# Patient Record
Sex: Male | Born: 1940 | Race: White | Hispanic: No | Marital: Married | Smoking: Never smoker
Health system: Southern US, Community
[De-identification: ages and names within clinical notes are randomized; demographics above are authoritative.]

## PROBLEM LIST (undated history)

## (undated) DIAGNOSIS — E119 Type 2 diabetes mellitus without complications: Secondary | ICD-10-CM

## (undated) DIAGNOSIS — G629 Polyneuropathy, unspecified: Secondary | ICD-10-CM

## (undated) DIAGNOSIS — M5412 Radiculopathy, cervical region: Secondary | ICD-10-CM

## (undated) DIAGNOSIS — I10 Essential (primary) hypertension: Secondary | ICD-10-CM

## (undated) DIAGNOSIS — E039 Hypothyroidism, unspecified: Secondary | ICD-10-CM

## (undated) DIAGNOSIS — M1990 Unspecified osteoarthritis, unspecified site: Secondary | ICD-10-CM

## (undated) DIAGNOSIS — Z9109 Other allergy status, other than to drugs and biological substances: Secondary | ICD-10-CM

## (undated) DIAGNOSIS — N189 Chronic kidney disease, unspecified: Secondary | ICD-10-CM

## (undated) HISTORY — PX: KIDNEY STONE SURGERY: SHX686

## (undated) HISTORY — PX: TONSILLECTOMY: SUR1361

## (undated) HISTORY — PX: COLONOSCOPY W/ POLYPECTOMY: SHX1380

---

## 2000-06-19 ENCOUNTER — Ambulatory Visit (HOSPITAL_COMMUNITY): Admission: RE | Admit: 2000-06-19 | Discharge: 2000-06-19 | Payer: Self-pay | Admitting: *Deleted

## 2004-11-28 ENCOUNTER — Ambulatory Visit: Payer: Self-pay | Admitting: Cardiovascular Disease

## 2004-12-02 ENCOUNTER — Ambulatory Visit: Payer: Self-pay | Admitting: Internal Medicine

## 2004-12-06 ENCOUNTER — Ambulatory Visit: Payer: Self-pay | Admitting: Internal Medicine

## 2010-12-21 ENCOUNTER — Encounter
Admission: RE | Admit: 2010-12-21 | Discharge: 2010-12-21 | Payer: Self-pay | Source: Home / Self Care | Attending: Internal Medicine | Admitting: Internal Medicine

## 2012-02-02 ENCOUNTER — Inpatient Hospital Stay (HOSPITAL_COMMUNITY)
Admission: EM | Admit: 2012-02-02 | Discharge: 2012-02-04 | DRG: 419 | Disposition: A | Discharge status: Home / Self Care | Payer: Medicare Other | Attending: Surgery | Admitting: Surgery

## 2012-02-02 ENCOUNTER — Other Ambulatory Visit: Payer: Self-pay

## 2012-02-02 ENCOUNTER — Encounter (HOSPITAL_COMMUNITY): Payer: Self-pay | Admitting: *Deleted

## 2012-02-02 DIAGNOSIS — Z8719 Personal history of other diseases of the digestive system: Secondary | ICD-10-CM

## 2012-02-02 DIAGNOSIS — I10 Essential (primary) hypertension: Secondary | ICD-10-CM | POA: Diagnosis present

## 2012-02-02 DIAGNOSIS — Z79899 Other long term (current) drug therapy: Secondary | ICD-10-CM

## 2012-02-02 DIAGNOSIS — K8020 Calculus of gallbladder without cholecystitis without obstruction: Secondary | ICD-10-CM

## 2012-02-02 DIAGNOSIS — E876 Hypokalemia: Secondary | ICD-10-CM | POA: Diagnosis present

## 2012-02-02 DIAGNOSIS — Z7982 Long term (current) use of aspirin: Secondary | ICD-10-CM

## 2012-02-02 DIAGNOSIS — K8000 Calculus of gallbladder with acute cholecystitis without obstruction: Principal | ICD-10-CM | POA: Diagnosis present

## 2012-02-02 DIAGNOSIS — Z87442 Personal history of urinary calculi: Secondary | ICD-10-CM

## 2012-02-02 DIAGNOSIS — K859 Acute pancreatitis without necrosis or infection, unspecified: Secondary | ICD-10-CM

## 2012-02-02 HISTORY — DX: Essential (primary) hypertension: I10

## 2012-02-02 LAB — DIFFERENTIAL
Eosinophils Absolute: 0.1 10*3/uL (ref 0.0–0.7)
Eosinophils Relative: 1 % (ref 0–5)
Lymphs Abs: 1.5 10*3/uL (ref 0.7–4.0)
Monocytes Relative: 7 % (ref 3–12)
Neutrophils Relative %: 77 % (ref 43–77)

## 2012-02-02 LAB — CBC
Hemoglobin: 12.9 g/dL — ABNORMAL LOW (ref 13.0–17.0)
MCH: 31.5 pg (ref 26.0–34.0)
MCV: 86.6 fL (ref 78.0–100.0)
RBC: 4.09 MIL/uL — ABNORMAL LOW (ref 4.22–5.81)

## 2012-02-02 MED ORDER — GI COCKTAIL ~~LOC~~
30.0000 mL | Freq: Once | ORAL | Status: AC
  Administered 2012-02-02: 30 mL via ORAL
  Filled 2012-02-02: qty 30

## 2012-02-02 NOTE — ED Notes (Signed)
Pt st's he started having epigastric pain after he ate dinner, around 1800.  Pt st's pain is currently 4/10, no SOB, no nausea, no emesis.  Pt has had diarrhea since Sunday.  Pt in NAD.

## 2012-02-02 NOTE — ED Notes (Signed)
YNW:[id number]<OZ> Expected date:02/02/12<BR> Expected time:10:30 PM<BR> Means of arrival:Ambulance<BR> Comments:<BR> EMS [address] - epigastric pain

## 2012-02-02 NOTE — ED Notes (Signed)
Per EMS:  Pt complaining of epigastric pain, pt has hx of pancreatitis.  Pt started having this pain after dinner, pain radiated to back, no dyspnea, some nausea, no emesis.   Pt describes pain as burning.

## 2012-02-02 NOTE — ED Provider Notes (Signed)
History     CSN: [id number]  Arrival date & time 02/02/12  2249   First MD Initiated Contact with Patient 02/02/12 2324      Chief Complaint  Patient presents with  . Abdominal Pain    (Consider location/radiation/quality/duration/timing/severity/associated sxs/prior treatment) HPI Comments: Pt presents with epigastric pain, starting at 6:30pm tonight, came on suddenly.  Describes as sharp pain.  Was radiating to back.  Was more severe, now mild.  Waxes and wanes. No radiation into chest.  No SOB.  Began after eating a doughnut.  Had hx of gastroenteritis last several days, was better last night and today.  Had "gurgling" feeling to upper abd then, but got better yesterday.  States this feels different than past pancreatitis pain  Patient is a 71 y.o. male presenting with abdominal pain. The history is provided by the patient.  Abdominal Pain The primary symptoms of the illness include abdominal pain and nausea. The primary symptoms of the illness do not include fever, fatigue, shortness of breath, vomiting or diarrhea.  Symptoms associated with the illness do not include chills, diaphoresis, hematuria, frequency or back pain.    Past Medical History  Diagnosis Date  . Hypertension     History reviewed. No pertinent past surgical history.  History reviewed. No pertinent family history.  History  Substance Use Topics  . Smoking status: Not on file  . Smokeless tobacco: Not on file  . Alcohol Use:       Review of Systems  Constitutional: Negative for fever, chills, diaphoresis and fatigue.  HENT: Negative for congestion, rhinorrhea and sneezing.   Eyes: Negative.   Respiratory: Negative for cough, chest tightness and shortness of breath.   Cardiovascular: Negative for chest pain and leg swelling.  Gastrointestinal: Positive for nausea and abdominal pain. Negative for vomiting, diarrhea and blood in stool.  Genitourinary: Negative for frequency, hematuria, flank pain and  difficulty urinating.  Musculoskeletal: Negative for back pain and arthralgias.  Skin: Negative for rash.  Neurological: Negative for dizziness, speech difficulty, weakness, numbness and headaches.    Allergies  Review of patient's allergies indicates no known allergies.  Home Medications   Current Outpatient Rx  Name Route Sig Dispense Refill  . ASPIRIN EC 81 MG PO TBEC Oral Take 81 mg by mouth daily.    Marland Kitchen VALSARTAN-HYDROCHLOROTHIAZIDE 160-12.5 MG PO TABS Oral Take 1 tablet by mouth daily.      BP 149/77  Pulse 100  Temp(Src) 97.6 F (36.4 C) (Oral)  Resp 16  SpO2 98%  Physical Exam  Constitutional: He is oriented to person, place, and time. He appears well-developed and well-nourished.  HENT:  Head: Normocephalic and atraumatic.  Eyes: Pupils are equal, round, and reactive to light.  Neck: Normal range of motion. Neck supple.  Cardiovascular: Normal rate, regular rhythm and normal heart sounds.   Pulmonary/Chest: Effort normal and breath sounds normal. No respiratory distress. He has no wheezes. He has no rales. He exhibits no tenderness.  Abdominal: Soft. Bowel sounds are normal. There is tenderness. There is no rebound and no guarding.       Mild tenderness to epigastric area.  No pain to RUQ  Musculoskeletal: Normal range of motion. He exhibits no edema.  Lymphadenopathy:    He has no cervical adenopathy.  Neurological: He is alert and oriented to person, place, and time.  Skin: Skin is warm and dry. No rash noted.  Psychiatric: He has a normal mood and affect.    ED Course  Procedures (including critical care time)  Results for orders placed during the hospital encounter of 02/02/12  CBC      Component Value Range   WBC 10.0  4.0 - 10.5 (K/uL)   RBC 4.09 (*) 4.22 - 5.81 (MIL/uL)   Hemoglobin 12.9 (*) 13.0 - 17.0 (g/dL)   HCT 40.9 (*) 81.1 - 52.0 (%)   MCV 86.6  78.0 - 100.0 (fL)   MCH 31.5  26.0 - 34.0 (pg)   MCHC 36.4 (*) 30.0 - 36.0 (g/dL)   RDW 91.4   78.2 - 95.6 (%)   Platelets 266  150 - 400 (K/uL)  DIFFERENTIAL      Component Value Range   Neutrophils Relative 77  43 - 77 (%)   Neutro Abs 7.7  1.7 - 7.7 (K/uL)   Lymphocytes Relative 15  12 - 46 (%)   Lymphs Abs 1.5  0.7 - 4.0 (K/uL)   Monocytes Relative 7  3 - 12 (%)   Monocytes Absolute 0.7  0.1 - 1.0 (K/uL)   Eosinophils Relative 1  0 - 5 (%)   Eosinophils Absolute 0.1  0.0 - 0.7 (K/uL)   Basophils Relative 0  0 - 1 (%)   Basophils Absolute 0.0  0.0 - 0.1 (K/uL)  COMPREHENSIVE METABOLIC PANEL      Component Value Range   Sodium 132 (*) 135 - 145 (mEq/L)   Potassium 3.0 (*) 3.5 - 5.1 (mEq/L)   Chloride 96  96 - 112 (mEq/L)   CO2 25  19 - 32 (mEq/L)   Glucose, Bld 158 (*) 70 - 99 (mg/dL)   BUN 25 (*) 6 - 23 (mg/dL)   Creatinine, Ser 2.13 (*) 0.50 - 1.35 (mg/dL)   Calcium 8.9  8.4 - 08.6 (mg/dL)   Total Protein 6.9  6.0 - 8.3 (g/dL)   Albumin 3.3 (*) 3.5 - 5.2 (g/dL)   AST 26  0 - 37 (U/L)   ALT 30  0 - 53 (U/L)   Alkaline Phosphatase 102  39 - 117 (U/L)   Total Bilirubin 0.2 (*) 0.3 - 1.2 (mg/dL)   GFR calc non Af Amer 48 (*) >90 (mL/min)   GFR calc Af Amer 56 (*) >90 (mL/min)  LIPASE, BLOOD      Component Value Range   Lipase 108 (*) 11 - 59 (U/L)  URINALYSIS, ROUTINE W REFLEX MICROSCOPIC      Component Value Range   Color, Urine YELLOW  YELLOW    APPearance CLEAR  CLEAR    Specific Gravity, Urine 1.030  1.005 - 1.030    pH 6.0  5.0 - 8.0    Glucose, UA NEGATIVE  NEGATIVE (mg/dL)   Hgb urine dipstick TRACE (*) NEGATIVE    Bilirubin Urine NEGATIVE  NEGATIVE    Ketones, ur NEGATIVE  NEGATIVE (mg/dL)   Protein, ur 30 (*) NEGATIVE (mg/dL)   Urobilinogen, UA 0.2  0.0 - 1.0 (mg/dL)   Nitrite NEGATIVE  NEGATIVE    Leukocytes, UA NEGATIVE  NEGATIVE   URINE MICROSCOPIC-ADD ON      Component Value Range   WBC, UA 0-2  <3 (WBC/hpf)   RBC / HPF 0-2  <3 (RBC/hpf)    Date: 02/03/2012  Rate: 98  Rhythm: normal sinus rhythm  QRS Axis: normal  Intervals: PR prolonged   ST/T Wave abnormalities: nonspecific ST/T changes  Conduction Disutrbances:first-degree A-V block   Narrative Interpretation:   Old EKG Reviewed: none available   Ct Abdomen Pelvis W Contrast  02/03/2012  *  RADIOLOGY REPORT*  Clinical Data: Upper abdominal pain.  CT ABDOMEN AND PELVIS WITH CONTRAST  Technique:  Multidetector CT imaging of the abdomen and pelvis was performed following the standard protocol during bolus administration of intravenous contrast.  Contrast: OMNIPAQUE IOHEXOL 300 MG/ML IV SOLN  Comparison: None.  Findings: Lung bases are clear.  No effusions.  Heart is normal size.  Small gallstones are seen within the gallbladder.  One of the gallstones appears to be in the gallbladder neck.  Liver, spleen, adrenals are unremarkable.  Small nonobstructing stones in the kidneys bilaterally.  No hydronephrosis or ureteral stones. Urinary bladder is unremarkable.  Pancreatic duct is diffusely dilated, measuring maximally 16 mm in the pancreatic body.  Overlying pancreatic atrophy.  Findings most likely related to prior bouts of pancreatitis.  No changes to suggest acute pancreatitis.  Appendix is visualized and is normal. Bowel grossly unremarkable. No free fluid, free air, or adenopathy.  Bilateral inguinal hernias containing fat.  Degenerative changes in the lower lumbar spine.  No acute bony abnormality.  IMPRESSION: Cholelithiasis.  A small stone is in the region of the gallbladder neck.  Nephrolithiasis.  Changes within the pancreas suggesting prior bouts of pancreatitis. No acute pancreatitis changes.  Original Report Authenticated By: Cyndie Chime, M.D.     1. Gallstone   2. Pancreatitis       MDM  Discussed finding with surgeon, Dr Ezzard Standing, who will admit pt and remove gallbladder        Rolan Bucco, MD 02/03/12 [phone]

## 2012-02-03 ENCOUNTER — Emergency Department (HOSPITAL_COMMUNITY): Payer: Medicare Other | Admitting: Anesthesiology

## 2012-02-03 ENCOUNTER — Encounter (HOSPITAL_COMMUNITY)
Admission: EM | Disposition: A | Discharge status: Home / Self Care | Payer: Self-pay | Source: Home / Self Care | Attending: Surgery

## 2012-02-03 ENCOUNTER — Emergency Department (HOSPITAL_COMMUNITY): Payer: Medicare Other

## 2012-02-03 ENCOUNTER — Encounter (HOSPITAL_COMMUNITY): Payer: Self-pay | Admitting: Anesthesiology

## 2012-02-03 ENCOUNTER — Encounter (HOSPITAL_COMMUNITY): Payer: Self-pay

## 2012-02-03 ENCOUNTER — Other Ambulatory Visit (INDEPENDENT_AMBULATORY_CARE_PROVIDER_SITE_OTHER): Payer: Self-pay | Admitting: Surgery

## 2012-02-03 DIAGNOSIS — K8010 Calculus of gallbladder with chronic cholecystitis without obstruction: Secondary | ICD-10-CM

## 2012-02-03 HISTORY — PX: CHOLECYSTECTOMY: SHX55

## 2012-02-03 LAB — URINALYSIS, ROUTINE W REFLEX MICROSCOPIC
Bilirubin Urine: NEGATIVE
Ketones, ur: NEGATIVE mg/dL
Nitrite: NEGATIVE
Protein, ur: 30 mg/dL — AB
Specific Gravity, Urine: 1.03 (ref 1.005–1.030)
Urobilinogen, UA: 0.2 mg/dL (ref 0.0–1.0)

## 2012-02-03 LAB — COMPREHENSIVE METABOLIC PANEL
Alkaline Phosphatase: 102 U/L (ref 39–117)
BUN: 25 mg/dL — ABNORMAL HIGH (ref 6–23)
GFR calc Af Amer: 56 mL/min — ABNORMAL LOW (ref >90)
GFR calc non Af Amer: 48 mL/min — ABNORMAL LOW (ref >90)
Glucose, Bld: 158 mg/dL — ABNORMAL HIGH (ref 70–99)
Potassium: 3 mEq/L — ABNORMAL LOW (ref 3.5–5.1)
Total Protein: 6.9 g/dL (ref 6.0–8.3)

## 2012-02-03 LAB — URINE MICROSCOPIC-ADD ON

## 2012-02-03 LAB — LIPASE, BLOOD: Lipase: 108 U/L — ABNORMAL HIGH (ref 11–59)

## 2012-02-03 SURGERY — LAPAROSCOPIC CHOLECYSTECTOMY WITH INTRAOPERATIVE CHOLANGIOGRAM
Anesthesia: General | Site: Abdomen | Wound class: Clean Contaminated

## 2012-02-03 MED ORDER — LACTATED RINGERS IV SOLN
INTRAVENOUS | Status: DC | PRN
  Administered 2012-02-03 (×2): via INTRAVENOUS

## 2012-02-03 MED ORDER — IOHEXOL 300 MG/ML  SOLN
100.0000 mL | Freq: Once | INTRAMUSCULAR | Status: AC | PRN
  Administered 2012-02-03: 100 mL via INTRAVENOUS

## 2012-02-03 MED ORDER — ACETAMINOPHEN 10 MG/ML IV SOLN
INTRAVENOUS | Status: AC
  Filled 2012-02-03: qty 100

## 2012-02-03 MED ORDER — ONDANSETRON HCL 4 MG/2ML IJ SOLN
INTRAMUSCULAR | Status: DC | PRN
  Administered 2012-02-03: 4 mg via INTRAVENOUS

## 2012-02-03 MED ORDER — MORPHINE SULFATE 4 MG/ML IJ SOLN
4.0000 mg | Freq: Once | INTRAMUSCULAR | Status: AC
  Administered 2012-02-03: 4 mg via INTRAVENOUS
  Filled 2012-02-03: qty 1

## 2012-02-03 MED ORDER — ROCURONIUM BROMIDE 100 MG/10ML IV SOLN
INTRAVENOUS | Status: DC | PRN
  Administered 2012-02-03: 25 mg via INTRAVENOUS

## 2012-02-03 MED ORDER — ONDANSETRON HCL 4 MG/2ML IJ SOLN
4.0000 mg | Freq: Once | INTRAMUSCULAR | Status: AC
  Administered 2012-02-03: 4 mg via INTRAVENOUS
  Filled 2012-02-03: qty 2

## 2012-02-03 MED ORDER — HYDROMORPHONE HCL PF 1 MG/ML IJ SOLN: 0.2500 mg | INTRAMUSCULAR | Status: DC | PRN

## 2012-02-03 MED ORDER — ONDANSETRON HCL 4 MG/2ML IJ SOLN
4.0000 mg | Freq: Four times a day (QID) | INTRAMUSCULAR | Status: DC | PRN
  Administered 2012-02-03 (×2): 4 mg via INTRAVENOUS
  Filled 2012-02-03: qty 2

## 2012-02-03 MED ORDER — ACETAMINOPHEN 10 MG/ML IV SOLN
INTRAVENOUS | Status: DC | PRN
  Administered 2012-02-03: 1000 mg via INTRAVENOUS

## 2012-02-03 MED ORDER — PROMETHAZINE HCL 25 MG/ML IJ SOLN: 6.2500 mg | INTRAMUSCULAR | Status: DC | PRN

## 2012-02-03 MED ORDER — ACETAMINOPHEN 325 MG PO TABS: 650.0000 mg | ORAL_TABLET | ORAL | Status: DC | PRN

## 2012-02-03 MED ORDER — CEFAZOLIN SODIUM 1-5 GM-% IV SOLN
1.0000 g | Freq: Four times a day (QID) | INTRAVENOUS | Status: AC
  Administered 2012-02-03 – 2012-02-04 (×3): 1 g via INTRAVENOUS
  Filled 2012-02-03 (×3): qty 50

## 2012-02-03 MED ORDER — EPHEDRINE SULFATE 50 MG/ML IJ SOLN
INTRAMUSCULAR | Status: DC | PRN
  Administered 2012-02-03: 5 mg via INTRAVENOUS

## 2012-02-03 MED ORDER — POTASSIUM CHLORIDE CRYS ER 20 MEQ PO TBCR
40.0000 meq | EXTENDED_RELEASE_TABLET | Freq: Once | ORAL | Status: AC
  Administered 2012-02-03: 40 meq via ORAL
  Filled 2012-02-03: qty 2

## 2012-02-03 MED ORDER — PROPOFOL 10 MG/ML IV EMUL
INTRAVENOUS | Status: DC | PRN
  Administered 2012-02-03: 120 mg via INTRAVENOUS

## 2012-02-03 MED ORDER — IOHEXOL 300 MG/ML  SOLN
INTRAMUSCULAR | Status: DC | PRN
  Administered 2012-02-03: 50 mL

## 2012-02-03 MED ORDER — SODIUM CHLORIDE 0.9 % IR SOLN
Status: DC | PRN
  Administered 2012-02-03: 1000 mL

## 2012-02-03 MED ORDER — MORPHINE SULFATE 2 MG/ML IJ SOLN: 1.0000 mg | INTRAMUSCULAR | Status: DC | PRN

## 2012-02-03 MED ORDER — SODIUM CHLORIDE 0.9 % IV BOLUS (SEPSIS)
500.0000 mL | Freq: Once | INTRAVENOUS | Status: AC
  Administered 2012-02-03: 500 mL via INTRAVENOUS

## 2012-02-03 MED ORDER — CEFAZOLIN SODIUM 1-5 GM-% IV SOLN
INTRAVENOUS | Status: DC | PRN
  Administered 2012-02-03: 1 g via INTRAVENOUS

## 2012-02-03 MED ORDER — BUPIVACAINE HCL (PF) 0.25 % IJ SOLN
INTRAMUSCULAR | Status: AC
  Filled 2012-02-03: qty 30

## 2012-02-03 MED ORDER — BUPIVACAINE HCL (PF) 0.25 % IJ SOLN
INTRAMUSCULAR | Status: DC | PRN
  Administered 2012-02-03: 22 mL

## 2012-02-03 MED ORDER — NEOSTIGMINE METHYLSULFATE 1 MG/ML IJ SOLN
INTRAMUSCULAR | Status: DC | PRN
  Administered 2012-02-03: 3.5 mg via INTRAVENOUS

## 2012-02-03 MED ORDER — IOHEXOL 300 MG/ML  SOLN
INTRAMUSCULAR | Status: AC
  Filled 2012-02-03: qty 1

## 2012-02-03 MED ORDER — FENTANYL CITRATE 0.05 MG/ML IJ SOLN
INTRAMUSCULAR | Status: DC | PRN
  Administered 2012-02-03 (×2): 50 ug via INTRAVENOUS
  Administered 2012-02-03: 100 ug via INTRAVENOUS

## 2012-02-03 MED ORDER — POTASSIUM CHLORIDE IN NACL 20-0.45 MEQ/L-% IV SOLN
INTRAVENOUS | Status: DC
  Administered 2012-02-03 – 2012-02-04 (×2): via INTRAVENOUS
  Filled 2012-02-03 (×7): qty 1000

## 2012-02-03 MED ORDER — ONDANSETRON HCL 4 MG PO TABS: 4.0000 mg | ORAL_TABLET | Freq: Four times a day (QID) | ORAL | Status: DC | PRN

## 2012-02-03 MED ORDER — LIDOCAINE HCL (CARDIAC) 20 MG/ML IV SOLN
INTRAVENOUS | Status: DC | PRN
  Administered 2012-02-03: 60 mg via INTRAVENOUS

## 2012-02-03 MED ORDER — VALSARTAN-HYDROCHLOROTHIAZIDE 160-12.5 MG PO TABS: 1.0000 | ORAL_TABLET | Freq: Every day | ORAL | Status: DC

## 2012-02-03 MED ORDER — HEPARIN SODIUM (PORCINE) 5000 UNIT/ML IJ SOLN
5000.0000 [IU] | Freq: Three times a day (TID) | INTRAMUSCULAR | Status: DC
  Administered 2012-02-03 – 2012-02-04 (×2): 5000 [IU] via SUBCUTANEOUS
  Filled 2012-02-03 (×5): qty 1

## 2012-02-03 MED ORDER — GLYCOPYRROLATE 0.2 MG/ML IJ SOLN
INTRAMUSCULAR | Status: DC | PRN
  Administered 2012-02-03: .5 mg via INTRAVENOUS

## 2012-02-03 MED ORDER — SUCCINYLCHOLINE CHLORIDE 20 MG/ML IJ SOLN
INTRAMUSCULAR | Status: DC | PRN
  Administered 2012-02-03: 100 mg via INTRAVENOUS

## 2012-02-03 MED ORDER — HYDROCODONE-ACETAMINOPHEN 5-325 MG PO TABS: 1.0000 | ORAL_TABLET | ORAL | Status: DC | PRN

## 2012-02-03 MED ORDER — LACTATED RINGERS IR SOLN
Status: DC | PRN
  Administered 2012-02-03: 1000 mL

## 2012-02-03 MED ORDER — ONDANSETRON HCL 4 MG/2ML IJ SOLN
INTRAMUSCULAR | Status: AC
  Filled 2012-02-03: qty 2

## 2012-02-03 MED ORDER — HYDROCHLOROTHIAZIDE 12.5 MG PO CAPS
12.5000 mg | ORAL_CAPSULE | Freq: Every day | ORAL | Status: DC
  Administered 2012-02-03: 12.5 mg via ORAL
  Filled 2012-02-03 (×2): qty 1

## 2012-02-03 MED ORDER — CEFAZOLIN SODIUM 1-5 GM-% IV SOLN
INTRAVENOUS | Status: AC
  Filled 2012-02-03: qty 50

## 2012-02-03 MED ORDER — KETOROLAC TROMETHAMINE 30 MG/ML IJ SOLN: 15.0000 mg | Freq: Once | INTRAMUSCULAR | Status: DC | PRN

## 2012-02-03 MED ORDER — IRBESARTAN 150 MG PO TABS
150.0000 mg | ORAL_TABLET | Freq: Every day | ORAL | Status: DC
  Administered 2012-02-03: 150 mg via ORAL
  Filled 2012-02-03 (×2): qty 1

## 2012-02-03 SURGICAL SUPPLY — 48 items
ADH SKN CLS APL DERMABOND .7 (GAUZE/BANDAGES/DRESSINGS)
APL SKNCLS STERI-STRIP NONHPOA (GAUZE/BANDAGES/DRESSINGS)
APPLIER CLIP ROT 10 11.4 M/L (STAPLE) ×2
APR CLP MED LRG 11.4X10 (STAPLE) ×1
BAG SPEC RTRVL LRG 6X4 10 (ENDOMECHANICALS) ×1
BENZOIN TINCTURE PRP APPL 2/3 (GAUZE/BANDAGES/DRESSINGS) ×1 IMPLANT
CABLE HI FREQUENCY MONOPOLAR (ELECTROSURGICAL) ×1 IMPLANT
CANISTER SUCTION 2500CC (MISCELLANEOUS) ×2 IMPLANT
CHLORAPREP W/TINT 26ML (MISCELLANEOUS) ×2 IMPLANT
CHOLANGIOGRAM CATH TAUT (CATHETERS) ×2 IMPLANT
CLIP APPLIE ROT 10 11.4 M/L (STAPLE) ×1 IMPLANT
CLOTH BEACON ORANGE TIMEOUT ST (SAFETY) ×2 IMPLANT
COVER MAYO STAND STRL (DRAPES) ×1 IMPLANT
DECANTER SPIKE VIAL GLASS SM (MISCELLANEOUS) ×2 IMPLANT
DERMABOND ADVANCED (GAUZE/BANDAGES/DRESSINGS)
DERMABOND ADVANCED .7 DNX12 (GAUZE/BANDAGES/DRESSINGS) IMPLANT
DRAPE C-ARM 42X72 X-RAY (DRAPES) ×1 IMPLANT
DRAPE LAPAROSCOPIC ABDOMINAL (DRAPES) ×2 IMPLANT
ELECT REM PT RETURN 9FT ADLT (ELECTROSURGICAL) ×2
ELECTRODE REM PT RTRN 9FT ADLT (ELECTROSURGICAL) ×1 IMPLANT
GLOVE BIOGEL PI IND STRL 7.0 (GLOVE) ×1 IMPLANT
GLOVE BIOGEL PI INDICATOR 7.0 (GLOVE) ×1
GLOVE SURG SIGNA 7.5 PF LTX (GLOVE) ×2 IMPLANT
GOWN STRL NON-REIN LRG LVL3 (GOWN DISPOSABLE) ×2 IMPLANT
GOWN STRL REIN XL XLG (GOWN DISPOSABLE) ×4 IMPLANT
HEMOSTAT SURGICEL 4X8 (HEMOSTASIS) ×1 IMPLANT
IV CATH 14GX2 1/4 (CATHETERS) ×2 IMPLANT
IV SET EXT 30 76VOL 4 MALE LL (IV SETS) ×2 IMPLANT
KIT BASIN OR (CUSTOM PROCEDURE TRAY) ×2 IMPLANT
NS IRRIG 1000ML POUR BTL (IV SOLUTION) IMPLANT
POUCH SPECIMEN RETRIEVAL 10MM (ENDOMECHANICALS) ×1 IMPLANT
SCISSORS MNPLR CVD DVNC (INSTRUMENTS) IMPLANT
SCISSORS MONOPOLAR CVD (INSTRUMENTS) ×1
SET IRRIG TUBING LAPAROSCOPIC (IRRIGATION / IRRIGATOR) ×2 IMPLANT
SLEEVE Z-THREAD 5X100MM (TROCAR) IMPLANT
SOLUTION ANTI FOG 6CC (MISCELLANEOUS) ×2 IMPLANT
SOLUTION ELECTROLUBE (MISCELLANEOUS) ×1 IMPLANT
STOPCOCK K 69 2C6206 (IV SETS) ×2 IMPLANT
STRIP CLOSURE SKIN 1/4X4 (GAUZE/BANDAGES/DRESSINGS) ×1 IMPLANT
SUT VIC AB 5-0 PS2 18 (SUTURE) ×2 IMPLANT
TOWEL OR 17X26 10 PK STRL BLUE (TOWEL DISPOSABLE) ×6 IMPLANT
TRAY LAP CHOLE (CUSTOM PROCEDURE TRAY) ×2 IMPLANT
TROCAR XCEL BLUNT TIP 100MML (ENDOMECHANICALS) ×2 IMPLANT
TROCAR Z-THREAD FIOS 11X100 BL (TROCAR) ×2 IMPLANT
TROCAR Z-THREAD FIOS 5X100MM (TROCAR) ×3 IMPLANT
TROCAR Z-THREAD SLEEVE 11X100 (TROCAR) ×1 IMPLANT
TUBING INSUFFLATION 10FT LAP (TUBING) ×2 IMPLANT
WATER STERILE IRR 1500ML POUR (IV SOLUTION) ×2 IMPLANT

## 2012-02-03 NOTE — ED Notes (Signed)
Introduced self to patient  Alert oriented x3.  Getting ready to transfer to OR, chg bath done. No complaints at this time.

## 2012-02-03 NOTE — ED Notes (Signed)
Pt brought over from ED and placed in room 31 and made comfortable with wife at bedside awaiting surgery to consult.

## 2012-02-03 NOTE — Preoperative (Signed)
Beta Blockers   Reason not to administer Beta Blockers:Not Applicable 

## 2012-02-03 NOTE — Transfer of Care (Signed)
Immediate Anesthesia Transfer of Care Note  Patient: Terrence Cisneros  Procedure(s) Performed: Procedure(s) (LRB): LAPAROSCOPIC CHOLECYSTECTOMY WITH INTRAOPERATIVE CHOLANGIOGRAM (N/A)  Patient Location: PACU  Anesthesia Type: General  Level of Consciousness: oriented, sedated, patient cooperative and responds to stimulation  Airway & Oxygen Therapy: Patient Spontanous Breathing and Patient connected to face mask oxygen  Post-op Assessment: Report given to PACU RN, Post -op Vital signs reviewed and stable and Patient moving all extremities  Post vital signs: Reviewed and stable  Complications: No apparent anesthesia complications

## 2012-02-03 NOTE — Anesthesia Postprocedure Evaluation (Signed)
  Anesthesia Post-op Note  Patient: Terrence Cisneros  Procedure(s) Performed: Procedure(s) (LRB): LAPAROSCOPIC CHOLECYSTECTOMY WITH INTRAOPERATIVE CHOLANGIOGRAM (N/A)  Patient Location: PACU  Anesthesia Type: General  Level of Consciousness: awake, alert , oriented, patient cooperative and responds to stimulation  Airway and Oxygen Therapy: Patient Spontanous Breathing and Patient connected to nasal cannula oxygen  Post-op Pain: mild  Post-op Assessment: Post-op Vital signs reviewed, Patient's Cardiovascular Status Stable, Respiratory Function Stable, Patent Airway, No signs of Nausea or vomiting, Adequate PO intake and Pain level controlled  Post-op Vital Signs: Reviewed and stable  Complications: No apparent anesthesia complications

## 2012-02-03 NOTE — Brief Op Note (Signed)
02/02/2012 - 02/03/2012  9:32 AM  PATIENT:  Alwyn Pea, 71 y.o., male, MRN: [id number]  PREOP DIAGNOSIS:  gallstones  POSTOP DIAGNOSIS:   Acute cholecystitis, cholelithiasis.  PROCEDURE:   Procedure(s): LAPAROSCOPIC CHOLECYSTECTOMY WITH INTRAOPERATIVE CHOLANGIOGRAM  SURGEON:   Ovidio Kin, M.D.  ASSISTANTJohna Sheriff, M.D>  ANESTHESIA:   general  Riesa Pope, MD - Anesthesiologist Zebedee Iba, CRNA - CRNA  General  EBL:  75  ml  BLOOD ADMINISTERED: none  DRAINS: none   LOCAL MEDICATIONS USED:   25 cc 1/4% marcaine  SPECIMEN:   Gall bladder  COUNTS CORRECT:  YES  INDICATIONS FOR PROCEDURE:  KIEL COCKERELL is a 71 y.o. (DOB: 1941-03-12) white male whose primary care physician is No primary provider on file. and comes for lap chole with IOC.  He came through the [facility].   The indications and risks of the surgery were explained to the patient.  The risks include, but are not limited to, infection, bleeding, and nerve injury.  Note dictated to:   [phone]

## 2012-02-03 NOTE — Anesthesia Preprocedure Evaluation (Addendum)
Anesthesia Evaluation  Patient identified by MRN, date of birth, ID band Patient awake    Reviewed: Allergy & Precautions, H&P , NPO status , Patient's Chart, lab work & pertinent test results  Airway Mallampati: III TM Distance: <3 FB Neck ROM: Full    Dental No notable dental hx. (+) Caps,    Pulmonary neg pulmonary ROS,  clear to auscultation  Pulmonary exam normal       Cardiovascular hypertension, Pt. on medications Regular Normal    Neuro/Psych Negative Neurological ROS  Negative Psych ROS   GI/Hepatic negative GI ROS, Neg liver ROS,   Endo/Other  Negative Endocrine ROS  Renal/GU negative Renal ROS  Genitourinary negative   Musculoskeletal negative musculoskeletal ROS (+)   Abdominal   Peds negative pediatric ROS (+)  Hematology negative hematology ROS (+)   Anesthesia Other Findings   Reproductive/Obstetrics negative OB ROS                          Anesthesia Physical Anesthesia Plan  ASA: II  Anesthesia Plan: General   Post-op Pain Management:    Induction: Intravenous  Airway Management Planned: Oral ETT  Additional Equipment:   Intra-op Plan:   Post-operative Plan: Extubation in OR  Informed Consent: I have reviewed the patients History and Physical, chart, labs and discussed the procedure including the risks, benefits and alternatives for the proposed anesthesia with the patient or authorized representative who has indicated his/her understanding and acceptance.   Dental advisory given  Plan Discussed with: CRNA  Anesthesia Plan Comments:         Anesthesia Quick Evaluation

## 2012-02-03 NOTE — H&P (Signed)
Re:   KENT RIENDEAU DOB:   Jan 12, 1941 MRN:   [id number]  ASSESSMENT AND PLAN: 1.  Symptomatic gallstones.  I discussed with the patient the indications and risks of gall bladder surgery.  The primary risks of gall bladder surgery include, but are not limited to, bleeding, infection, common bile duct injury, and open surgery.  There is also the risk that the patient may have continued symptoms after surgery.  However, the likelihood of improvement in symptoms and return to the patient's normal status is good. We discussed the typical post-operative recovery course. I tried to answer the patient's questions.  I drew a picture of the surgery for the patient.  His wife was at the bed side with him.  Will proceed with surgery this AM.  2.  History of pancreatitis.  Hospitalized in [place].  Etiology never found 3.  Hypertension x 2 years. 4.  History of kidney stones.  Creatinine - 1.43. 5.  Hypokalemia.  To replace.  Chief Complaint  Patient presents with  . Abdominal Pain   REFERRING PHYSICIAN:  Dr. Fredderick Phenix, Janyce Llanos  HISTORY OF PRESENT ILLNESS: Terrence Cisneros is a 71 y.o. (DOB: 1941-03-01)  white male whose primary care physician is Dr. Burney Gauze and comes to [facility] with abdominal pain.  The patient was hospitalized in [place] for pancreatitis in 2011.  The cause of the pancreatitis was not discovered .  He does not drink.  He has had the intestinal flu the past week, but was getting better.  He then developed acute epigastric pain at 6:30 PM yesterday and was taken by ambulance from [facility] to [facility].  Dr. Fredderick Phenix called me in consultation.  Past Medical History  Diagnosis Date  . Hypertension      History reviewed. No pertinent past surgical history.    Current Facility-Administered Medications  Medication Dose Route Frequency Provider Last Rate Last Dose  . gi cocktail  30 mL Oral Once Rolan Bucco, MD   30 mL at 02/02/12 2354  . iohexol (OMNIPAQUE) 300 MG/ML solution 100 mL   100 mL Intravenous Once PRN Medication Radiologist, MD   100 mL at 02/03/12 0324  . morphine 4 MG/ML injection 4 mg  4 mg Intravenous Once Rolan Bucco, MD   4 mg at 02/03/12 0058  . ondansetron (ZOFRAN) injection 4 mg  4 mg Intravenous Once Rolan Bucco, MD   4 mg at 02/03/12 0058  . potassium chloride SA (K-DUR,KLOR-CON) CR tablet 40 mEq  40 mEq Oral Once Rolan Bucco, MD   40 mEq at 02/03/12 0158  . sodium chloride 0.9 % bolus 500 mL  500 mL Intravenous Once Rolan Bucco, MD   500 mL at 02/03/12 7829   Current Outpatient Prescriptions  Medication Sig Dispense Refill  . aspirin EC 81 MG tablet Take 81 mg by mouth daily.      . valsartan-hydrochlorothiazide (DIOVAN-HCT) 160-12.5 MG per tablet Take 1 tablet by mouth daily.         No Known Allergies  REVIEW OF SYSTEMS: Skin:  No history of rash.  No history of abnormal moles. Infection:  No history of hepatitis or HIV.  No history of MRSA. Neurologic:  No history of stroke.  No history of seizure.  No history of headaches. Cardiac:  Hypertension x 2 years. No history of heart disease.  No history of prior cardiac catheterization.  No history of seeing a cardiologist. Pulmonary:  Does not smoke cigarettes.  No asthma or bronchitis.  No OSA/CPAP.  Endocrine:  No diabetes. No thyroid disease. Gastrointestinal: See HPI. Urologic: History of kidney stones. Musculoskeletal:  No history of joint or back disease. Hematologic:  No bleeding disorder.  No history of anemia.  Not anticoagulated. Psycho-social:  The patient is oriented.   The patient has no obvious psychologic or social impairment to understanding our conversation and plan.  SOCIAL and FAMILY HISTORY: Married.  Wife at bedside. Works in a garage. Has 2 children.  PHYSICAL EXAM: BP 124/77  Pulse 77  Temp(Src) 98.7 F (37.1 C) (Oral)  Resp 18  SpO2 100%  General: WN older WM who is alert and generally healthy appearing.  HEENT: Normal. Pupils equal. Good  dentition. Neck: Supple. No mass.  No thyroid mass.  Carotid pulse okay with no bruit. Lymph Nodes:  No supraclavicular or cervical nodes. Lungs: Clear to auscultation and symmetric breath sounds. Heart:  RRR. No murmur or rub.  Abdomen: Soft. No mass. No tenderness at this time. No hernia. Normal bowel sounds.  No abdominal scars. Rectal: Not done. Extremities:  Good strength and ROM  in upper and lower extremities. Neurologic:  Grossly intact to motor and sensory function. Psychiatric: Has normal mood and affect. Behavior is normal.   DATA REVIEWED: CT scan shows small stones, ? Impacted in neck of GB. WBC - 10,000 Lipase - 108 K+ - 3.0  Ovidio Kin, MD,  [facility], [place] [address] [place]  [address]   [place], [place]    [address] Phone:  [phone] FAX:  [phone]

## 2012-02-04 MED ORDER — HYDROCODONE-ACETAMINOPHEN 5-325 MG PO TABS: 1.0000 | ORAL_TABLET | Freq: Four times a day (QID) | ORAL | Status: AC | PRN

## 2012-02-04 NOTE — Progress Notes (Signed)
Assessment unchanged. Pt and wife verbalized understanding of dc instructions. Script x1 with pt as provided by MD. Discharged via wheelchair to front entrance to meet awaiting vehicle to carry home. Accompanied by wife and nurse tech.

## 2012-02-04 NOTE — Discharge Instructions (Signed)
DISCHARGE INSTRUCTIONS TO PATIENT  Return to work on:  1 - 2 weeks  Activity:  Driving - may drive in 3 to 4 days if doing well.   Lifting - no lifting >15 pounds for about 1 week  Wound Care:   May shower starting tomorrow  Diet:  Low fat  Follow up appointment:  Call Dr. Allene Pyo office [facility]) at [phone] for an appointment in 2 - 3 weeks.  Medications and dosages:  Resume your home medications.  You have a prescription for:  Vicodin  Call Dr. Ezzard Standing or his office  [phone]) if you have:  Temperature greater than 100.4,  Persistent nausea and vomiting,  Severe uncontrolled pain,  Redness, tenderness, or signs of infection (pain, swelling, redness, odor or green/yellow discharge around the site),  Difficulty breathing, headache or visual disturbances,  Any other questions or concerns you may have after discharge.  In an emergency, call 911 or go to an Emergency Department at a nearby hospital.

## 2012-02-04 NOTE — Op Note (Signed)
NAMEMarland Kitchen  ALAN, DRUMMER NO.:  [id number]  MEDICAL RECORD NO.:  [id number]  LOCATION:  1523                         FACILITY:  [facility]  PHYSICIAN:  Sandria Bales. Ezzard Standing, M.D.  DATE OF BIRTH:  1941-08-13  DATE OF PROCEDURE:  02/03/2012                              OPERATIVE REPORT  PREOPERATIVE DIAGNOSES: 1. Cholecystitis. 2. Cholelithiasis.  POSTOPERATIVE DIAGNOSES: 1. Acute cholecystitis. 2. Cholelithiasis.  PROCEDURES:  Laparoscopic cholecystectomy with intraoperative cholangiogram.  SURGEONS:  Sandria Bales. Ezzard Standing, M.D.  FIRST ASSISTANT:  Sharlet Salina T. Hoxworth, M.D.  ANESTHESIA:  General endotracheal.  ESTIMATED BLOOD LOSS:  75 mL.  DRAINS:  Left in were none.  INDICATION OF PROCEDURE:  Mr. Delange is a 71 year old white male, who sees Dr. Georgann Housekeeper, his primary medical doctor.  He had a pancreatitis, hospitalized in [place] about 2 years ago, with not clear diagnosis.  He presented acutely last evening to the [facility] Emergency Room via EMS with epigastric abdominal pain.  Evaluation by Dr. Fredderick Phenix revealed he had cholelithiasis with apparent stones on the neck of his gallbladder.  Discussed with the patient and his wife about proceeding with laparoscopic cholecystectomy.  I discussed the indications, potential risks of surgery.  Potential risks of surgery include but are not limited to bleeding, infection, bowel injury, and open surgery.  OPERATIVE NOTE:  The patient placed in room #1 under general anesthesia supervised by Dr. Eilene Ghazi.  He was given 1 g of Ancef at the initiation of procedure.  He had his abdomen prepped with ChloraPrep and sterilely draped.  Time-out was held, the surgical checklist run.  I accessed the abdominal cavity through an infraumbilical incision with sharp dissection carried down to the abdominal cavity.  A 0 degree 10 mm laparoscope was inserted through a 12 mm Hasson trocar and the Hasson trocar secured with a 0  Vicryl suture.  I then switched to an angled scope to better go look at the gallbladder.  His liver edge rather angling down towards right shoulder and the gallbladder was high.  I placed 3 additional trocars, a 11 mm subxiphoid trocar, 5 mm right mid subcostal and 5 mm lateral subcostal trocar.  The gallbladder was somewhat tensed and distended.  I decompressed the gall bladder.  I grabbed the gallbladder and rotated it cephalad.  I did see he had a lot of fat around the lower pole of the gallbladder with evidence of edema, all consistent with acute cholecystitis.  I dissected down and identified the cystic duct, gallbladder junction and the cholecystic artery.  I placed a clip on the cystic duct, 3 clips on the cystic artery and shot an intraoperative cholangiogram.  Intraoperative cholangiogram was shot using a cutoff taut catheter, inserted through a 14-gauge Jelco into the side of the cut cystic duct and secured with an Endoclip.  I used about 8 mL of half-strength Hypaque solution, which showed free flow down the cystic duct, into the common bile duct, which was small.  I went up to hepatic radicals and easily flowed into the duodenum, which looked like the duct into the third or fourth portion of the duodenum.  I then removed the taut catheter, Endoclipped the  cystic duct 3 times, and divided the cystic artery, then sharply and bluntly dissected the gallbladder from the gallbladder bed.  He had a venous lake, which bled a fair amount about two-thirds of the way in the gallbladder bed.  I controlled this with the cautery.  After I controlled this, I irrigated the bed again multiple times, to make sure there is no more bleeding.  I did place about a two-thirds of a piece of Surgicel in the gallbladder bed and again watched this for some 10 to 15 minutes, which there was no further bleeding.  I irrigated with about 1500 mL of saline. The gallbladder is placed in EndoCatch bag,  delivered through the umbilicus.  I again rechecked for bleeding of which there was none.  The umbilical port was closed with 0 Vicryl suture.  The skin at each port was closed with 5-0 Vicryl suture, painted with Dermabond.    The patient was transported to the recovery room in good condition.  Sponge and needle counts were correct.   Sandria Bales. Ezzard Standing, M.D., FACS   DHN/MEDQ  D:  02/03/2012  T:  02/04/2012  Job:  [id number]  cc:   Georgann Housekeeper, MD Fax: [phone]

## 2012-02-04 NOTE — Progress Notes (Signed)
POD #1  Re: [name]  DOB: 1941-09-11  MRN: [id number]  ASSESSMENT AND PLAN:   1. Symptomatic gallstones.   Lap chole - 02/03/2012 - D. Zavior Thomason Has done well.  Wife in room. Ready for discharge.  2. History of pancreatitis.   Hospitalized in [place].  3. Hypertension x 2 years.  4. History of kidney stones.   Creatinine - 1.43.  Had BM Wounds look good. Discharge instructions reviewed.  D/C dictated - #[id number].  Ovidio Kin, MD, [facility] Pager: [phone] Office phone:  [phone]

## 2012-02-05 NOTE — Discharge Summary (Signed)
NAMEMarland Cisneros  KAEGAN, HETTICH NO.:  [id number]  MEDICAL RECORD NO.:  [id number]  LOCATION:  1523                         FACILITY:  [facility]  PHYSICIAN:  Sandria Bales. Ezzard Standing, M.D.  DATE OF BIRTH:  21-Aug-1941  DATE OF ADMISSION:  02/02/2012 DATE OF DISCHARGE:  02/04/2012                              DISCHARGE SUMMARY  DISCHARGE DIAGNOSES: 1. Acute cholecystitis and cholelithiasis. 2. History of pancreatitis, hospitalized in [place] in 2011. 3. Hypertension. 4. History of kidney stones.  OPERATION PERFORMED:  The patient underwent a laparoscopic cholecystectomy with intraoperative cholangiogram by Dr. Ovidio Kin on February 03, 2012.  HISTORY OF PRESENT ILLNESS:  [name] is a 71 year old white male who sees Dr. Georgann Housekeeper as his primary medical doctor and presented to the [facility] Emergency Room with acute abdominal pain.  Evaluation revealed cholelithiasis.  He was taken to the operating room on the day of admission where he underwent a     laparoscopic cholecystectomy with cholangiogram for acute cholecystitis and cholelithiasis.  His significant past history as he was hospitalized for pancreatitis in [place] in 2011.  The etiology was never found, but in retrospect this may have very well been biliary related. He has a history of Hypertension.  And he has a history of kidney stones.  HOSPITAL COURSE:  After the laparoscopic cholecystectomy, the patient did well and he is now 1 day postoperative.  He is tolerating a diet, had a bowel movement, feels better and he is ready for discharge.  DISCHARGE INSTRUCTIONS:  Include being on a low-fat diet.  He should not drive for 3 to 4 days. He will take it easy for 1 to 2 weeks.  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 I have given him Vicodin for pain and he can resume his home medication. He is to see me back in 2 weeks for followup.  He knows to call for any problems. His wife was in the room during the  discussion.   Sandria Bales. Ezzard Standing, M.D.FACS   DHN/MEDQ  D:  02/04/2012  T:  02/05/2012  Job:  [id number]  cc:   Georgann Housekeeper, MD Fax: [phone]

## 2012-02-12 ENCOUNTER — Encounter (HOSPITAL_COMMUNITY): Payer: Self-pay | Admitting: Surgery

## 2012-02-22 ENCOUNTER — Ambulatory Visit (INDEPENDENT_AMBULATORY_CARE_PROVIDER_SITE_OTHER): Payer: Medicare Other | Admitting: Surgery

## 2012-02-22 ENCOUNTER — Encounter (INDEPENDENT_AMBULATORY_CARE_PROVIDER_SITE_OTHER): Payer: Self-pay | Admitting: Surgery

## 2012-02-22 VITALS — BP 138/70 | HR 72 | Temp 98.8°F | Resp 18 | Ht 70.0 in | Wt 166.0 lb

## 2012-02-22 DIAGNOSIS — Z9049 Acquired absence of other specified parts of digestive tract: Secondary | ICD-10-CM | POA: Insufficient documentation

## 2012-02-22 DIAGNOSIS — Z9089 Acquired absence of other organs: Secondary | ICD-10-CM

## 2012-02-22 NOTE — Progress Notes (Signed)
[facility]  Terrence Kin, MD,  FACS [address] [place]  [address] [place], [place]    [address] Phone:  [phone] FAX:  [phone]   Re:   Terrence Cisneros DOB:   1941/10/16 MRN:   [id number]  ASSESSMENT AND PLAN: 1.  S/P lap chole with IOC - 02/03/2012.  Doing well.  Looks good and is very Adult nurse.  Return to office prn.  2. History of pancreatitis.   Hospitalized in [place].  3. Hypertension x 2 years.  4. History of kidney stones.   Creatinine - 1.43.  HISTORY OF PRESENT ILLNESS: Chief Complaint  Patient presents with  . Follow-up    postop gallbladder    Terrence Cisneros is a 71 y.o. (DOB: 1941/03/15)  white male who is a patient of HUSAIN,KARRAR, MD, MD and comes to me today for follow up of gall bladder surgery.  He sent me a card of thanks.  He has done well.  Is back to work.  He works in a garage.  PHYSICAL EXAM: BP 138/70  Pulse 72  Temp(Src) 98.8 F (37.1 C) (Temporal)  Resp 18  Ht 5\' 10"  (1.778 m)  Wt 166 lb (75.297 kg)  BMI 23.82 kg/m2  General: WN WM who is alert and generally healthy appearing.  HEENT: Normal. Pupils equal. Good dentition. Neck: Supple. No mass.  No thyroid mass.  Carotid pulse okay with no bruit. Lymph Nodes:  No supraclavicular or cervical nodes. Lungs: Clear to auscultation and symmetric breath sounds. Heart:  RRR. No murmur or rub.  Abdomen: Soft. No mass. No tenderness. Incisions look good. Rectal: Not done. Extremities:  Good strength and ROM  in upper and lower extremities. Neurologic:  Grossly intact to motor and sensory function. Psychiatric: Has normal mood and affect. Behavior is normal.   DATA REVIEWED: Path report to patient.  Terrence Kin, MD, FACS Office:  [phone]

## 2015-01-13 ENCOUNTER — Other Ambulatory Visit: Payer: Self-pay | Admitting: Gastroenterology

## 2015-01-13 DIAGNOSIS — R9389 Abnormal findings on diagnostic imaging of other specified body structures: Secondary | ICD-10-CM

## 2015-01-13 DIAGNOSIS — R1084 Generalized abdominal pain: Secondary | ICD-10-CM

## 2015-01-18 ENCOUNTER — Ambulatory Visit
Admission: RE | Admit: 2015-01-18 | Discharge: 2015-01-18 | Disposition: A | Discharge status: Home / Self Care | Payer: PPO | Source: Ambulatory Visit | Attending: Gastroenterology | Admitting: Gastroenterology

## 2015-01-18 ENCOUNTER — Other Ambulatory Visit: Payer: Self-pay | Admitting: Gastroenterology

## 2015-01-18 DIAGNOSIS — R9389 Abnormal findings on diagnostic imaging of other specified body structures: Secondary | ICD-10-CM

## 2015-01-18 DIAGNOSIS — R1084 Generalized abdominal pain: Secondary | ICD-10-CM

## 2015-01-18 MED ORDER — IOHEXOL 300 MG/ML  SOLN
80.0000 mL | Freq: Once | INTRAMUSCULAR | Status: AC | PRN
  Administered 2015-01-18: 80 mL via INTRAVENOUS

## 2016-01-08 DIAGNOSIS — G4762 Sleep related leg cramps: Secondary | ICD-10-CM | POA: Diagnosis not present

## 2016-01-08 DIAGNOSIS — I10 Essential (primary) hypertension: Secondary | ICD-10-CM | POA: Diagnosis not present

## 2016-01-08 DIAGNOSIS — N189 Chronic kidney disease, unspecified: Secondary | ICD-10-CM | POA: Diagnosis not present

## 2016-01-08 DIAGNOSIS — E291 Testicular hypofunction: Secondary | ICD-10-CM | POA: Diagnosis not present

## 2016-01-08 DIAGNOSIS — J309 Allergic rhinitis, unspecified: Secondary | ICD-10-CM | POA: Diagnosis not present

## 2016-01-08 DIAGNOSIS — E8881 Metabolic syndrome: Secondary | ICD-10-CM | POA: Diagnosis not present

## 2016-01-08 DIAGNOSIS — M810 Age-related osteoporosis without current pathological fracture: Secondary | ICD-10-CM | POA: Diagnosis not present

## 2016-01-08 DIAGNOSIS — M159 Polyosteoarthritis, unspecified: Secondary | ICD-10-CM | POA: Diagnosis not present

## 2016-01-08 DIAGNOSIS — E785 Hyperlipidemia, unspecified: Secondary | ICD-10-CM | POA: Diagnosis not present

## 2016-01-08 DIAGNOSIS — E7800 Pure hypercholesterolemia, unspecified: Secondary | ICD-10-CM | POA: Diagnosis not present

## 2016-01-08 DIAGNOSIS — M109 Gout, unspecified: Secondary | ICD-10-CM | POA: Diagnosis not present

## 2016-01-08 DIAGNOSIS — R609 Edema, unspecified: Secondary | ICD-10-CM | POA: Diagnosis not present

## 2016-01-21 DIAGNOSIS — Z1389 Encounter for screening for other disorder: Secondary | ICD-10-CM | POA: Diagnosis not present

## 2016-01-21 DIAGNOSIS — Z0000 Encounter for general adult medical examination without abnormal findings: Secondary | ICD-10-CM | POA: Diagnosis not present

## 2016-01-21 DIAGNOSIS — Z9181 History of falling: Secondary | ICD-10-CM | POA: Diagnosis not present

## 2016-01-21 DIAGNOSIS — Z6824 Body mass index (BMI) 24.0-24.9, adult: Secondary | ICD-10-CM | POA: Diagnosis not present

## 2016-01-21 DIAGNOSIS — I10 Essential (primary) hypertension: Secondary | ICD-10-CM | POA: Diagnosis not present

## 2016-02-14 DIAGNOSIS — J209 Acute bronchitis, unspecified: Secondary | ICD-10-CM | POA: Diagnosis not present

## 2016-02-14 DIAGNOSIS — M159 Polyosteoarthritis, unspecified: Secondary | ICD-10-CM | POA: Diagnosis not present

## 2016-02-14 DIAGNOSIS — E8881 Metabolic syndrome: Secondary | ICD-10-CM | POA: Diagnosis not present

## 2016-02-14 DIAGNOSIS — E291 Testicular hypofunction: Secondary | ICD-10-CM | POA: Diagnosis not present

## 2016-02-14 DIAGNOSIS — M109 Gout, unspecified: Secondary | ICD-10-CM | POA: Diagnosis not present

## 2016-02-14 DIAGNOSIS — R609 Edema, unspecified: Secondary | ICD-10-CM | POA: Diagnosis not present

## 2016-02-14 DIAGNOSIS — M810 Age-related osteoporosis without current pathological fracture: Secondary | ICD-10-CM | POA: Diagnosis not present

## 2016-02-14 DIAGNOSIS — N189 Chronic kidney disease, unspecified: Secondary | ICD-10-CM | POA: Diagnosis not present

## 2016-02-14 DIAGNOSIS — E7800 Pure hypercholesterolemia, unspecified: Secondary | ICD-10-CM | POA: Diagnosis not present

## 2016-02-14 DIAGNOSIS — E785 Hyperlipidemia, unspecified: Secondary | ICD-10-CM | POA: Diagnosis not present

## 2016-02-14 DIAGNOSIS — J309 Allergic rhinitis, unspecified: Secondary | ICD-10-CM | POA: Diagnosis not present

## 2016-02-14 DIAGNOSIS — G4762 Sleep related leg cramps: Secondary | ICD-10-CM | POA: Diagnosis not present

## 2016-02-16 DIAGNOSIS — E7800 Pure hypercholesterolemia, unspecified: Secondary | ICD-10-CM | POA: Diagnosis not present

## 2016-02-16 DIAGNOSIS — M159 Polyosteoarthritis, unspecified: Secondary | ICD-10-CM | POA: Diagnosis not present

## 2016-02-16 DIAGNOSIS — N189 Chronic kidney disease, unspecified: Secondary | ICD-10-CM | POA: Diagnosis not present

## 2016-02-16 DIAGNOSIS — E785 Hyperlipidemia, unspecified: Secondary | ICD-10-CM | POA: Diagnosis not present

## 2016-02-16 DIAGNOSIS — Z6824 Body mass index (BMI) 24.0-24.9, adult: Secondary | ICD-10-CM | POA: Diagnosis not present

## 2016-02-16 DIAGNOSIS — E8881 Metabolic syndrome: Secondary | ICD-10-CM | POA: Diagnosis not present

## 2016-02-16 DIAGNOSIS — J309 Allergic rhinitis, unspecified: Secondary | ICD-10-CM | POA: Diagnosis not present

## 2016-02-16 DIAGNOSIS — M109 Gout, unspecified: Secondary | ICD-10-CM | POA: Diagnosis not present

## 2016-02-16 DIAGNOSIS — E291 Testicular hypofunction: Secondary | ICD-10-CM | POA: Diagnosis not present

## 2016-02-16 DIAGNOSIS — R609 Edema, unspecified: Secondary | ICD-10-CM | POA: Diagnosis not present

## 2016-02-16 DIAGNOSIS — M810 Age-related osteoporosis without current pathological fracture: Secondary | ICD-10-CM | POA: Diagnosis not present

## 2016-02-16 DIAGNOSIS — J101 Influenza due to other identified influenza virus with other respiratory manifestations: Secondary | ICD-10-CM | POA: Diagnosis not present

## 2016-04-29 DIAGNOSIS — Z6824 Body mass index (BMI) 24.0-24.9, adult: Secondary | ICD-10-CM | POA: Diagnosis not present

## 2016-04-29 DIAGNOSIS — I10 Essential (primary) hypertension: Secondary | ICD-10-CM | POA: Diagnosis not present

## 2016-04-29 DIAGNOSIS — E8881 Metabolic syndrome: Secondary | ICD-10-CM | POA: Diagnosis not present

## 2016-04-29 DIAGNOSIS — M159 Polyosteoarthritis, unspecified: Secondary | ICD-10-CM | POA: Diagnosis not present

## 2016-04-29 DIAGNOSIS — E7800 Pure hypercholesterolemia, unspecified: Secondary | ICD-10-CM | POA: Diagnosis not present

## 2016-04-29 DIAGNOSIS — E785 Hyperlipidemia, unspecified: Secondary | ICD-10-CM | POA: Diagnosis not present

## 2016-04-29 DIAGNOSIS — G4762 Sleep related leg cramps: Secondary | ICD-10-CM | POA: Diagnosis not present

## 2016-04-29 DIAGNOSIS — M109 Gout, unspecified: Secondary | ICD-10-CM | POA: Diagnosis not present

## 2016-04-29 DIAGNOSIS — J309 Allergic rhinitis, unspecified: Secondary | ICD-10-CM | POA: Diagnosis not present

## 2016-04-29 DIAGNOSIS — M180 Bilateral primary osteoarthritis of first carpometacarpal joints: Secondary | ICD-10-CM | POA: Diagnosis not present

## 2016-04-29 DIAGNOSIS — N189 Chronic kidney disease, unspecified: Secondary | ICD-10-CM | POA: Diagnosis not present

## 2016-08-05 DIAGNOSIS — N189 Chronic kidney disease, unspecified: Secondary | ICD-10-CM | POA: Diagnosis not present

## 2016-08-05 DIAGNOSIS — E291 Testicular hypofunction: Secondary | ICD-10-CM | POA: Diagnosis not present

## 2016-08-05 DIAGNOSIS — E8881 Metabolic syndrome: Secondary | ICD-10-CM | POA: Diagnosis not present

## 2016-08-05 DIAGNOSIS — R609 Edema, unspecified: Secondary | ICD-10-CM | POA: Diagnosis not present

## 2016-08-05 DIAGNOSIS — G4762 Sleep related leg cramps: Secondary | ICD-10-CM | POA: Diagnosis not present

## 2016-08-05 DIAGNOSIS — I10 Essential (primary) hypertension: Secondary | ICD-10-CM | POA: Diagnosis not present

## 2016-08-05 DIAGNOSIS — J309 Allergic rhinitis, unspecified: Secondary | ICD-10-CM | POA: Diagnosis not present

## 2016-08-05 DIAGNOSIS — M109 Gout, unspecified: Secondary | ICD-10-CM | POA: Diagnosis not present

## 2016-08-05 DIAGNOSIS — E7800 Pure hypercholesterolemia, unspecified: Secondary | ICD-10-CM | POA: Diagnosis not present

## 2016-08-05 DIAGNOSIS — M810 Age-related osteoporosis without current pathological fracture: Secondary | ICD-10-CM | POA: Diagnosis not present

## 2016-08-05 DIAGNOSIS — E785 Hyperlipidemia, unspecified: Secondary | ICD-10-CM | POA: Diagnosis not present

## 2016-08-05 DIAGNOSIS — M159 Polyosteoarthritis, unspecified: Secondary | ICD-10-CM | POA: Diagnosis not present

## 2016-08-10 DIAGNOSIS — G4762 Sleep related leg cramps: Secondary | ICD-10-CM | POA: Diagnosis not present

## 2016-08-10 DIAGNOSIS — E785 Hyperlipidemia, unspecified: Secondary | ICD-10-CM | POA: Diagnosis not present

## 2016-08-10 DIAGNOSIS — E8881 Metabolic syndrome: Secondary | ICD-10-CM | POA: Diagnosis not present

## 2016-08-10 DIAGNOSIS — M109 Gout, unspecified: Secondary | ICD-10-CM | POA: Diagnosis not present

## 2016-08-10 DIAGNOSIS — R229 Localized swelling, mass and lump, unspecified: Secondary | ICD-10-CM | POA: Diagnosis not present

## 2016-08-10 DIAGNOSIS — I10 Essential (primary) hypertension: Secondary | ICD-10-CM | POA: Diagnosis not present

## 2016-08-10 DIAGNOSIS — J309 Allergic rhinitis, unspecified: Secondary | ICD-10-CM | POA: Diagnosis not present

## 2016-08-10 DIAGNOSIS — M810 Age-related osteoporosis without current pathological fracture: Secondary | ICD-10-CM | POA: Diagnosis not present

## 2016-08-10 DIAGNOSIS — M159 Polyosteoarthritis, unspecified: Secondary | ICD-10-CM | POA: Diagnosis not present

## 2016-08-10 DIAGNOSIS — E7800 Pure hypercholesterolemia, unspecified: Secondary | ICD-10-CM | POA: Diagnosis not present

## 2016-08-10 DIAGNOSIS — R609 Edema, unspecified: Secondary | ICD-10-CM | POA: Diagnosis not present

## 2016-08-10 DIAGNOSIS — E291 Testicular hypofunction: Secondary | ICD-10-CM | POA: Diagnosis not present

## 2016-08-29 DIAGNOSIS — M25521 Pain in right elbow: Secondary | ICD-10-CM | POA: Insufficient documentation

## 2016-11-11 DIAGNOSIS — M810 Age-related osteoporosis without current pathological fracture: Secondary | ICD-10-CM | POA: Diagnosis not present

## 2016-11-11 DIAGNOSIS — E8881 Metabolic syndrome: Secondary | ICD-10-CM | POA: Diagnosis not present

## 2016-11-11 DIAGNOSIS — R785 Finding of other psychotropic drug in blood: Secondary | ICD-10-CM | POA: Diagnosis not present

## 2016-11-11 DIAGNOSIS — G4762 Sleep related leg cramps: Secondary | ICD-10-CM | POA: Diagnosis not present

## 2016-11-11 DIAGNOSIS — J309 Allergic rhinitis, unspecified: Secondary | ICD-10-CM | POA: Diagnosis not present

## 2016-11-11 DIAGNOSIS — N189 Chronic kidney disease, unspecified: Secondary | ICD-10-CM | POA: Diagnosis not present

## 2016-11-11 DIAGNOSIS — R609 Edema, unspecified: Secondary | ICD-10-CM | POA: Diagnosis not present

## 2016-11-11 DIAGNOSIS — M109 Gout, unspecified: Secondary | ICD-10-CM | POA: Diagnosis not present

## 2016-11-11 DIAGNOSIS — E7800 Pure hypercholesterolemia, unspecified: Secondary | ICD-10-CM | POA: Diagnosis not present

## 2016-11-11 DIAGNOSIS — M159 Polyosteoarthritis, unspecified: Secondary | ICD-10-CM | POA: Diagnosis not present

## 2016-11-11 DIAGNOSIS — E291 Testicular hypofunction: Secondary | ICD-10-CM | POA: Diagnosis not present

## 2016-11-11 DIAGNOSIS — I10 Essential (primary) hypertension: Secondary | ICD-10-CM | POA: Diagnosis not present

## 2017-01-19 DIAGNOSIS — I10 Essential (primary) hypertension: Secondary | ICD-10-CM | POA: Diagnosis not present

## 2017-01-19 DIAGNOSIS — J309 Allergic rhinitis, unspecified: Secondary | ICD-10-CM | POA: Diagnosis not present

## 2017-01-19 DIAGNOSIS — N189 Chronic kidney disease, unspecified: Secondary | ICD-10-CM | POA: Diagnosis not present

## 2017-01-19 DIAGNOSIS — Z0000 Encounter for general adult medical examination without abnormal findings: Secondary | ICD-10-CM | POA: Diagnosis not present

## 2017-02-10 DIAGNOSIS — N189 Chronic kidney disease, unspecified: Secondary | ICD-10-CM | POA: Diagnosis not present

## 2017-02-10 DIAGNOSIS — M159 Polyosteoarthritis, unspecified: Secondary | ICD-10-CM | POA: Diagnosis not present

## 2017-02-10 DIAGNOSIS — Z9181 History of falling: Secondary | ICD-10-CM | POA: Diagnosis not present

## 2017-02-10 DIAGNOSIS — M810 Age-related osteoporosis without current pathological fracture: Secondary | ICD-10-CM | POA: Diagnosis not present

## 2017-02-10 DIAGNOSIS — G4762 Sleep related leg cramps: Secondary | ICD-10-CM | POA: Diagnosis not present

## 2017-02-10 DIAGNOSIS — M109 Gout, unspecified: Secondary | ICD-10-CM | POA: Diagnosis not present

## 2017-02-10 DIAGNOSIS — E8881 Metabolic syndrome: Secondary | ICD-10-CM | POA: Diagnosis not present

## 2017-02-10 DIAGNOSIS — G5603 Carpal tunnel syndrome, bilateral upper limbs: Secondary | ICD-10-CM | POA: Diagnosis not present

## 2017-02-10 DIAGNOSIS — E7800 Pure hypercholesterolemia, unspecified: Secondary | ICD-10-CM | POA: Diagnosis not present

## 2017-02-10 DIAGNOSIS — R609 Edema, unspecified: Secondary | ICD-10-CM | POA: Diagnosis not present

## 2017-02-10 DIAGNOSIS — I10 Essential (primary) hypertension: Secondary | ICD-10-CM | POA: Diagnosis not present

## 2017-02-10 DIAGNOSIS — E291 Testicular hypofunction: Secondary | ICD-10-CM | POA: Diagnosis not present

## 2017-03-13 DIAGNOSIS — M25539 Pain in unspecified wrist: Secondary | ICD-10-CM | POA: Diagnosis not present

## 2017-03-13 DIAGNOSIS — G5603 Carpal tunnel syndrome, bilateral upper limbs: Secondary | ICD-10-CM | POA: Diagnosis not present

## 2017-03-13 DIAGNOSIS — R278 Other lack of coordination: Secondary | ICD-10-CM | POA: Insufficient documentation

## 2017-03-13 DIAGNOSIS — M1990 Unspecified osteoarthritis, unspecified site: Secondary | ICD-10-CM | POA: Insufficient documentation

## 2017-03-13 DIAGNOSIS — G8929 Other chronic pain: Secondary | ICD-10-CM | POA: Diagnosis not present

## 2017-04-24 DIAGNOSIS — M1990 Unspecified osteoarthritis, unspecified site: Secondary | ICD-10-CM | POA: Diagnosis not present

## 2017-04-24 DIAGNOSIS — G5603 Carpal tunnel syndrome, bilateral upper limbs: Secondary | ICD-10-CM | POA: Diagnosis not present

## 2017-04-24 DIAGNOSIS — G8929 Other chronic pain: Secondary | ICD-10-CM | POA: Diagnosis not present

## 2017-04-24 DIAGNOSIS — M25539 Pain in unspecified wrist: Secondary | ICD-10-CM | POA: Diagnosis not present

## 2017-05-10 DIAGNOSIS — G5611 Other lesions of median nerve, right upper limb: Secondary | ICD-10-CM | POA: Diagnosis not present

## 2017-05-16 DIAGNOSIS — M79642 Pain in left hand: Secondary | ICD-10-CM | POA: Diagnosis not present

## 2017-05-16 DIAGNOSIS — G5603 Carpal tunnel syndrome, bilateral upper limbs: Secondary | ICD-10-CM | POA: Diagnosis not present

## 2017-05-16 DIAGNOSIS — M1811 Unilateral primary osteoarthritis of first carpometacarpal joint, right hand: Secondary | ICD-10-CM | POA: Diagnosis not present

## 2017-05-16 DIAGNOSIS — M19032 Primary osteoarthritis, left wrist: Secondary | ICD-10-CM | POA: Diagnosis not present

## 2017-05-16 DIAGNOSIS — M79641 Pain in right hand: Secondary | ICD-10-CM | POA: Diagnosis not present

## 2017-05-16 DIAGNOSIS — M19031 Primary osteoarthritis, right wrist: Secondary | ICD-10-CM | POA: Diagnosis not present

## 2017-05-16 DIAGNOSIS — M1812 Unilateral primary osteoarthritis of first carpometacarpal joint, left hand: Secondary | ICD-10-CM | POA: Diagnosis not present

## 2017-05-26 DIAGNOSIS — E8881 Metabolic syndrome: Secondary | ICD-10-CM | POA: Diagnosis not present

## 2017-05-26 DIAGNOSIS — I10 Essential (primary) hypertension: Secondary | ICD-10-CM | POA: Diagnosis not present

## 2017-05-26 DIAGNOSIS — E7800 Pure hypercholesterolemia, unspecified: Secondary | ICD-10-CM | POA: Diagnosis not present

## 2017-05-26 DIAGNOSIS — Z139 Encounter for screening, unspecified: Secondary | ICD-10-CM | POA: Diagnosis not present

## 2017-05-26 DIAGNOSIS — M159 Polyosteoarthritis, unspecified: Secondary | ICD-10-CM | POA: Diagnosis not present

## 2017-05-26 DIAGNOSIS — J309 Allergic rhinitis, unspecified: Secondary | ICD-10-CM | POA: Diagnosis not present

## 2017-05-26 DIAGNOSIS — M109 Gout, unspecified: Secondary | ICD-10-CM | POA: Diagnosis not present

## 2017-05-26 DIAGNOSIS — N189 Chronic kidney disease, unspecified: Secondary | ICD-10-CM | POA: Diagnosis not present

## 2017-05-26 DIAGNOSIS — E785 Hyperlipidemia, unspecified: Secondary | ICD-10-CM | POA: Diagnosis not present

## 2017-05-26 DIAGNOSIS — M810 Age-related osteoporosis without current pathological fracture: Secondary | ICD-10-CM | POA: Diagnosis not present

## 2017-05-26 DIAGNOSIS — L03116 Cellulitis of left lower limb: Secondary | ICD-10-CM | POA: Diagnosis not present

## 2017-05-26 DIAGNOSIS — E291 Testicular hypofunction: Secondary | ICD-10-CM | POA: Diagnosis not present

## 2017-09-04 DIAGNOSIS — M24631 Ankylosis, right wrist: Secondary | ICD-10-CM | POA: Diagnosis not present

## 2017-09-04 DIAGNOSIS — G5601 Carpal tunnel syndrome, right upper limb: Secondary | ICD-10-CM | POA: Diagnosis not present

## 2017-09-04 DIAGNOSIS — M67831 Other specified disorders of synovium, right wrist: Secondary | ICD-10-CM | POA: Diagnosis not present

## 2017-09-04 DIAGNOSIS — M6588 Other synovitis and tenosynovitis, other site: Secondary | ICD-10-CM | POA: Diagnosis not present

## 2017-09-08 DIAGNOSIS — R5382 Chronic fatigue, unspecified: Secondary | ICD-10-CM | POA: Diagnosis not present

## 2017-09-08 DIAGNOSIS — R7301 Impaired fasting glucose: Secondary | ICD-10-CM | POA: Diagnosis not present

## 2017-09-08 DIAGNOSIS — J309 Allergic rhinitis, unspecified: Secondary | ICD-10-CM | POA: Diagnosis not present

## 2017-09-08 DIAGNOSIS — Z23 Encounter for immunization: Secondary | ICD-10-CM | POA: Diagnosis not present

## 2017-09-08 DIAGNOSIS — I10 Essential (primary) hypertension: Secondary | ICD-10-CM | POA: Diagnosis not present

## 2017-09-08 DIAGNOSIS — Z0000 Encounter for general adult medical examination without abnormal findings: Secondary | ICD-10-CM | POA: Diagnosis not present

## 2017-09-08 DIAGNOSIS — E7800 Pure hypercholesterolemia, unspecified: Secondary | ICD-10-CM | POA: Diagnosis not present

## 2017-09-08 DIAGNOSIS — K589 Irritable bowel syndrome without diarrhea: Secondary | ICD-10-CM | POA: Diagnosis not present

## 2017-09-08 DIAGNOSIS — E785 Hyperlipidemia, unspecified: Secondary | ICD-10-CM | POA: Diagnosis not present

## 2017-09-08 DIAGNOSIS — Z1389 Encounter for screening for other disorder: Secondary | ICD-10-CM | POA: Diagnosis not present

## 2017-09-24 DIAGNOSIS — E785 Hyperlipidemia, unspecified: Secondary | ICD-10-CM | POA: Diagnosis not present

## 2017-09-24 DIAGNOSIS — J309 Allergic rhinitis, unspecified: Secondary | ICD-10-CM | POA: Diagnosis not present

## 2017-09-24 DIAGNOSIS — I10 Essential (primary) hypertension: Secondary | ICD-10-CM | POA: Diagnosis not present

## 2017-09-24 DIAGNOSIS — M159 Polyosteoarthritis, unspecified: Secondary | ICD-10-CM | POA: Diagnosis not present

## 2017-09-24 DIAGNOSIS — E8881 Metabolic syndrome: Secondary | ICD-10-CM | POA: Diagnosis not present

## 2017-09-24 DIAGNOSIS — E039 Hypothyroidism, unspecified: Secondary | ICD-10-CM | POA: Diagnosis not present

## 2017-09-24 DIAGNOSIS — M109 Gout, unspecified: Secondary | ICD-10-CM | POA: Diagnosis not present

## 2017-09-24 DIAGNOSIS — E291 Testicular hypofunction: Secondary | ICD-10-CM | POA: Diagnosis not present

## 2017-09-24 DIAGNOSIS — Z6824 Body mass index (BMI) 24.0-24.9, adult: Secondary | ICD-10-CM | POA: Diagnosis not present

## 2017-09-24 DIAGNOSIS — E7800 Pure hypercholesterolemia, unspecified: Secondary | ICD-10-CM | POA: Diagnosis not present

## 2017-10-29 DIAGNOSIS — K521 Toxic gastroenteritis and colitis: Secondary | ICD-10-CM | POA: Diagnosis not present

## 2017-10-29 DIAGNOSIS — K529 Noninfective gastroenteritis and colitis, unspecified: Secondary | ICD-10-CM | POA: Diagnosis not present

## 2017-12-22 DIAGNOSIS — G5603 Carpal tunnel syndrome, bilateral upper limbs: Secondary | ICD-10-CM | POA: Diagnosis not present

## 2017-12-22 DIAGNOSIS — E8881 Metabolic syndrome: Secondary | ICD-10-CM | POA: Diagnosis not present

## 2017-12-22 DIAGNOSIS — J309 Allergic rhinitis, unspecified: Secondary | ICD-10-CM | POA: Diagnosis not present

## 2017-12-22 DIAGNOSIS — E039 Hypothyroidism, unspecified: Secondary | ICD-10-CM | POA: Diagnosis not present

## 2017-12-22 DIAGNOSIS — M109 Gout, unspecified: Secondary | ICD-10-CM | POA: Diagnosis not present

## 2017-12-22 DIAGNOSIS — M159 Polyosteoarthritis, unspecified: Secondary | ICD-10-CM | POA: Diagnosis not present

## 2017-12-22 DIAGNOSIS — E785 Hyperlipidemia, unspecified: Secondary | ICD-10-CM | POA: Diagnosis not present

## 2017-12-22 DIAGNOSIS — I10 Essential (primary) hypertension: Secondary | ICD-10-CM | POA: Diagnosis not present

## 2017-12-22 DIAGNOSIS — E291 Testicular hypofunction: Secondary | ICD-10-CM | POA: Diagnosis not present

## 2017-12-22 DIAGNOSIS — E7800 Pure hypercholesterolemia, unspecified: Secondary | ICD-10-CM | POA: Diagnosis not present

## 2017-12-22 DIAGNOSIS — N189 Chronic kidney disease, unspecified: Secondary | ICD-10-CM | POA: Diagnosis not present

## 2017-12-24 DIAGNOSIS — L578 Other skin changes due to chronic exposure to nonionizing radiation: Secondary | ICD-10-CM | POA: Diagnosis not present

## 2017-12-24 DIAGNOSIS — L570 Actinic keratosis: Secondary | ICD-10-CM | POA: Diagnosis not present

## 2017-12-24 DIAGNOSIS — L821 Other seborrheic keratosis: Secondary | ICD-10-CM | POA: Diagnosis not present

## 2018-02-27 DIAGNOSIS — R600 Localized edema: Secondary | ICD-10-CM | POA: Diagnosis not present

## 2018-02-27 DIAGNOSIS — M205X9 Other deformities of toe(s) (acquired), unspecified foot: Secondary | ICD-10-CM | POA: Insufficient documentation

## 2018-02-27 DIAGNOSIS — L84 Corns and callosities: Secondary | ICD-10-CM | POA: Diagnosis not present

## 2018-03-01 DIAGNOSIS — N189 Chronic kidney disease, unspecified: Secondary | ICD-10-CM | POA: Diagnosis not present

## 2018-03-01 DIAGNOSIS — E039 Hypothyroidism, unspecified: Secondary | ICD-10-CM | POA: Diagnosis not present

## 2018-03-01 DIAGNOSIS — E8881 Metabolic syndrome: Secondary | ICD-10-CM | POA: Diagnosis not present

## 2018-03-01 DIAGNOSIS — E785 Hyperlipidemia, unspecified: Secondary | ICD-10-CM | POA: Diagnosis not present

## 2018-03-01 DIAGNOSIS — Z9181 History of falling: Secondary | ICD-10-CM | POA: Diagnosis not present

## 2018-03-01 DIAGNOSIS — M109 Gout, unspecified: Secondary | ICD-10-CM | POA: Diagnosis not present

## 2018-03-01 DIAGNOSIS — L0390 Cellulitis, unspecified: Secondary | ICD-10-CM | POA: Diagnosis not present

## 2018-03-01 DIAGNOSIS — J309 Allergic rhinitis, unspecified: Secondary | ICD-10-CM | POA: Diagnosis not present

## 2018-03-01 DIAGNOSIS — M159 Polyosteoarthritis, unspecified: Secondary | ICD-10-CM | POA: Diagnosis not present

## 2018-03-01 DIAGNOSIS — E291 Testicular hypofunction: Secondary | ICD-10-CM | POA: Diagnosis not present

## 2018-03-01 DIAGNOSIS — G5603 Carpal tunnel syndrome, bilateral upper limbs: Secondary | ICD-10-CM | POA: Diagnosis not present

## 2018-03-01 DIAGNOSIS — E7800 Pure hypercholesterolemia, unspecified: Secondary | ICD-10-CM | POA: Diagnosis not present

## 2018-03-05 DIAGNOSIS — J101 Influenza due to other identified influenza virus with other respiratory manifestations: Secondary | ICD-10-CM | POA: Diagnosis not present

## 2018-03-05 DIAGNOSIS — L03115 Cellulitis of right lower limb: Secondary | ICD-10-CM | POA: Insufficient documentation

## 2018-03-08 DIAGNOSIS — L03115 Cellulitis of right lower limb: Secondary | ICD-10-CM | POA: Diagnosis not present

## 2018-03-08 DIAGNOSIS — E860 Dehydration: Secondary | ICD-10-CM | POA: Diagnosis not present

## 2018-03-08 DIAGNOSIS — J111 Influenza due to unidentified influenza virus with other respiratory manifestations: Secondary | ICD-10-CM | POA: Diagnosis not present

## 2018-03-23 DIAGNOSIS — M810 Age-related osteoporosis without current pathological fracture: Secondary | ICD-10-CM | POA: Diagnosis not present

## 2018-03-23 DIAGNOSIS — M109 Gout, unspecified: Secondary | ICD-10-CM | POA: Diagnosis not present

## 2018-03-23 DIAGNOSIS — J309 Allergic rhinitis, unspecified: Secondary | ICD-10-CM | POA: Diagnosis not present

## 2018-03-23 DIAGNOSIS — G5603 Carpal tunnel syndrome, bilateral upper limbs: Secondary | ICD-10-CM | POA: Diagnosis not present

## 2018-03-23 DIAGNOSIS — E039 Hypothyroidism, unspecified: Secondary | ICD-10-CM | POA: Diagnosis not present

## 2018-03-23 DIAGNOSIS — I10 Essential (primary) hypertension: Secondary | ICD-10-CM | POA: Diagnosis not present

## 2018-03-23 DIAGNOSIS — N189 Chronic kidney disease, unspecified: Secondary | ICD-10-CM | POA: Diagnosis not present

## 2018-03-23 DIAGNOSIS — G4762 Sleep related leg cramps: Secondary | ICD-10-CM | POA: Diagnosis not present

## 2018-03-23 DIAGNOSIS — E785 Hyperlipidemia, unspecified: Secondary | ICD-10-CM | POA: Diagnosis not present

## 2018-03-23 DIAGNOSIS — E291 Testicular hypofunction: Secondary | ICD-10-CM | POA: Diagnosis not present

## 2018-03-23 DIAGNOSIS — E7800 Pure hypercholesterolemia, unspecified: Secondary | ICD-10-CM | POA: Diagnosis not present

## 2018-03-23 DIAGNOSIS — R609 Edema, unspecified: Secondary | ICD-10-CM | POA: Diagnosis not present

## 2018-03-23 DIAGNOSIS — E8881 Metabolic syndrome: Secondary | ICD-10-CM | POA: Diagnosis not present

## 2018-03-25 DIAGNOSIS — L219 Seborrheic dermatitis, unspecified: Secondary | ICD-10-CM | POA: Diagnosis not present

## 2018-03-25 DIAGNOSIS — L570 Actinic keratosis: Secondary | ICD-10-CM | POA: Diagnosis not present

## 2018-05-01 DIAGNOSIS — G4762 Sleep related leg cramps: Secondary | ICD-10-CM | POA: Diagnosis not present

## 2018-05-01 DIAGNOSIS — J309 Allergic rhinitis, unspecified: Secondary | ICD-10-CM | POA: Diagnosis not present

## 2018-05-01 DIAGNOSIS — E8881 Metabolic syndrome: Secondary | ICD-10-CM | POA: Diagnosis not present

## 2018-05-01 DIAGNOSIS — E785 Hyperlipidemia, unspecified: Secondary | ICD-10-CM | POA: Diagnosis not present

## 2018-05-01 DIAGNOSIS — G5603 Carpal tunnel syndrome, bilateral upper limbs: Secondary | ICD-10-CM | POA: Diagnosis not present

## 2018-05-01 DIAGNOSIS — M109 Gout, unspecified: Secondary | ICD-10-CM | POA: Diagnosis not present

## 2018-05-01 DIAGNOSIS — J028 Acute pharyngitis due to other specified organisms: Secondary | ICD-10-CM | POA: Diagnosis not present

## 2018-05-01 DIAGNOSIS — E291 Testicular hypofunction: Secondary | ICD-10-CM | POA: Diagnosis not present

## 2018-05-01 DIAGNOSIS — M810 Age-related osteoporosis without current pathological fracture: Secondary | ICD-10-CM | POA: Diagnosis not present

## 2018-05-01 DIAGNOSIS — N189 Chronic kidney disease, unspecified: Secondary | ICD-10-CM | POA: Diagnosis not present

## 2018-05-01 DIAGNOSIS — M159 Polyosteoarthritis, unspecified: Secondary | ICD-10-CM | POA: Diagnosis not present

## 2018-05-01 DIAGNOSIS — R609 Edema, unspecified: Secondary | ICD-10-CM | POA: Diagnosis not present

## 2018-06-22 DIAGNOSIS — E8881 Metabolic syndrome: Secondary | ICD-10-CM | POA: Diagnosis not present

## 2018-06-22 DIAGNOSIS — I10 Essential (primary) hypertension: Secondary | ICD-10-CM | POA: Diagnosis not present

## 2018-06-22 DIAGNOSIS — E291 Testicular hypofunction: Secondary | ICD-10-CM | POA: Diagnosis not present

## 2018-06-22 DIAGNOSIS — E7800 Pure hypercholesterolemia, unspecified: Secondary | ICD-10-CM | POA: Diagnosis not present

## 2018-06-22 DIAGNOSIS — N189 Chronic kidney disease, unspecified: Secondary | ICD-10-CM | POA: Diagnosis not present

## 2018-06-22 DIAGNOSIS — R609 Edema, unspecified: Secondary | ICD-10-CM | POA: Diagnosis not present

## 2018-06-22 DIAGNOSIS — E039 Hypothyroidism, unspecified: Secondary | ICD-10-CM | POA: Diagnosis not present

## 2018-06-22 DIAGNOSIS — E789 Disorder of lipoprotein metabolism, unspecified: Secondary | ICD-10-CM | POA: Diagnosis not present

## 2018-06-22 DIAGNOSIS — M109 Gout, unspecified: Secondary | ICD-10-CM | POA: Diagnosis not present

## 2018-07-10 DIAGNOSIS — S90822A Blister (nonthermal), left foot, initial encounter: Secondary | ICD-10-CM | POA: Diagnosis not present

## 2018-09-21 DIAGNOSIS — E8881 Metabolic syndrome: Secondary | ICD-10-CM | POA: Diagnosis not present

## 2018-09-21 DIAGNOSIS — Z1331 Encounter for screening for depression: Secondary | ICD-10-CM | POA: Diagnosis not present

## 2018-09-21 DIAGNOSIS — I10 Essential (primary) hypertension: Secondary | ICD-10-CM | POA: Diagnosis not present

## 2018-09-21 DIAGNOSIS — E039 Hypothyroidism, unspecified: Secondary | ICD-10-CM | POA: Diagnosis not present

## 2018-09-21 DIAGNOSIS — M810 Age-related osteoporosis without current pathological fracture: Secondary | ICD-10-CM | POA: Diagnosis not present

## 2018-09-21 DIAGNOSIS — G4762 Sleep related leg cramps: Secondary | ICD-10-CM | POA: Diagnosis not present

## 2018-09-21 DIAGNOSIS — E7800 Pure hypercholesterolemia, unspecified: Secondary | ICD-10-CM | POA: Diagnosis not present

## 2018-09-21 DIAGNOSIS — E291 Testicular hypofunction: Secondary | ICD-10-CM | POA: Diagnosis not present

## 2018-09-21 DIAGNOSIS — N189 Chronic kidney disease, unspecified: Secondary | ICD-10-CM | POA: Diagnosis not present

## 2018-09-21 DIAGNOSIS — J309 Allergic rhinitis, unspecified: Secondary | ICD-10-CM | POA: Diagnosis not present

## 2018-09-21 DIAGNOSIS — L03114 Cellulitis of left upper limb: Secondary | ICD-10-CM | POA: Diagnosis not present

## 2018-09-21 DIAGNOSIS — E785 Hyperlipidemia, unspecified: Secondary | ICD-10-CM | POA: Diagnosis not present

## 2018-09-21 DIAGNOSIS — Z1339 Encounter for screening examination for other mental health and behavioral disorders: Secondary | ICD-10-CM | POA: Diagnosis not present

## 2018-09-21 DIAGNOSIS — G5603 Carpal tunnel syndrome, bilateral upper limbs: Secondary | ICD-10-CM | POA: Diagnosis not present

## 2018-09-21 DIAGNOSIS — M109 Gout, unspecified: Secondary | ICD-10-CM | POA: Diagnosis not present

## 2018-12-02 DIAGNOSIS — M9901 Segmental and somatic dysfunction of cervical region: Secondary | ICD-10-CM | POA: Diagnosis not present

## 2018-12-02 DIAGNOSIS — M4003 Postural kyphosis, cervicothoracic region: Secondary | ICD-10-CM | POA: Diagnosis not present

## 2018-12-02 DIAGNOSIS — M5382 Other specified dorsopathies, cervical region: Secondary | ICD-10-CM | POA: Diagnosis not present

## 2018-12-02 DIAGNOSIS — M542 Cervicalgia: Secondary | ICD-10-CM | POA: Diagnosis not present

## 2018-12-02 DIAGNOSIS — R293 Abnormal posture: Secondary | ICD-10-CM | POA: Diagnosis not present

## 2018-12-02 DIAGNOSIS — M4126 Other idiopathic scoliosis, lumbar region: Secondary | ICD-10-CM | POA: Diagnosis not present

## 2018-12-02 DIAGNOSIS — M9902 Segmental and somatic dysfunction of thoracic region: Secondary | ICD-10-CM | POA: Diagnosis not present

## 2018-12-02 DIAGNOSIS — M50322 Other cervical disc degeneration at C5-C6 level: Secondary | ICD-10-CM | POA: Diagnosis not present

## 2018-12-02 DIAGNOSIS — M9903 Segmental and somatic dysfunction of lumbar region: Secondary | ICD-10-CM | POA: Diagnosis not present

## 2018-12-09 DIAGNOSIS — M4003 Postural kyphosis, cervicothoracic region: Secondary | ICD-10-CM | POA: Diagnosis not present

## 2018-12-09 DIAGNOSIS — M50322 Other cervical disc degeneration at C5-C6 level: Secondary | ICD-10-CM | POA: Diagnosis not present

## 2018-12-09 DIAGNOSIS — R293 Abnormal posture: Secondary | ICD-10-CM | POA: Diagnosis not present

## 2018-12-09 DIAGNOSIS — M9903 Segmental and somatic dysfunction of lumbar region: Secondary | ICD-10-CM | POA: Diagnosis not present

## 2018-12-09 DIAGNOSIS — M542 Cervicalgia: Secondary | ICD-10-CM | POA: Diagnosis not present

## 2018-12-09 DIAGNOSIS — M9901 Segmental and somatic dysfunction of cervical region: Secondary | ICD-10-CM | POA: Diagnosis not present

## 2018-12-09 DIAGNOSIS — M4126 Other idiopathic scoliosis, lumbar region: Secondary | ICD-10-CM | POA: Diagnosis not present

## 2018-12-09 DIAGNOSIS — M9902 Segmental and somatic dysfunction of thoracic region: Secondary | ICD-10-CM | POA: Diagnosis not present

## 2018-12-09 DIAGNOSIS — M5382 Other specified dorsopathies, cervical region: Secondary | ICD-10-CM | POA: Diagnosis not present

## 2018-12-12 DIAGNOSIS — M542 Cervicalgia: Secondary | ICD-10-CM | POA: Diagnosis not present

## 2018-12-12 DIAGNOSIS — M5382 Other specified dorsopathies, cervical region: Secondary | ICD-10-CM | POA: Diagnosis not present

## 2018-12-12 DIAGNOSIS — M4126 Other idiopathic scoliosis, lumbar region: Secondary | ICD-10-CM | POA: Diagnosis not present

## 2018-12-12 DIAGNOSIS — R293 Abnormal posture: Secondary | ICD-10-CM | POA: Diagnosis not present

## 2018-12-12 DIAGNOSIS — M50322 Other cervical disc degeneration at C5-C6 level: Secondary | ICD-10-CM | POA: Diagnosis not present

## 2018-12-12 DIAGNOSIS — M9903 Segmental and somatic dysfunction of lumbar region: Secondary | ICD-10-CM | POA: Diagnosis not present

## 2018-12-12 DIAGNOSIS — M4003 Postural kyphosis, cervicothoracic region: Secondary | ICD-10-CM | POA: Diagnosis not present

## 2018-12-12 DIAGNOSIS — M9902 Segmental and somatic dysfunction of thoracic region: Secondary | ICD-10-CM | POA: Diagnosis not present

## 2018-12-12 DIAGNOSIS — M9901 Segmental and somatic dysfunction of cervical region: Secondary | ICD-10-CM | POA: Diagnosis not present

## 2018-12-13 DIAGNOSIS — M9902 Segmental and somatic dysfunction of thoracic region: Secondary | ICD-10-CM | POA: Diagnosis not present

## 2018-12-13 DIAGNOSIS — M4003 Postural kyphosis, cervicothoracic region: Secondary | ICD-10-CM | POA: Diagnosis not present

## 2018-12-13 DIAGNOSIS — M542 Cervicalgia: Secondary | ICD-10-CM | POA: Diagnosis not present

## 2018-12-13 DIAGNOSIS — M50322 Other cervical disc degeneration at C5-C6 level: Secondary | ICD-10-CM | POA: Diagnosis not present

## 2018-12-13 DIAGNOSIS — R293 Abnormal posture: Secondary | ICD-10-CM | POA: Diagnosis not present

## 2018-12-13 DIAGNOSIS — M5382 Other specified dorsopathies, cervical region: Secondary | ICD-10-CM | POA: Diagnosis not present

## 2018-12-13 DIAGNOSIS — M4126 Other idiopathic scoliosis, lumbar region: Secondary | ICD-10-CM | POA: Diagnosis not present

## 2018-12-13 DIAGNOSIS — M9901 Segmental and somatic dysfunction of cervical region: Secondary | ICD-10-CM | POA: Diagnosis not present

## 2018-12-13 DIAGNOSIS — M9903 Segmental and somatic dysfunction of lumbar region: Secondary | ICD-10-CM | POA: Diagnosis not present

## 2018-12-14 DIAGNOSIS — R609 Edema, unspecified: Secondary | ICD-10-CM | POA: Diagnosis not present

## 2018-12-14 DIAGNOSIS — M810 Age-related osteoporosis without current pathological fracture: Secondary | ICD-10-CM | POA: Diagnosis not present

## 2018-12-14 DIAGNOSIS — J309 Allergic rhinitis, unspecified: Secondary | ICD-10-CM | POA: Diagnosis not present

## 2018-12-14 DIAGNOSIS — M109 Gout, unspecified: Secondary | ICD-10-CM | POA: Diagnosis not present

## 2018-12-14 DIAGNOSIS — N189 Chronic kidney disease, unspecified: Secondary | ICD-10-CM | POA: Diagnosis not present

## 2018-12-14 DIAGNOSIS — G5603 Carpal tunnel syndrome, bilateral upper limbs: Secondary | ICD-10-CM | POA: Diagnosis not present

## 2018-12-14 DIAGNOSIS — E785 Hyperlipidemia, unspecified: Secondary | ICD-10-CM | POA: Diagnosis not present

## 2018-12-14 DIAGNOSIS — J208 Acute bronchitis due to other specified organisms: Secondary | ICD-10-CM | POA: Diagnosis not present

## 2018-12-14 DIAGNOSIS — E8881 Metabolic syndrome: Secondary | ICD-10-CM | POA: Diagnosis not present

## 2018-12-14 DIAGNOSIS — M159 Polyosteoarthritis, unspecified: Secondary | ICD-10-CM | POA: Diagnosis not present

## 2018-12-14 DIAGNOSIS — E291 Testicular hypofunction: Secondary | ICD-10-CM | POA: Diagnosis not present

## 2018-12-14 DIAGNOSIS — G4762 Sleep related leg cramps: Secondary | ICD-10-CM | POA: Diagnosis not present

## 2018-12-16 DIAGNOSIS — M5382 Other specified dorsopathies, cervical region: Secondary | ICD-10-CM | POA: Diagnosis not present

## 2018-12-16 DIAGNOSIS — M4003 Postural kyphosis, cervicothoracic region: Secondary | ICD-10-CM | POA: Diagnosis not present

## 2018-12-16 DIAGNOSIS — M9901 Segmental and somatic dysfunction of cervical region: Secondary | ICD-10-CM | POA: Diagnosis not present

## 2018-12-16 DIAGNOSIS — M9903 Segmental and somatic dysfunction of lumbar region: Secondary | ICD-10-CM | POA: Diagnosis not present

## 2018-12-16 DIAGNOSIS — M50322 Other cervical disc degeneration at C5-C6 level: Secondary | ICD-10-CM | POA: Diagnosis not present

## 2018-12-16 DIAGNOSIS — M9902 Segmental and somatic dysfunction of thoracic region: Secondary | ICD-10-CM | POA: Diagnosis not present

## 2018-12-16 DIAGNOSIS — R293 Abnormal posture: Secondary | ICD-10-CM | POA: Diagnosis not present

## 2018-12-16 DIAGNOSIS — M542 Cervicalgia: Secondary | ICD-10-CM | POA: Diagnosis not present

## 2018-12-16 DIAGNOSIS — M4126 Other idiopathic scoliosis, lumbar region: Secondary | ICD-10-CM | POA: Diagnosis not present

## 2018-12-19 DIAGNOSIS — M9902 Segmental and somatic dysfunction of thoracic region: Secondary | ICD-10-CM | POA: Diagnosis not present

## 2018-12-19 DIAGNOSIS — M4003 Postural kyphosis, cervicothoracic region: Secondary | ICD-10-CM | POA: Diagnosis not present

## 2018-12-19 DIAGNOSIS — M9903 Segmental and somatic dysfunction of lumbar region: Secondary | ICD-10-CM | POA: Diagnosis not present

## 2018-12-19 DIAGNOSIS — M9901 Segmental and somatic dysfunction of cervical region: Secondary | ICD-10-CM | POA: Diagnosis not present

## 2018-12-19 DIAGNOSIS — M4126 Other idiopathic scoliosis, lumbar region: Secondary | ICD-10-CM | POA: Diagnosis not present

## 2018-12-19 DIAGNOSIS — M5382 Other specified dorsopathies, cervical region: Secondary | ICD-10-CM | POA: Diagnosis not present

## 2018-12-19 DIAGNOSIS — R293 Abnormal posture: Secondary | ICD-10-CM | POA: Diagnosis not present

## 2018-12-19 DIAGNOSIS — M50322 Other cervical disc degeneration at C5-C6 level: Secondary | ICD-10-CM | POA: Diagnosis not present

## 2018-12-19 DIAGNOSIS — M542 Cervicalgia: Secondary | ICD-10-CM | POA: Diagnosis not present

## 2019-01-08 DIAGNOSIS — N200 Calculus of kidney: Secondary | ICD-10-CM | POA: Diagnosis not present

## 2019-01-08 DIAGNOSIS — K922 Gastrointestinal hemorrhage, unspecified: Secondary | ICD-10-CM | POA: Diagnosis not present

## 2019-01-08 DIAGNOSIS — L03116 Cellulitis of left lower limb: Secondary | ICD-10-CM | POA: Diagnosis not present

## 2019-01-08 DIAGNOSIS — K4020 Bilateral inguinal hernia, without obstruction or gangrene, not specified as recurrent: Secondary | ICD-10-CM | POA: Diagnosis not present

## 2019-01-08 DIAGNOSIS — K649 Unspecified hemorrhoids: Secondary | ICD-10-CM | POA: Diagnosis not present

## 2019-01-10 DIAGNOSIS — K625 Hemorrhage of anus and rectum: Secondary | ICD-10-CM | POA: Diagnosis not present

## 2019-01-16 DIAGNOSIS — E039 Hypothyroidism, unspecified: Secondary | ICD-10-CM | POA: Diagnosis not present

## 2019-01-16 DIAGNOSIS — Z6824 Body mass index (BMI) 24.0-24.9, adult: Secondary | ICD-10-CM | POA: Diagnosis not present

## 2019-01-16 DIAGNOSIS — K625 Hemorrhage of anus and rectum: Secondary | ICD-10-CM | POA: Diagnosis not present

## 2019-01-16 DIAGNOSIS — Z0000 Encounter for general adult medical examination without abnormal findings: Secondary | ICD-10-CM | POA: Diagnosis not present

## 2019-01-16 DIAGNOSIS — E7800 Pure hypercholesterolemia, unspecified: Secondary | ICD-10-CM | POA: Diagnosis not present

## 2019-01-16 DIAGNOSIS — N189 Chronic kidney disease, unspecified: Secondary | ICD-10-CM | POA: Diagnosis not present

## 2019-01-16 DIAGNOSIS — J309 Allergic rhinitis, unspecified: Secondary | ICD-10-CM | POA: Diagnosis not present

## 2019-01-29 DIAGNOSIS — D123 Benign neoplasm of transverse colon: Secondary | ICD-10-CM | POA: Diagnosis not present

## 2019-01-29 DIAGNOSIS — K921 Melena: Secondary | ICD-10-CM | POA: Diagnosis not present

## 2019-01-31 ENCOUNTER — Ambulatory Visit: Payer: PPO | Admitting: Sports Medicine

## 2019-01-31 ENCOUNTER — Encounter: Payer: Self-pay | Admitting: Sports Medicine

## 2019-01-31 ENCOUNTER — Other Ambulatory Visit: Payer: Self-pay

## 2019-01-31 VITALS — BP 133/66 | HR 77 | Temp 97.6°F | Resp 16 | Ht 70.0 in | Wt 170.0 lb

## 2019-01-31 DIAGNOSIS — L97529 Non-pressure chronic ulcer of other part of left foot with unspecified severity: Secondary | ICD-10-CM | POA: Diagnosis not present

## 2019-01-31 DIAGNOSIS — D123 Benign neoplasm of transverse colon: Secondary | ICD-10-CM | POA: Diagnosis not present

## 2019-01-31 DIAGNOSIS — R600 Localized edema: Secondary | ICD-10-CM | POA: Diagnosis not present

## 2019-01-31 DIAGNOSIS — L97524 Non-pressure chronic ulcer of other part of left foot with necrosis of bone: Secondary | ICD-10-CM | POA: Diagnosis not present

## 2019-01-31 DIAGNOSIS — M7989 Other specified soft tissue disorders: Secondary | ICD-10-CM | POA: Diagnosis not present

## 2019-01-31 DIAGNOSIS — M86172 Other acute osteomyelitis, left ankle and foot: Secondary | ICD-10-CM

## 2019-01-31 DIAGNOSIS — E1165 Type 2 diabetes mellitus with hyperglycemia: Secondary | ICD-10-CM | POA: Diagnosis not present

## 2019-01-31 DIAGNOSIS — M79672 Pain in left foot: Secondary | ICD-10-CM

## 2019-01-31 NOTE — Progress Notes (Signed)
Subjective: Terrence Cisneros is a 78 y.o. male patient seen in office for evaluation of ulceration of the left foot for 2 years states that it started after a callus was being trimmed. Patient has a history of diabetes and was just diagnosed today.  Patient reports that he has not yet been given supplies to check his blood sugars at home.   Patient is changing the dressing using antibiotic cream at home/ with help of wife. Denies nausea/fever/vomiting/chills/night sweats/shortness of breath/pain. Patient has no other pedal complaints at this time.  Review of Systems  Skin:       Foot ulcer  All other systems reviewed and are negative.    Patient Active Problem List   Diagnosis Date Noted  . Controlled type 2 diabetes mellitus with hyperglycemia, without long-term current use of insulin ([place]) 01/31/2019  . Cellulitis of right foot 03/05/2018  . Localized edema 02/27/2018  . Pre-ulcerative calluses 02/27/2018  . 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 Arthritis 03/13/2017  . Bilateral carpal tunnel syndrome 03/13/2017  . Clumsiness 03/13/2017  . Right elbow pain 08/29/2016  . History of cholecystectomy 02/22/2012   Current Outpatient Medications on File Prior to Visit  Medication Sig Dispense Refill  . aspirin EC 81 MG tablet Take 81 mg by mouth daily.     No current facility-administered medications on file prior to visit.    Allergies  Allergen Reactions  . Lisinopril Other (See Comments)    BP dropped very low    No results found for this or any previous visit (from the past 2160 hour(s)).  Objective: Vitals:   01/31/19 1138  Weight: 170 lb (77.1 kg)  Height: 5\' 10"  (1.778 m)    General: Patient is awake, alert, oriented x 3 and in no acute distress.  Dermatology: Skin is warm and dry bilateral with a full thickness ulceration present  Plantar aspect of first metatarsal head left foot. Ulceration measures 1 cm x 1 cm x 0.8 cm. There is a  Keratotic border with a granular base.  The ulceration does not probe to bone but probes close to the joint capsule. There is no malodor, no active drainage, no erythema, faint edema. No acute signs of infection.   Vascular: Dorsalis Pedis pulse = 1/4 Bilateral,  Posterior Tibial pulse = 1/4 Bilateral,  Capillary Fill Time < 5 seconds  Neurologic: Protective sensation severely diminished bilateral left greater than right.  Musculosketal: There is no pain to palpation to the ulcer plantar left foot.  There is significant digital contractures on the left foot  Xrays, left foot on CD reveal a small fleck of bone/erosion at the base of the second toe however this does not correspond to where his ulceration is but does suggest possible osteomyelitis.  No results for input(s): GRAMSTAIN, LABORGA in the last 8760 hours.  Assessment and Plan:  Problem List Items Addressed This Visit      Other   Controlled type 2 diabetes mellitus with hyperglycemia, without long-term current use of insulin ([place])   Localized edema    Other Visit Diagnoses    Acute osteomyelitis of left ankle or foot ([place])    -  Primary   Left foot pain           -Examined patient and discussed the progression of the wound and treatment alternatives. -Xrays reviewed from yesterday on CD - Excisionally dedbrided ulceration at plantar first metatarsal head of left foot to healthy bleeding borders removing nonviable tissue using a sterile chisel  blade. Wound measures post debridement as above. Wound was debrided to the level of the dermis with viable wound base exposed to promote healing. Hemostasis was achieved with manuel pressure. Patient tolerated procedure well without any discomfort or anesthesia necessary for this wound debridement.  -Wound culture obtained will call patient if he needs to start oral antibiotics based on culture results.  Patient has completed a course of Keflex 1 month ago. -Applied antibiotic cream and offloading pad with dry sterile dressing  and instructed patient to continue with daily dressings at home consisting of the same. -Advised patient to return to using cam boot that he has at home to alleviate pressure to the ball of the foot - Advised patient to go to the ER or return to office if the wound worsens or if constitutional symptoms are present. -Order MRI for further evaluation to rule out osteomyelitis -Patient to return to office in after MRI or within 1-2 weeks for follow-up wound care or sooner if problems arise.  Landis Martins, DPM

## 2019-02-03 ENCOUNTER — Other Ambulatory Visit: Payer: Self-pay

## 2019-02-03 DIAGNOSIS — M86172 Other acute osteomyelitis, left ankle and foot: Secondary | ICD-10-CM

## 2019-02-03 DIAGNOSIS — E1165 Type 2 diabetes mellitus with hyperglycemia: Secondary | ICD-10-CM

## 2019-02-03 DIAGNOSIS — L97524 Non-pressure chronic ulcer of other part of left foot with necrosis of bone: Secondary | ICD-10-CM

## 2019-02-03 DIAGNOSIS — R600 Localized edema: Secondary | ICD-10-CM

## 2019-02-03 LAB — WOUND CULTURE

## 2019-02-03 NOTE — Progress Notes (Signed)
mri

## 2019-02-04 ENCOUNTER — Telehealth: Payer: Self-pay | Admitting: *Deleted

## 2019-02-04 MED ORDER — AMOXICILLIN-POT CLAVULANATE 875-125 MG PO TABS: 1.0000 | ORAL_TABLET | Freq: Two times a day (BID) | ORAL | 0 refills | Status: DC

## 2019-02-04 NOTE — Telephone Encounter (Signed)
I informed pt of Dr. Leeanne Rio review of culture results and order.

## 2019-02-04 NOTE — Telephone Encounter (Signed)
-----   Message from Landis Martins, [place] sent at 02/03/2019 10:25 AM EST ----- Please inform patient Wound culture + for Strept Please send Augmentin 875 bid x 28 tabs to pharmacy Thanks Dr. Cannon Kettle

## 2019-02-06 NOTE — Telephone Encounter (Signed)
I informed pt of [facility] 02/18/2019 appt.

## 2019-02-06 NOTE — Telephone Encounter (Signed)
[facility] scheduled pt for MRI [id number] left foot 02/18/2019 arrival 11:45am, testing 12:00pm. Faxed to [facility].

## 2019-02-18 DIAGNOSIS — M86172 Other acute osteomyelitis, left ankle and foot: Secondary | ICD-10-CM | POA: Diagnosis not present

## 2019-02-18 DIAGNOSIS — S91302A Unspecified open wound, left foot, initial encounter: Secondary | ICD-10-CM | POA: Diagnosis not present

## 2019-02-19 ENCOUNTER — Telehealth: Payer: Self-pay | Admitting: Sports Medicine

## 2019-02-19 NOTE — Telephone Encounter (Signed)
Pt was instructed to call the office and inform the doctor once he had his MRI done. Patient called stating that he had it done yesterday, 02/18/19.

## 2019-02-20 ENCOUNTER — Encounter: Payer: Self-pay | Admitting: Sports Medicine

## 2019-02-24 ENCOUNTER — Telehealth: Payer: Self-pay | Admitting: *Deleted

## 2019-02-24 NOTE — Telephone Encounter (Signed)
-----   Message from Landis Martins, [place] sent at 02/20/2019  8:40 PM EDT ----- Make sure patient has an appt for wound care and for discussion of MRI results next week Thanks Dr Chauncey Cruel

## 2019-02-24 NOTE — Telephone Encounter (Signed)
Scheduled patient per Dr Leeanne Rio request for Friday 3/20 at 730 am

## 2019-02-28 ENCOUNTER — Other Ambulatory Visit: Payer: Self-pay

## 2019-02-28 ENCOUNTER — Encounter: Payer: Self-pay | Admitting: Sports Medicine

## 2019-02-28 ENCOUNTER — Ambulatory Visit: Payer: PPO | Admitting: Sports Medicine

## 2019-02-28 VITALS — BP 126/67 | HR 87 | Temp 97.9°F | Resp 16

## 2019-02-28 DIAGNOSIS — L97524 Non-pressure chronic ulcer of other part of left foot with necrosis of bone: Secondary | ICD-10-CM | POA: Diagnosis not present

## 2019-02-28 DIAGNOSIS — E1140 Type 2 diabetes mellitus with diabetic neuropathy, unspecified: Secondary | ICD-10-CM

## 2019-02-28 DIAGNOSIS — M79672 Pain in left foot: Secondary | ICD-10-CM

## 2019-02-28 DIAGNOSIS — E1165 Type 2 diabetes mellitus with hyperglycemia: Secondary | ICD-10-CM

## 2019-02-28 NOTE — Progress Notes (Signed)
Subjective: Terrence Cisneros is a 78 y.o. male patient seen in office for follow up evaluation of ulceration of the left foot.  Patient is changing the dressing using antibiotic cream at home with help of wife and reports that it looks much better, no drainage however there is a little bit of swelling. Completed Augmentin. Denies nausea/fever/vomiting/chills/night sweats/shortness of breath/pain. Patient has no other pedal complaints at this time.  FBS Don't Check A1c 7.2   Patient Active Problem List   Diagnosis Date Noted  . Controlled type 2 diabetes mellitus with hyperglycemia, without long-term current use of insulin ([place]) 01/31/2019  . Cellulitis of right foot 03/05/2018  . Localized edema 02/27/2018  . Pre-ulcerative calluses 02/27/2018  . 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 Arthritis 03/13/2017  . Bilateral carpal tunnel syndrome 03/13/2017  . Clumsiness 03/13/2017  . Right elbow pain 08/29/2016  . History of cholecystectomy 02/22/2012   Current Outpatient Medications on File Prior to Visit  Medication Sig Dispense Refill  . amoxicillin-clavulanate (AUGMENTIN) 875-125 MG tablet Take 1 tablet by mouth 2 (two) times daily. 28 tablet 0  . aspirin EC 81 MG tablet Take 81 mg by mouth daily.    . fluticasone (FLONASE) 50 MCG/ACT nasal spray     . losartan-hydrochlorothiazide (HYZAAR) 100-25 MG tablet TAKE 1/2 TABLET BY MOUTH EVERY DAY     No current facility-administered medications on file prior to visit.    Allergies  Allergen Reactions  . Lisinopril Other (See Comments)    BP dropped very low    Recent Results (from the past 2160 hour(s))  WOUND CULTURE     Status: Abnormal   Collection Time: 01/31/19 12:38 PM  Result Value Ref Range   Gram Stain Result Final report    Organism ID, Bacteria Comment     Comment: No white blood cells seen.   Organism ID, Bacteria Comment     Comment: Few gram positive cocci   Aerobic Bacterial Culture Final report (A)    Organism ID, Bacteria  Comment (A)     Comment: Beta hemolytic Streptococcus, group B Moderate growth Penicillin and ampicillin are drugs of choice for treatment of beta-hemolytic streptococcal infections. Susceptibility testing of penicillins and other beta-lactam agents approved by the FDA for treatment of beta-hemolytic streptococcal infections need not be performed routinely because nonsusceptible isolates are extremely rare in any beta-hemolytic streptococcus and have not been reported for Streptococcus pyogenes (group A). (CLSI)     Objective: There were no vitals filed for this visit.  General: Patient is awake, alert, oriented x 3 and in no acute distress.  Dermatology: Skin is warm and dry bilateral with a partial thickness ulceration present Plantar aspect of first metatarsal head left foot. Ulceration measures 0.1x0.1x0.1cm. There is a Keratotic border with a granular base. The ulceration does not probe to bone. There is no malodor, no active drainage, no erythema, faint edema. No acute signs of infection.  Callus sub met 5 on left   Vascular: Dorsalis Pedis pulse = 1/4 Bilateral,  Posterior Tibial pulse = 1/4 Bilateral,  Capillary Fill Time < 5 seconds  Neurologic: Protective sensation severely diminished bilateral left greater than right.  Musculosketal: There is no pain to palpation to the ulcer plantar left foot.  There is significant digital contractures on the left foot  No results for input(s): GRAMSTAIN, LABORGA in the last 8760 hours.  Assessment and Plan:  Problem List Items Addressed This Visit      Other   Controlled type 2  diabetes mellitus with hyperglycemia, without long-term current use of insulin ([facility])   Relevant Medications   metFORMIN (GLUCOPHAGE) 500 MG tablet    Other Visit Diagnoses    Chronic toe ulcer, left, with necrosis of bone ([place])    -  Primary   Negative MRI   Type 2 diabetes mellitus with diabetic neuropathy, unspecified whether long term insulin use (HCC)        Relevant Medications   metFORMIN (GLUCOPHAGE) 500 MG tablet   Left foot pain         -Examined patient and discussed the progression of the wound and treatment alternatives. -MRI reviewed Negative for osteomyelitis  - Excisionally dedbrided ulceration at plantar first metatarsal head of left foot to healthy bleeding borders removing nonviable tissue using a sterile chisel blade. Wound measures post debridement as above. Wound was debrided to the level of the dermis with viable wound base exposed to promote healing. Hemostasis was achieved with manuel pressure. Patient tolerated procedure well without any discomfort or anesthesia necessary for this wound debridement.  -Augmentin completed  -Applied antibiotic cream and offloading pad with dry sterile dressing and instructed patient to continue with daily dressings at home consisting of the same. -[employer] diabetic shoe order form was completed; office to contact primary care for approval / certification;  Office to arrange shoe fitting and dispensing. - Advised patient to go to the ER or return to office if the wound worsens or if constitutional symptoms are present. -Patient to return to office 4-6 weeks or sooner if problems arise.  Landis Martins, DPM

## 2019-04-17 ENCOUNTER — Ambulatory Visit: Payer: PPO | Admitting: Sports Medicine

## 2019-04-17 ENCOUNTER — Other Ambulatory Visit: Payer: Self-pay

## 2019-04-17 ENCOUNTER — Encounter: Payer: Self-pay | Admitting: Sports Medicine

## 2019-04-17 VITALS — BP 115/73 | HR 83 | Temp 97.0°F | Resp 16

## 2019-04-17 DIAGNOSIS — M79672 Pain in left foot: Secondary | ICD-10-CM | POA: Diagnosis not present

## 2019-04-17 DIAGNOSIS — M2042 Other hammer toe(s) (acquired), left foot: Secondary | ICD-10-CM

## 2019-04-17 DIAGNOSIS — E1165 Type 2 diabetes mellitus with hyperglycemia: Secondary | ICD-10-CM | POA: Diagnosis not present

## 2019-04-17 DIAGNOSIS — M2041 Other hammer toe(s) (acquired), right foot: Secondary | ICD-10-CM

## 2019-04-17 DIAGNOSIS — Q667 Congenital pes cavus, unspecified foot: Secondary | ICD-10-CM

## 2019-04-17 DIAGNOSIS — L97521 Non-pressure chronic ulcer of other part of left foot limited to breakdown of skin: Secondary | ICD-10-CM

## 2019-04-17 DIAGNOSIS — E1140 Type 2 diabetes mellitus with diabetic neuropathy, unspecified: Secondary | ICD-10-CM

## 2019-04-17 NOTE — Progress Notes (Addendum)
Subjective: Terrence Cisneros is a 78 y.o. male patient seen in office for follow up evaluation of ulceration of the left foot.  Patient reports he had an episode of swelling and redness with peeling skin at left 1st and 2nd toes which seem to be better now. Has been apply skin cream to area with help of wife and reports that it looks much better. Denies nausea/fever/vomiting/chills/night sweats/shortness of breath/pain. Patient has no other pedal complaints at this time.  FBS Don't Check A1c 7.2   Patient Active Problem List   Diagnosis Date Noted  . Controlled type 2 diabetes mellitus with hyperglycemia, without long-term current use of insulin ([place]) 01/31/2019  . Cellulitis of right foot 03/05/2018  . Localized edema 02/27/2018  . Pre-ulcerative calluses 02/27/2018  . 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 Arthritis 03/13/2017  . Bilateral carpal tunnel syndrome 03/13/2017  . Clumsiness 03/13/2017  . Right elbow pain 08/29/2016  . History of cholecystectomy 02/22/2012   Current Outpatient Medications on File Prior to Visit  Medication Sig Dispense Refill  . amoxicillin-clavulanate (AUGMENTIN) 875-125 MG tablet Take 1 tablet by mouth 2 (two) times daily. 28 tablet 0  . aspirin EC 81 MG tablet Take 81 mg by mouth daily.    . fluticasone (FLONASE) 50 MCG/ACT nasal spray     . losartan-hydrochlorothiazide (HYZAAR) 100-25 MG tablet TAKE 1/2 TABLET BY MOUTH EVERY DAY    . metFORMIN (GLUCOPHAGE) 500 MG tablet TAKE 1 TABLET (500 MG TOTAL) BY MOUTH 2 TIMES DAILY WITH MEALS.     No current facility-administered medications on file prior to visit.    Allergies  Allergen Reactions  . Lisinopril Other (See Comments)    BP dropped very low    Recent Results (from the past 2160 hour(s))  WOUND CULTURE     Status: Abnormal   Collection Time: 01/31/19 12:38 PM  Result Value Ref Range   Gram Stain Result Final report    Organism ID, Bacteria Comment     Comment: No white blood cells seen.   Organism  ID, Bacteria Comment     Comment: Few gram positive cocci   Aerobic Bacterial Culture Final report (A)    Organism ID, Bacteria Comment (A)     Comment: Beta hemolytic Streptococcus, group B Moderate growth Penicillin and ampicillin are drugs of choice for treatment of beta-hemolytic streptococcal infections. Susceptibility testing of penicillins and other beta-lactam agents approved by the FDA for treatment of beta-hemolytic streptococcal infections need not be performed routinely because nonsusceptible isolates are extremely rare in any beta-hemolytic streptococcus and have not been reported for Streptococcus pyogenes (group A). (CLSI)     Objective: There were no vitals filed for this visit.  General: Patient is awake, alert, oriented x 3 and in no acute distress.  Dermatology: Skin is warm and dry bilateral with a now healed ulceration at the Plantar aspect of first metatarsal head left foot with peeling callus skin. There is no malodor, no active drainage, no erythema, faint edema. No acute signs of infection.  Callus sub met 5 on left   Vascular: Dorsalis Pedis pulse = 1/4 Bilateral,  Posterior Tibial pulse = 1/4 Bilateral,  Capillary Fill Time < 5 seconds  Neurologic: Protective sensation severely diminished bilateral left greater than right.  Musculosketal: There is no pain to palpation to the healed ulcer plantar left foot.  There is significant digital contractures on the left foot and pes cavus foot type.  No results for input(s): GRAMSTAIN, LABORGA in the last  8760 hours.  Assessment and Plan:  Problem List Items Addressed This Visit      Other   Controlled type 2 diabetes mellitus with hyperglycemia, without long-term current use of insulin ([place])    Other Visit Diagnoses    Chronic foot ulcer, left, limited to breakdown of skin ([place])    -  Primary   Type 2 diabetes mellitus with diabetic neuropathy, unspecified whether long term insulin use ([place])       Left foot  pain       Pes cavus       Hammer toes of both feet          -Wound check performed - Wound healed, no dressings needed. Applied offloading padding in shoe and advised patient skin emollients PRN -Patient is awaiting diabetic shoe measurements  - Advised patient to go to the ER or return to office if the wound returns or if constitutional symptoms are present. -Patient to return for diabetic shoes or sooner if problems arise.  Landis Martins, DPM

## 2019-04-18 ENCOUNTER — Ambulatory Visit: Payer: PPO | Admitting: Sports Medicine

## 2019-05-01 ENCOUNTER — Ambulatory Visit: Payer: PPO | Admitting: Orthotics

## 2019-05-01 ENCOUNTER — Other Ambulatory Visit: Payer: Self-pay

## 2019-05-01 DIAGNOSIS — M1711 Unilateral primary osteoarthritis, right knee: Secondary | ICD-10-CM | POA: Diagnosis not present

## 2019-05-01 DIAGNOSIS — G8929 Other chronic pain: Secondary | ICD-10-CM | POA: Diagnosis not present

## 2019-05-01 DIAGNOSIS — Z0000 Encounter for general adult medical examination without abnormal findings: Secondary | ICD-10-CM | POA: Diagnosis not present

## 2019-05-01 DIAGNOSIS — Z13220 Encounter for screening for lipoid disorders: Secondary | ICD-10-CM | POA: Diagnosis not present

## 2019-05-01 DIAGNOSIS — Z125 Encounter for screening for malignant neoplasm of prostate: Secondary | ICD-10-CM | POA: Diagnosis not present

## 2019-05-01 DIAGNOSIS — Q667 Congenital pes cavus, unspecified foot: Secondary | ICD-10-CM

## 2019-05-01 DIAGNOSIS — M25561 Pain in right knee: Secondary | ICD-10-CM | POA: Diagnosis not present

## 2019-05-01 DIAGNOSIS — L97521 Non-pressure chronic ulcer of other part of left foot limited to breakdown of skin: Secondary | ICD-10-CM

## 2019-05-01 DIAGNOSIS — E039 Hypothyroidism, unspecified: Secondary | ICD-10-CM | POA: Diagnosis not present

## 2019-05-01 DIAGNOSIS — R5383 Other fatigue: Secondary | ICD-10-CM | POA: Diagnosis not present

## 2019-05-01 DIAGNOSIS — J3089 Other allergic rhinitis: Secondary | ICD-10-CM | POA: Insufficient documentation

## 2019-05-01 DIAGNOSIS — R5381 Other malaise: Secondary | ICD-10-CM | POA: Diagnosis not present

## 2019-05-01 DIAGNOSIS — M4802 Spinal stenosis, cervical region: Secondary | ICD-10-CM | POA: Diagnosis not present

## 2019-05-01 DIAGNOSIS — M5412 Radiculopathy, cervical region: Secondary | ICD-10-CM | POA: Diagnosis not present

## 2019-05-01 DIAGNOSIS — Z1211 Encounter for screening for malignant neoplasm of colon: Secondary | ICD-10-CM | POA: Diagnosis not present

## 2019-05-01 DIAGNOSIS — E11621 Type 2 diabetes mellitus with foot ulcer: Secondary | ICD-10-CM | POA: Diagnosis not present

## 2019-05-01 DIAGNOSIS — I10 Essential (primary) hypertension: Secondary | ICD-10-CM | POA: Insufficient documentation

## 2019-05-01 DIAGNOSIS — M2042 Other hammer toe(s) (acquired), left foot: Secondary | ICD-10-CM

## 2019-05-01 DIAGNOSIS — M2041 Other hammer toe(s) (acquired), right foot: Secondary | ICD-10-CM

## 2019-05-01 DIAGNOSIS — E1165 Type 2 diabetes mellitus with hyperglycemia: Secondary | ICD-10-CM | POA: Diagnosis not present

## 2019-05-01 NOTE — Progress Notes (Signed)

## 2019-05-06 DIAGNOSIS — M5412 Radiculopathy, cervical region: Secondary | ICD-10-CM | POA: Diagnosis not present

## 2019-05-06 DIAGNOSIS — M542 Cervicalgia: Secondary | ICD-10-CM | POA: Diagnosis not present

## 2019-06-02 DIAGNOSIS — M5030 Other cervical disc degeneration, unspecified cervical region: Secondary | ICD-10-CM | POA: Diagnosis not present

## 2019-06-02 DIAGNOSIS — Z6823 Body mass index (BMI) 23.0-23.9, adult: Secondary | ICD-10-CM | POA: Diagnosis not present

## 2019-06-02 DIAGNOSIS — M47892 Other spondylosis, cervical region: Secondary | ICD-10-CM | POA: Diagnosis not present

## 2019-06-04 ENCOUNTER — Other Ambulatory Visit: Payer: Self-pay | Admitting: Orthopedic Surgery

## 2019-06-10 NOTE — Patient Instructions (Addendum)
YOU NEED TO HAVE A COVID 19 TEST ON______Thursday, July 2nd_______, THIS TEST MUST BE DONE BEFORE SURGERY, COME TO [place] ENTRANCE. ONCE YOUR COVID TEST IS COMPLETED, PLEASE BEGIN THE QUARANTINE INSTRUCTIONS AS OUTLINED IN YOUR HANDOUT.                Terrence Cisneros    Your procedure is scheduled on: 06-16-2019  Report to [facility] Main  Entrance    Report to admitting at 6:00 AM      Call this number if you have problems the morning of surgery [phone]    Remember: [place], NO [place].   Do not eat food After Midnight. YOU MAY HAVE CLEAR LIQUIDS FROM MIDNIGHT UNTIL 5:00AM. At 5:00AM Please finish the prescribed Pre-Surgery Gatorade drink. Nothing by mouth after you finish the Gatorade drink !   CLEAR LIQUID DIET   Foods Allowed                                                                     Foods Excluded  Coffee and tea, regular and decaf                             liquids that you cannot  Plain Jell-O in any flavor                                             see through such as: Fruit ices (not with fruit pulp)                                     milk, soups, orange juice  Iced Popsicles                                    All solid food Carbonated beverages, regular and diet                                    Cranberry, grape and apple juices Sports drinks like Gatorade Lightly seasoned clear broth or consume(fat free) Sugar, honey syrup  Sample Menu Breakfast                                Lunch                                     Supper Cranberry juice                    Beef broth  Chicken broth Jell-O                                     Grape juice                           Apple juice Coffee or tea                        Jell-O                                      Popsicle                                                 Coffee or tea                        Coffee or tea  _____________________________________________________________________     Take these medicines the morning of surgery with A SIP OF WATER:  NONE   DO NOT TAKE ANY DIABETIC MEDICATIONS DAY OF YOUR SURGERY          How to Manage Your Diabetes Before and After Surgery  Why is it important to control my blood sugar before and after surgery? . Improving blood sugar levels before and after surgery helps healing and can limit problems. . A way of improving blood sugar control is eating a healthy diet by: o  Eating less sugar and carbohydrates o  Increasing activity/exercise o  Talking with your doctor about reaching your blood sugar goals . High blood sugars (greater than 180 mg/dL) can raise your risk of infections and slow your recovery, so you will need to focus on controlling your diabetes during the weeks before surgery. . Make sure that the doctor who takes care of your diabetes knows about your planned surgery including the date and location.    WHAT DO I DO ABOUT MY DIABETES MEDICATION?  DO NOT TAKE ANY DIABETES MEDICATION THE MORNING OF SURGERY!      Reviewed and Endorsed by [facility] Patient Education Committee, August 2015                      You may not have any metal on your body including hair pins and              piercings  Do not wear jewelry, make-up, lotions, powders or perfumes, deodorant             Do not wear nail polish.  Do not shave  48 hours prior to surgery.              Men may shave face and neck.   Do not bring valuables to the hospital. [place].  Contacts, dentures or bridgework may not be worn into surgery.  Leave suitcase in the car. After surgery it may be brought to your room.                 Please read over  the following fact sheets you were given: _____________________________________________________________________              [facility] - Preparing for Surgery Before surgery, you can play an important role.  Because skin is not sterile, your skin needs to be as free of germs as possible.  You can reduce the number of germs on your skin by washing with CHG (chlorahexidine gluconate) soap before surgery.  CHG is an antiseptic cleaner which kills germs and bonds with the skin to continue killing germs even after washing. Please DO NOT use if you have an allergy to CHG or antibacterial soaps.  If your skin becomes reddened/irritated stop using the CHG and inform your nurse when you arrive at Short Stay. Do not shave (including legs and underarms) for at least 48 hours prior to the first CHG shower.  You may shave your face/neck. Please follow these instructions carefully:  1.  Shower with CHG Soap the night before surgery and the  morning of Surgery.  2.  If you choose to wash your hair, wash your hair first as usual with your  normal  shampoo.  3.  After you shampoo, rinse your hair and body thoroughly to remove the  shampoo.                           4.  Use CHG as you would any other liquid soap.  You can apply chg directly  to the skin and wash                       Gently with a scrungie or clean washcloth.  5.  Apply the CHG Soap to your body ONLY FROM THE NECK DOWN.   Do not use on face/ open                           Wound or open sores. Avoid contact with eyes, ears mouth and genitals (private parts).                       Wash face,  Genitals (private parts) with your normal soap.             6.  Wash thoroughly, paying special attention to the area where your surgery  will be performed.  7.  Thoroughly rinse your body with warm water from the neck down.  8.  DO NOT shower/wash with your normal soap after using and rinsing off  the CHG Soap.                9.  Pat yourself dry with a clean towel.            10.  Wear clean pajamas.            11.  Place clean sheets on your bed the night of your first shower and  do not  sleep with pets. Day of Surgery : Do not apply any lotions/deodorants the morning of surgery.  Please wear clean clothes to the hospital/surgery center.  FAILURE TO FOLLOW THESE INSTRUCTIONS MAY RESULT IN THE CANCELLATION OF YOUR SURGERY PATIENT SIGNATURE_________________________________  NURSE SIGNATURE__________________________________  ________________________________________________________________________   Adam [place]  An incentive spirometer is a tool that can help keep your lungs clear and active. This tool measures how well you are filling your lungs with each breath. Taking long deep breaths  may help reverse or decrease the chance of developing breathing (pulmonary) problems (especially infection) following:  A long period of time when you are unable to move or be active. BEFORE THE PROCEDURE   If the spirometer includes an indicator to show your best effort, your nurse or respiratory therapist will set it to a desired goal.  If possible, sit up straight or lean slightly forward. Try not to slouch.  Hold the incentive spirometer in an upright position. INSTRUCTIONS FOR USE  1. Sit on the edge of your bed if possible, or sit up as far as you can in bed or on a chair. 2. Hold the incentive spirometer in an upright position. 3. Breathe out normally. 4. Place the mouthpiece in your mouth and seal your lips tightly around it. 5. Breathe in slowly and as deeply as possible, raising the piston or the ball toward the top of the column. 6. Hold your breath for 3-5 seconds or for as long as possible. Allow the piston or ball to fall to the bottom of the column. 7. Remove the mouthpiece from your mouth and breathe out normally. 8. Rest for a few seconds and repeat Steps 1 through 7 at least 10 times every 1-2 hours when you are awake. Take your time and take a few normal breaths between deep breaths. 9. The spirometer may include an indicator to show your best effort.  Use the indicator as a goal to work toward during each repetition. 10. After each set of 10 deep breaths, practice coughing to be sure your lungs are clear. If you have an incision (the cut made at the time of surgery), support your incision when coughing by placing a pillow or rolled up towels firmly against it. Once you are able to get out of bed, walk around indoors and cough well. You may stop using the incentive spirometer when instructed by your caregiver.  RISKS AND COMPLICATIONS  Take your time so you do not get dizzy or light-headed.  If you are in pain, you may need to take or ask for pain medication before doing incentive spirometry. It is harder to take a deep breath if you are having pain. AFTER USE  Rest and breathe slowly and easily.  It can be helpful to keep track of a log of your progress. Your caregiver can provide you with a simple table to help with this. If you are using the spirometer at home, follow these instructions: [place] IF:   You are having difficultly using the spirometer.  You have trouble using the spirometer as often as instructed.  Your pain medication is not giving enough relief while using the spirometer.  You develop fever of 100.5 F (38.1 C) or higher. SEEK IMMEDIATE MEDICAL CARE IF:   You cough up bloody sputum that had not been present before.  You develop fever of 102 F (38.9 C) or greater.  You develop worsening pain at or near the incision site. MAKE SURE YOU:   Understand these instructions.  Will watch your condition.  Will get help right away if you are not doing well or get worse. Document Released: 04/09/2007 Document Revised: 02/19/2012 Document Reviewed: 06/10/2007 [facility] Patient Information 2014 [place], [place].   ________________________________________________________________________

## 2019-06-11 ENCOUNTER — Encounter (HOSPITAL_COMMUNITY)
Admission: RE | Admit: 2019-06-11 | Discharge: 2019-06-11 | Disposition: A | Discharge status: Home / Self Care | Payer: PPO | Source: Ambulatory Visit | Attending: Anesthesiology | Admitting: Anesthesiology

## 2019-06-11 ENCOUNTER — Other Ambulatory Visit: Payer: Self-pay

## 2019-06-11 ENCOUNTER — Encounter (HOSPITAL_COMMUNITY): Payer: Self-pay

## 2019-06-11 ENCOUNTER — Encounter (INDEPENDENT_AMBULATORY_CARE_PROVIDER_SITE_OTHER): Payer: Self-pay

## 2019-06-11 DIAGNOSIS — M1711 Unilateral primary osteoarthritis, right knee: Secondary | ICD-10-CM | POA: Diagnosis not present

## 2019-06-11 DIAGNOSIS — I10 Essential (primary) hypertension: Secondary | ICD-10-CM | POA: Diagnosis not present

## 2019-06-11 DIAGNOSIS — I440 Atrioventricular block, first degree: Secondary | ICD-10-CM | POA: Insufficient documentation

## 2019-06-11 DIAGNOSIS — I493 Ventricular premature depolarization: Secondary | ICD-10-CM | POA: Insufficient documentation

## 2019-06-11 DIAGNOSIS — Z1159 Encounter for screening for other viral diseases: Secondary | ICD-10-CM | POA: Diagnosis not present

## 2019-06-11 DIAGNOSIS — Z01818 Encounter for other preprocedural examination: Secondary | ICD-10-CM | POA: Insufficient documentation

## 2019-06-11 DIAGNOSIS — E119 Type 2 diabetes mellitus without complications: Secondary | ICD-10-CM | POA: Diagnosis not present

## 2019-06-11 HISTORY — DX: Unspecified osteoarthritis, unspecified site: M19.90

## 2019-06-11 HISTORY — DX: Hypothyroidism, unspecified: E03.9

## 2019-06-11 HISTORY — DX: Radiculopathy, cervical region: M54.12

## 2019-06-11 HISTORY — DX: Other allergy status, other than to drugs and biological substances: Z91.09

## 2019-06-11 HISTORY — DX: Type 2 diabetes mellitus without complications: E11.9

## 2019-06-11 HISTORY — DX: Polyneuropathy, unspecified: G62.9

## 2019-06-11 HISTORY — DX: Chronic kidney disease, unspecified: N18.9

## 2019-06-11 LAB — COMPREHENSIVE METABOLIC PANEL
ALT: 23 U/L (ref 0–44)
AST: 27 U/L (ref 15–41)
Albumin: 4.1 g/dL (ref 3.5–5.0)
Alkaline Phosphatase: 77 U/L (ref 38–126)
Anion gap: 10 (ref 5–15)
BUN: 29 mg/dL — ABNORMAL HIGH (ref 8–23)
CO2: 27 mmol/L (ref 22–32)
Calcium: 9.5 mg/dL (ref 8.9–10.3)
Chloride: 103 mmol/L (ref 98–111)
Creatinine, Ser: 1.52 mg/dL — ABNORMAL HIGH (ref 0.61–1.24)
GFR calc Af Amer: 50 mL/min — ABNORMAL LOW (ref >60)
GFR calc non Af Amer: 44 mL/min — ABNORMAL LOW (ref >60)
Glucose, Bld: 114 mg/dL — ABNORMAL HIGH (ref 70–99)
Potassium: 3.9 mmol/L (ref 3.5–5.1)
Sodium: 140 mmol/L (ref 135–145)
Total Bilirubin: 0.3 mg/dL (ref 0.3–1.2)
Total Protein: 7.2 g/dL (ref 6.5–8.1)

## 2019-06-11 LAB — CBC WITH DIFFERENTIAL/PLATELET
Abs Immature Granulocytes: 0.04 10*3/uL (ref 0.00–0.07)
Basophils Absolute: 0.1 10*3/uL (ref 0.0–0.1)
Basophils Relative: 1 %
Eosinophils Absolute: 0.4 10*3/uL (ref 0.0–0.5)
Eosinophils Relative: 4 %
HCT: 41.5 % (ref 39.0–52.0)
Hemoglobin: 13.6 g/dL (ref 13.0–17.0)
Immature Granulocytes: 0 %
Lymphocytes Relative: 24 %
Lymphs Abs: 2.3 10*3/uL (ref 0.7–4.0)
MCH: 30.3 pg (ref 26.0–34.0)
MCHC: 32.8 g/dL (ref 30.0–36.0)
MCV: 92.4 fL (ref 80.0–100.0)
Monocytes Absolute: 1.3 10*3/uL — ABNORMAL HIGH (ref 0.1–1.0)
Monocytes Relative: 13 %
Neutro Abs: 5.5 10*3/uL (ref 1.7–7.7)
Neutrophils Relative %: 58 %
Platelets: 292 10*3/uL (ref 150–400)
RBC: 4.49 MIL/uL (ref 4.22–5.81)
RDW: 12.7 % (ref 11.5–15.5)
WBC: 9.5 10*3/uL (ref 4.0–10.5)
nRBC: 0 % (ref 0.0–0.2)

## 2019-06-11 LAB — SURGICAL PCR SCREEN
MRSA, PCR: NEGATIVE
Staphylococcus aureus: NEGATIVE

## 2019-06-11 LAB — GLUCOSE, CAPILLARY: Glucose-Capillary: 149 mg/dL — ABNORMAL HIGH (ref 70–99)

## 2019-06-11 LAB — HEMOGLOBIN A1C
Hgb A1c MFr Bld: 6.4 % — ABNORMAL HIGH (ref 4.8–5.6)
Mean Plasma Glucose: 136.98 mg/dL

## 2019-06-12 ENCOUNTER — Other Ambulatory Visit (HOSPITAL_COMMUNITY)
Admission: RE | Admit: 2019-06-12 | Discharge: 2019-06-12 | Disposition: A | Discharge status: Home / Self Care | Payer: PPO | Source: Ambulatory Visit | Attending: Orthopedic Surgery | Admitting: Orthopedic Surgery

## 2019-06-12 DIAGNOSIS — Z01818 Encounter for other preprocedural examination: Secondary | ICD-10-CM | POA: Diagnosis not present

## 2019-06-12 LAB — SARS CORONAVIRUS 2 (TAT 6-24 HRS): SARS Coronavirus 2: NEGATIVE

## 2019-06-15 MED ORDER — BUPIVACAINE LIPOSOME 1.3 % IJ SUSP
20.0000 mL | Freq: Once | INTRAMUSCULAR | Status: DC
  Filled 2019-06-15: qty 20

## 2019-06-15 NOTE — Anesthesia Preprocedure Evaluation (Addendum)
Anesthesia Evaluation  Patient identified by MRN, date of birth, ID band Patient awake    Reviewed: Allergy & Precautions, NPO status , Patient's Chart, lab work & pertinent test results  History of Anesthesia Complications Negative for: history of anesthetic complications  Airway Mallampati: II  TM Distance: >3 FB Neck ROM: Full    Dental  (+) Dental Advisory Given, Teeth Intact   Pulmonary neg pulmonary ROS,    breath sounds clear to auscultation       Cardiovascular hypertension, Pt. on medications (-) angina Rhythm:Regular Rate:Normal     Neuro/Psych negative psych ROS   GI/Hepatic negative GI ROS, Neg liver ROS,   Endo/Other  diabetes, Type 2, Oral Hypoglycemic AgentsHypothyroidism (no meds)   Renal/GU CRFRenal disease     Musculoskeletal  (+) Arthritis ,   Abdominal   Peds  Hematology negative hematology ROS (+)   Anesthesia Other Findings   Reproductive/Obstetrics                            Anesthesia Physical Anesthesia Plan  ASA: III  Anesthesia Plan: Spinal   Post-op Pain Management:  Regional for Post-op pain   Induction:   PONV Risk Score and Plan: 1 and Treatment may vary due to age or medical condition and Propofol infusion  Airway Management Planned: Natural Airway and Simple Face Mask  Additional Equipment: None  Intra-op Plan:   Post-operative Plan:   Informed Consent: I have reviewed the patients History and Physical, chart, labs and discussed the procedure including the risks, benefits and alternatives for the proposed anesthesia with the patient or authorized representative who has indicated his/her understanding and acceptance.       Plan Discussed with: CRNA and Anesthesiologist  Anesthesia Plan Comments: (Labs reviewed, platelets acceptable. Discussed risks and benefits of spinal, including spinal/epidural hematoma, infection, failed block, and  PDPH. Patient expressed understanding and wished to proceed. )       Anesthesia Quick Evaluation

## 2019-06-16 ENCOUNTER — Ambulatory Visit (HOSPITAL_COMMUNITY): Payer: PPO | Admitting: Anesthesiology

## 2019-06-16 ENCOUNTER — Encounter (HOSPITAL_COMMUNITY)
Admission: RE | Disposition: A | Discharge status: Home / Self Care | Payer: Self-pay | Source: Home / Self Care | Attending: Orthopedic Surgery

## 2019-06-16 ENCOUNTER — Encounter (HOSPITAL_COMMUNITY): Payer: Self-pay | Admitting: Emergency Medicine

## 2019-06-16 ENCOUNTER — Other Ambulatory Visit: Payer: Self-pay

## 2019-06-16 ENCOUNTER — Observation Stay (HOSPITAL_COMMUNITY)
Admission: RE | Admit: 2019-06-16 | Discharge: 2019-06-18 | Disposition: A | Discharge status: Home / Self Care | Payer: PPO | Attending: Orthopedic Surgery | Admitting: Orthopedic Surgery

## 2019-06-16 ENCOUNTER — Ambulatory Visit (HOSPITAL_COMMUNITY): Payer: PPO | Admitting: Physician Assistant

## 2019-06-16 DIAGNOSIS — Z7984 Long term (current) use of oral hypoglycemic drugs: Secondary | ICD-10-CM | POA: Insufficient documentation

## 2019-06-16 DIAGNOSIS — E039 Hypothyroidism, unspecified: Secondary | ICD-10-CM | POA: Diagnosis not present

## 2019-06-16 DIAGNOSIS — Z79899 Other long term (current) drug therapy: Secondary | ICD-10-CM | POA: Insufficient documentation

## 2019-06-16 DIAGNOSIS — M1711 Unilateral primary osteoarthritis, right knee: Principal | ICD-10-CM | POA: Insufficient documentation

## 2019-06-16 DIAGNOSIS — Z9049 Acquired absence of other specified parts of digestive tract: Secondary | ICD-10-CM | POA: Insufficient documentation

## 2019-06-16 DIAGNOSIS — M5412 Radiculopathy, cervical region: Secondary | ICD-10-CM | POA: Diagnosis not present

## 2019-06-16 DIAGNOSIS — E1122 Type 2 diabetes mellitus with diabetic chronic kidney disease: Secondary | ICD-10-CM | POA: Diagnosis not present

## 2019-06-16 DIAGNOSIS — I129 Hypertensive chronic kidney disease with stage 1 through stage 4 chronic kidney disease, or unspecified chronic kidney disease: Secondary | ICD-10-CM | POA: Insufficient documentation

## 2019-06-16 DIAGNOSIS — N189 Chronic kidney disease, unspecified: Secondary | ICD-10-CM | POA: Diagnosis not present

## 2019-06-16 DIAGNOSIS — Z888 Allergy status to other drugs, medicaments and biological substances status: Secondary | ICD-10-CM | POA: Diagnosis not present

## 2019-06-16 DIAGNOSIS — E1140 Type 2 diabetes mellitus with diabetic neuropathy, unspecified: Secondary | ICD-10-CM | POA: Diagnosis not present

## 2019-06-16 DIAGNOSIS — Z86010 Personal history of colonic polyps: Secondary | ICD-10-CM | POA: Insufficient documentation

## 2019-06-16 DIAGNOSIS — Z87442 Personal history of urinary calculi: Secondary | ICD-10-CM | POA: Insufficient documentation

## 2019-06-16 DIAGNOSIS — Z7982 Long term (current) use of aspirin: Secondary | ICD-10-CM | POA: Insufficient documentation

## 2019-06-16 DIAGNOSIS — Z96659 Presence of unspecified artificial knee joint: Secondary | ICD-10-CM

## 2019-06-16 HISTORY — PX: TOTAL KNEE ARTHROPLASTY: SHX125

## 2019-06-16 LAB — GLUCOSE, CAPILLARY
Glucose-Capillary: 118 mg/dL — ABNORMAL HIGH (ref 70–99)
Glucose-Capillary: 116 mg/dL — ABNORMAL HIGH (ref 70–99)
Glucose-Capillary: 219 mg/dL — ABNORMAL HIGH (ref 70–99)

## 2019-06-16 SURGERY — ARTHROPLASTY, KNEE, TOTAL
Anesthesia: Spinal | Site: Knee | Laterality: Right

## 2019-06-16 MED ORDER — FENTANYL CITRATE (PF) 100 MCG/2ML IJ SOLN
50.0000 ug | INTRAMUSCULAR | Status: DC
  Administered 2019-06-16: 08:00:00 50 ug via INTRAVENOUS
  Filled 2019-06-16: qty 2

## 2019-06-16 MED ORDER — EPHEDRINE 5 MG/ML INJ
INTRAVENOUS | Status: AC
  Filled 2019-06-16: qty 10

## 2019-06-16 MED ORDER — DEXAMETHASONE SODIUM PHOSPHATE 10 MG/ML IJ SOLN
INTRAMUSCULAR | Status: AC
  Filled 2019-06-16: qty 2

## 2019-06-16 MED ORDER — BUPIVACAINE-EPINEPHRINE 0.25% -1:200000 IJ SOLN
INTRAMUSCULAR | Status: DC | PRN
  Administered 2019-06-16: 30 mL

## 2019-06-16 MED ORDER — PHENYLEPHRINE 40 MCG/ML (10ML) SYRINGE FOR IV PUSH (FOR BLOOD PRESSURE SUPPORT)
PREFILLED_SYRINGE | INTRAVENOUS | Status: DC | PRN
  Administered 2019-06-16 (×2): 120 ug via INTRAVENOUS
  Administered 2019-06-16: 80 ug via INTRAVENOUS
  Administered 2019-06-16: 40 ug via INTRAVENOUS
  Administered 2019-06-16 (×2): 80 ug via INTRAVENOUS

## 2019-06-16 MED ORDER — LOSARTAN POTASSIUM 50 MG PO TABS
50.0000 mg | ORAL_TABLET | Freq: Every day | ORAL | Status: DC
  Administered 2019-06-18: 50 mg via ORAL
  Filled 2019-06-16 (×2): qty 1

## 2019-06-16 MED ORDER — METFORMIN HCL 500 MG PO TABS
500.0000 mg | ORAL_TABLET | Freq: Two times a day (BID) | ORAL | Status: DC
  Administered 2019-06-16 – 2019-06-18 (×4): 500 mg via ORAL
  Filled 2019-06-16 (×4): qty 1

## 2019-06-16 MED ORDER — DEXAMETHASONE SODIUM PHOSPHATE 10 MG/ML IJ SOLN
INTRAMUSCULAR | Status: DC | PRN
  Administered 2019-06-16: 8 mg via INTRAVENOUS

## 2019-06-16 MED ORDER — LIDOCAINE 2% (20 MG/ML) 5 ML SYRINGE
INTRAMUSCULAR | Status: AC
  Filled 2019-06-16: qty 10

## 2019-06-16 MED ORDER — METHOCARBAMOL 500 MG IVPB - SIMPLE MED
500.0000 mg | Freq: Four times a day (QID) | INTRAVENOUS | Status: DC | PRN
  Filled 2019-06-16: qty 50

## 2019-06-16 MED ORDER — PHENYLEPHRINE HCL (PRESSORS) 10 MG/ML IV SOLN
INTRAVENOUS | Status: AC
  Filled 2019-06-16: qty 7

## 2019-06-16 MED ORDER — OXYCODONE HCL 5 MG/5ML PO SOLN: 5.0000 mg | Freq: Once | ORAL | Status: DC | PRN

## 2019-06-16 MED ORDER — METOCLOPRAMIDE HCL 5 MG PO TABS
5.0000 mg | ORAL_TABLET | Freq: Three times a day (TID) | ORAL | Status: DC | PRN
  Administered 2019-06-17: 15:00:00 5 mg via ORAL
  Filled 2019-06-16: qty 1

## 2019-06-16 MED ORDER — BISACODYL 5 MG PO TBEC: 5.0000 mg | DELAYED_RELEASE_TABLET | Freq: Every day | ORAL | Status: DC | PRN

## 2019-06-16 MED ORDER — ALUM & MAG HYDROXIDE-SIMETH 200-200-20 MG/5ML PO SUSP: 30.0000 mL | ORAL | Status: DC | PRN

## 2019-06-16 MED ORDER — PROPOFOL 10 MG/ML IV BOLUS
INTRAVENOUS | Status: AC
  Filled 2019-06-16: qty 60

## 2019-06-16 MED ORDER — DEXAMETHASONE SODIUM PHOSPHATE 10 MG/ML IJ SOLN: 8.0000 mg | Freq: Once | INTRAMUSCULAR | Status: DC

## 2019-06-16 MED ORDER — ONDANSETRON HCL 4 MG/2ML IJ SOLN
INTRAMUSCULAR | Status: DC | PRN
  Administered 2019-06-16: 4 mg via INTRAVENOUS

## 2019-06-16 MED ORDER — 0.9 % SODIUM CHLORIDE (POUR BTL) OPTIME
TOPICAL | Status: DC | PRN
  Administered 2019-06-16: 1000 mL

## 2019-06-16 MED ORDER — TRAMADOL HCL 50 MG PO TABS
50.0000 mg | ORAL_TABLET | Freq: Four times a day (QID) | ORAL | Status: DC
  Administered 2019-06-16 – 2019-06-17 (×5): 50 mg via ORAL
  Filled 2019-06-16 (×8): qty 1

## 2019-06-16 MED ORDER — DIPHENHYDRAMINE HCL 12.5 MG/5ML PO ELIX: 12.5000 mg | ORAL_SOLUTION | ORAL | Status: DC | PRN

## 2019-06-16 MED ORDER — HYDROMORPHONE HCL 1 MG/ML IJ SOLN: 0.5000 mg | INTRAMUSCULAR | Status: DC | PRN

## 2019-06-16 MED ORDER — BUPIVACAINE-EPINEPHRINE (PF) 0.25% -1:200000 IJ SOLN
INTRAMUSCULAR | Status: AC
  Filled 2019-06-16: qty 30

## 2019-06-16 MED ORDER — FENTANYL CITRATE (PF) 100 MCG/2ML IJ SOLN: 25.0000 ug | INTRAMUSCULAR | Status: DC | PRN

## 2019-06-16 MED ORDER — CEFAZOLIN SODIUM-DEXTROSE 2-4 GM/100ML-% IV SOLN
2.0000 g | Freq: Four times a day (QID) | INTRAVENOUS | Status: AC
  Administered 2019-06-16 (×2): 2 g via INTRAVENOUS
  Filled 2019-06-16 (×2): qty 100

## 2019-06-16 MED ORDER — BUPIVACAINE LIPOSOME 1.3 % IJ SUSP
INTRAMUSCULAR | Status: DC | PRN
  Administered 2019-06-16: 20 mL

## 2019-06-16 MED ORDER — OXYCODONE HCL 5 MG PO TABS: 5.0000 mg | ORAL_TABLET | ORAL | Status: DC | PRN

## 2019-06-16 MED ORDER — ACETAMINOPHEN 500 MG PO TABS
1000.0000 mg | ORAL_TABLET | Freq: Four times a day (QID) | ORAL | Status: AC
  Administered 2019-06-16 – 2019-06-17 (×4): 1000 mg via ORAL
  Filled 2019-06-16 (×5): qty 2

## 2019-06-16 MED ORDER — FENTANYL CITRATE (PF) 250 MCG/5ML IJ SOLN: INTRAMUSCULAR | Status: DC | PRN

## 2019-06-16 MED ORDER — OXYCODONE HCL 5 MG PO TABS: 5.0000 mg | ORAL_TABLET | Freq: Once | ORAL | Status: DC | PRN

## 2019-06-16 MED ORDER — CEFAZOLIN SODIUM-DEXTROSE 2-4 GM/100ML-% IV SOLN
2.0000 g | INTRAVENOUS | Status: AC
  Administered 2019-06-16: 2 g via INTRAVENOUS
  Filled 2019-06-16: qty 100

## 2019-06-16 MED ORDER — PHENYLEPHRINE 40 MCG/ML (10ML) SYRINGE FOR IV PUSH (FOR BLOOD PRESSURE SUPPORT)
PREFILLED_SYRINGE | INTRAVENOUS | Status: AC
  Filled 2019-06-16: qty 10

## 2019-06-16 MED ORDER — GABAPENTIN 300 MG PO CAPS
300.0000 mg | ORAL_CAPSULE | Freq: Three times a day (TID) | ORAL | Status: DC
  Administered 2019-06-16 – 2019-06-18 (×6): 300 mg via ORAL
  Filled 2019-06-16 (×7): qty 1

## 2019-06-16 MED ORDER — SODIUM CHLORIDE 0.9 % IR SOLN
Status: DC | PRN
  Administered 2019-06-16 (×2): 1000 mL

## 2019-06-16 MED ORDER — ACETAMINOPHEN 500 MG PO TABS
1000.0000 mg | ORAL_TABLET | Freq: Once | ORAL | Status: AC
  Administered 2019-06-16: 1000 mg via ORAL
  Filled 2019-06-16: qty 2

## 2019-06-16 MED ORDER — CHLORHEXIDINE GLUCONATE 4 % EX LIQD: 60.0000 mL | Freq: Once | CUTANEOUS | Status: DC

## 2019-06-16 MED ORDER — LOSARTAN POTASSIUM-HCTZ 100-25 MG PO TABS: 0.5000 | ORAL_TABLET | Freq: Every day | ORAL | Status: DC

## 2019-06-16 MED ORDER — CELECOXIB 200 MG PO CAPS
400.0000 mg | ORAL_CAPSULE | Freq: Once | ORAL | Status: AC
  Administered 2019-06-16: 07:00:00 400 mg via ORAL
  Filled 2019-06-16: qty 2

## 2019-06-16 MED ORDER — PANTOPRAZOLE SODIUM 40 MG PO TBEC
40.0000 mg | DELAYED_RELEASE_TABLET | Freq: Every day | ORAL | Status: DC
  Administered 2019-06-16 – 2019-06-18 (×3): 40 mg via ORAL
  Filled 2019-06-16 (×3): qty 1

## 2019-06-16 MED ORDER — PHENOL 1.4 % MT LIQD: 1.0000 | OROMUCOSAL | Status: DC | PRN

## 2019-06-16 MED ORDER — SUCCINYLCHOLINE CHLORIDE 200 MG/10ML IV SOSY
PREFILLED_SYRINGE | INTRAVENOUS | Status: AC
  Filled 2019-06-16: qty 20

## 2019-06-16 MED ORDER — SODIUM CHLORIDE 0.9 % IV SOLN
INTRAVENOUS | Status: DC
  Administered 2019-06-16 – 2019-06-17 (×4): via INTRAVENOUS

## 2019-06-16 MED ORDER — WATER FOR IRRIGATION, STERILE IR SOLN
Status: DC | PRN
  Administered 2019-06-16: 2000 mL

## 2019-06-16 MED ORDER — ONDANSETRON HCL 4 MG PO TABS
4.0000 mg | ORAL_TABLET | Freq: Four times a day (QID) | ORAL | Status: DC | PRN
  Administered 2019-06-17: 4 mg via ORAL
  Filled 2019-06-16: qty 1

## 2019-06-16 MED ORDER — METOCLOPRAMIDE HCL 5 MG/ML IJ SOLN: 5.0000 mg | Freq: Three times a day (TID) | INTRAMUSCULAR | Status: DC | PRN

## 2019-06-16 MED ORDER — PROPOFOL 10 MG/ML IV BOLUS
INTRAVENOUS | Status: DC | PRN
  Administered 2019-06-16 (×6): 20 mg via INTRAVENOUS

## 2019-06-16 MED ORDER — ZOLPIDEM TARTRATE 5 MG PO TABS: 5.0000 mg | ORAL_TABLET | Freq: Every evening | ORAL | Status: DC | PRN

## 2019-06-16 MED ORDER — PROPOFOL 500 MG/50ML IV EMUL
INTRAVENOUS | Status: DC | PRN
  Administered 2019-06-16: 50 ug/kg/min via INTRAVENOUS

## 2019-06-16 MED ORDER — ONDANSETRON HCL 4 MG/2ML IJ SOLN: 4.0000 mg | Freq: Once | INTRAMUSCULAR | Status: DC | PRN

## 2019-06-16 MED ORDER — MIDAZOLAM HCL 2 MG/2ML IJ SOLN
INTRAMUSCULAR | Status: AC
  Filled 2019-06-16: qty 2

## 2019-06-16 MED ORDER — ONDANSETRON HCL 4 MG/2ML IJ SOLN
INTRAMUSCULAR | Status: AC
  Filled 2019-06-16: qty 4

## 2019-06-16 MED ORDER — TRANEXAMIC ACID-NACL 1000-0.7 MG/100ML-% IV SOLN
1000.0000 mg | Freq: Once | INTRAVENOUS | Status: AC
  Administered 2019-06-16: 13:00:00 1000 mg via INTRAVENOUS
  Filled 2019-06-16: qty 100

## 2019-06-16 MED ORDER — ASPIRIN EC 325 MG PO TBEC
325.0000 mg | DELAYED_RELEASE_TABLET | Freq: Two times a day (BID) | ORAL | Status: DC
  Administered 2019-06-17 – 2019-06-18 (×3): 325 mg via ORAL
  Filled 2019-06-16 (×3): qty 1

## 2019-06-16 MED ORDER — LIDOCAINE 2% (20 MG/ML) 5 ML SYRINGE
INTRAMUSCULAR | Status: DC | PRN
  Administered 2019-06-16: 40 mg via INTRAVENOUS

## 2019-06-16 MED ORDER — SODIUM CHLORIDE (PF) 0.9 % IJ SOLN
INTRAMUSCULAR | Status: AC
  Filled 2019-06-16: qty 20

## 2019-06-16 MED ORDER — GABAPENTIN 300 MG PO CAPS
300.0000 mg | ORAL_CAPSULE | Freq: Once | ORAL | Status: AC
  Administered 2019-06-16: 300 mg via ORAL
  Filled 2019-06-16: qty 1

## 2019-06-16 MED ORDER — POVIDONE-IODINE 10 % EX SWAB
2.0000 "application " | Freq: Once | CUTANEOUS | Status: AC
  Administered 2019-06-16: 2 via TOPICAL

## 2019-06-16 MED ORDER — METHOCARBAMOL 500 MG PO TABS: 500.0000 mg | ORAL_TABLET | Freq: Four times a day (QID) | ORAL | Status: DC | PRN

## 2019-06-16 MED ORDER — MIDAZOLAM HCL 2 MG/2ML IJ SOLN
1.0000 mg | INTRAMUSCULAR | Status: DC
  Filled 2019-06-16: qty 2

## 2019-06-16 MED ORDER — HYDROCHLOROTHIAZIDE 12.5 MG PO CAPS
12.5000 mg | ORAL_CAPSULE | Freq: Every day | ORAL | Status: DC
  Administered 2019-06-18: 09:00:00 12.5 mg via ORAL
  Filled 2019-06-16 (×2): qty 1

## 2019-06-16 MED ORDER — ROCURONIUM BROMIDE 10 MG/ML (PF) SYRINGE
PREFILLED_SYRINGE | INTRAVENOUS | Status: AC
  Filled 2019-06-16: qty 10

## 2019-06-16 MED ORDER — FENTANYL CITRATE (PF) 250 MCG/5ML IJ SOLN
INTRAMUSCULAR | Status: AC
  Filled 2019-06-16: qty 5

## 2019-06-16 MED ORDER — FLEET ENEMA 7-19 GM/118ML RE ENEM: 1.0000 | ENEMA | Freq: Once | RECTAL | Status: DC | PRN

## 2019-06-16 MED ORDER — ONDANSETRON HCL 4 MG/2ML IJ SOLN: 4.0000 mg | Freq: Four times a day (QID) | INTRAMUSCULAR | Status: DC | PRN

## 2019-06-16 MED ORDER — SENNOSIDES-DOCUSATE SODIUM 8.6-50 MG PO TABS: 1.0000 | ORAL_TABLET | Freq: Every evening | ORAL | Status: DC | PRN

## 2019-06-16 MED ORDER — TRANEXAMIC ACID-NACL 1000-0.7 MG/100ML-% IV SOLN
1000.0000 mg | INTRAVENOUS | Status: AC
  Administered 2019-06-16: 1000 mg via INTRAVENOUS
  Filled 2019-06-16: qty 100

## 2019-06-16 MED ORDER — SODIUM CHLORIDE 0.9% FLUSH
INTRAVENOUS | Status: DC | PRN
  Administered 2019-06-16: 20 mL via INTRAVENOUS

## 2019-06-16 MED ORDER — BUPIVACAINE IN DEXTROSE 0.75-8.25 % IT SOLN
INTRATHECAL | Status: DC | PRN
  Administered 2019-06-16: 1.4 mL via INTRATHECAL

## 2019-06-16 MED ORDER — DEXAMETHASONE SODIUM PHOSPHATE 10 MG/ML IJ SOLN: 10.0000 mg | Freq: Once | INTRAMUSCULAR | Status: DC

## 2019-06-16 MED ORDER — FERROUS SULFATE 325 (65 FE) MG PO TABS
325.0000 mg | ORAL_TABLET | Freq: Three times a day (TID) | ORAL | Status: DC
  Administered 2019-06-17 – 2019-06-18 (×3): 325 mg via ORAL
  Filled 2019-06-16 (×3): qty 1

## 2019-06-16 MED ORDER — DOCUSATE SODIUM 100 MG PO CAPS
100.0000 mg | ORAL_CAPSULE | Freq: Two times a day (BID) | ORAL | Status: DC
  Administered 2019-06-16 – 2019-06-18 (×4): 100 mg via ORAL
  Filled 2019-06-16 (×5): qty 1

## 2019-06-16 MED ORDER — LACTATED RINGERS IV SOLN
INTRAVENOUS | Status: DC
  Administered 2019-06-16: 1000 mL via INTRAVENOUS
  Administered 2019-06-16 (×2): via INTRAVENOUS

## 2019-06-16 MED ORDER — MENTHOL 3 MG MT LOZG: 1.0000 | LOZENGE | OROMUCOSAL | Status: DC | PRN

## 2019-06-16 SURGICAL SUPPLY — 61 items
ARTISURF 11M PLY R 6-9EF KNEE (Knees) ×2 IMPLANT
BAG SPEC THK2 15X12 ZIP CLS (MISCELLANEOUS)
BAG ZIPLOCK 12X15 (MISCELLANEOUS) ×1 IMPLANT
BANDAGE ACE 6X5 VEL STRL LF (GAUZE/BANDAGES/DRESSINGS) ×3 IMPLANT
BLADE SAGITTAL 13X1.27X60 (BLADE) ×2 IMPLANT
BLADE SAGITTAL 13X1.27X60MM (BLADE) ×1
BLADE SAW SGTL 83.5X18.5 (BLADE) ×3 IMPLANT
BLADE SURG 15 STRL LF DISP TIS (BLADE) ×1 IMPLANT
BLADE SURG 15 STRL SS (BLADE) ×3
BOWL SMART MIX CTS (DISPOSABLE) ×3 IMPLANT
BSPLAT TIB 5D F CMNT STM RT (Knees) ×1 IMPLANT
CATH FOLEY 2WAY SLVR  5CC 14FR (CATHETERS) ×2
CATH FOLEY 2WAY SLVR 5CC 14FR (CATHETERS) IMPLANT
CEMENT BONE SIMPLEX SPEEDSET (Cement) ×6 IMPLANT
CLOSURE WOUND 1/2 X4 (GAUZE/BANDAGES/DRESSINGS) ×2
COVER SURGICAL LIGHT HANDLE (MISCELLANEOUS) ×3 IMPLANT
COVER WAND RF STERILE (DRAPES) IMPLANT
CUFF TOURN SGL QUICK 34 (TOURNIQUET CUFF) ×3
CUFF TRNQT CYL 34X4.125X (TOURNIQUET CUFF) ×1 IMPLANT
DECANTER SPIKE VIAL GLASS SM (MISCELLANEOUS) ×6 IMPLANT
DRAPE INCISE IOBAN 66X45 STRL (DRAPES) ×6 IMPLANT
DRAPE U-SHAPE 47X51 STRL (DRAPES) ×3 IMPLANT
DRILL PIN HEADLESS TROCAR 3X75 (PIN) ×2 IMPLANT
DRSG AQUACEL AG ADV 3.5X10 (GAUZE/BANDAGES/DRESSINGS) ×3 IMPLANT
DURAPREP 26ML APPLICATOR (WOUND CARE) ×6 IMPLANT
ELECT REM PT RETURN 15FT ADLT (MISCELLANEOUS) ×3 IMPLANT
FEMUR  CMT CCR STD SZ9 R KNEE (Knees) ×2 IMPLANT
FEMUR CMT CCR STD SZ9 R KNEE (Knees) ×1 IMPLANT
FEMUR CMTD CCR STD SZ9 R KNEE (Knees) IMPLANT
GLOVE BIOGEL M STRL SZ7.5 (GLOVE) ×3 IMPLANT
GLOVE BIOGEL PI IND STRL 7.5 (GLOVE) ×1 IMPLANT
GLOVE BIOGEL PI IND STRL 8.5 (GLOVE) ×2 IMPLANT
GLOVE BIOGEL PI INDICATOR 7.5 (GLOVE) ×2
GLOVE BIOGEL PI INDICATOR 8.5 (GLOVE) ×4
GLOVE SURG ORTHO 8.0 STRL STRW (GLOVE) ×9 IMPLANT
GOWN STRL REUS W/ TWL XL LVL3 (GOWN DISPOSABLE) ×2 IMPLANT
GOWN STRL REUS W/TWL XL LVL3 (GOWN DISPOSABLE) ×6
HANDPIECE INTERPULSE COAX TIP (DISPOSABLE) ×3
HOLDER FOLEY CATH W/STRAP (MISCELLANEOUS) ×3 IMPLANT
HOOD PEEL AWAY FLYTE STAYCOOL (MISCELLANEOUS) ×9 IMPLANT
KIT TURNOVER KIT A (KITS) IMPLANT
MANIFOLD NEPTUNE II (INSTRUMENTS) ×3 IMPLANT
NEEDLE HYPO 22GX1.5 SAFETY (NEEDLE) ×3 IMPLANT
PACK TOTAL KNEE CUSTOM (KITS) ×3 IMPLANT
PROTECTOR NERVE ULNAR (MISCELLANEOUS) ×3 IMPLANT
SET HNDPC FAN SPRY TIP SCT (DISPOSABLE) ×1 IMPLANT
STEM POLY PAT PLY 41M KNEE (Knees) ×2 IMPLANT
STEM TIBIA 5 DEG SZ F R KNEE (Knees) IMPLANT
STRIP CLOSURE SKIN 1/2X4 (GAUZE/BANDAGES/DRESSINGS) ×3 IMPLANT
SUT BONE WAX W31G (SUTURE) ×3 IMPLANT
SUT MNCRL AB 3-0 PS2 18 (SUTURE) ×3 IMPLANT
SUT STRATAFIX 0 PDS 27 VIOLET (SUTURE) ×3
SUT STRATAFIX PDS+ 0 24IN (SUTURE) ×3 IMPLANT
SUT VIC AB 1 CT1 36 (SUTURE) ×5 IMPLANT
SUTURE STRATFX 0 PDS 27 VIOLET (SUTURE) ×1 IMPLANT
SYR CONTROL 10ML LL (SYRINGE) ×6 IMPLANT
TIBIA STEM 5 DEG SZ F R KNEE (Knees) ×3 IMPLANT
TRAY FOLEY MTR SLVR 16FR STAT (SET/KITS/TRAYS/PACK) ×3 IMPLANT
WATER STERILE IRR 1000ML POUR (IV SOLUTION) ×6 IMPLANT
WRAP KNEE MAXI GEL POST OP (GAUZE/BANDAGES/DRESSINGS) ×3 IMPLANT
YANKAUER SUCT BULB TIP 10FT TU (MISCELLANEOUS) ×3 IMPLANT

## 2019-06-16 NOTE — H&P (Signed)
Terrence Cisneros MRN:  [id number] DOB/SEX:  21-Jul-1941/male  CHIEF COMPLAINT:  Painful right Knee  HISTORY: Patient is a 78 y.o. male presented with a history of pain in the right knee. Onset of symptoms was gradual starting a few years ago with gradually worsening course since that time. Patient has been treated conservatively with over-the-counter NSAIDs and activity modification. Patient currently rates pain in the knee at 10 out of 10 with activity. There is pain at night.  PAST MEDICAL HISTORY: Patient Active Problem List   Diagnosis Date Noted  . Controlled type 2 diabetes mellitus with hyperglycemia, without long-term current use of insulin ([place]) 01/31/2019  . Cellulitis of right foot 03/05/2018  . Localized edema 02/27/2018  . Pre-ulcerative calluses 02/27/2018  . 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 Arthritis 03/13/2017  . Bilateral carpal tunnel syndrome 03/13/2017  . Clumsiness 03/13/2017  . Right elbow pain 08/29/2016  . History of cholecystectomy 02/22/2012   Past Medical History:  Diagnosis Date  . Acquired hypothyroidism   . Arthritis   . Cervical radiculopathy    "fingers get cold and my neck pops when i turn it dide to side, theyre going to send me for a nerve induction next month"  . Chronic kidney disease    "they told me a couple years ago  to avoid taking ibuprofen because my kidneys werent 100%"  . Environmental allergies   . Hypertension   . Neuropathy    "ive got numbness in my left foot "   . Type 2 diabetes mellitus ([place])    Past Surgical History:  Procedure Laterality Date  . CHOLECYSTECTOMY  02/03/2012   Procedure: LAPAROSCOPIC CHOLECYSTECTOMY WITH INTRAOPERATIVE CHOLANGIOGRAM;  Surgeon: Shann Medal, MD;  Location: [facility] ORS;  Service: General;  Laterality: N/A;  . COLONOSCOPY W/ POLYPECTOMY    . KIDNEY STONE SURGERY  90s   some i passed, some they blasted, some they went in to get   . TONSILLECTOMY       MEDICATIONS:   Medications Prior to Admission   Medication Sig Dispense Refill Last Dose  . acetaminophen (TYLENOL) 650 MG CR tablet Take 1,300 mg by mouth every 8 (eight) hours as needed for pain.     Marland Kitchen aspirin EC 81 MG tablet Take 81 mg by mouth every Monday, Wednesday, and Friday.      . losartan-hydrochlorothiazide (HYZAAR) 100-25 MG tablet Take 0.5 tablets by mouth daily.      . metFORMIN (GLUCOPHAGE) 500 MG tablet Take 500 mg by mouth 2 (two) times daily with a meal.        ALLERGIES:   Allergies  Allergen Reactions  . Lisinopril Other (See Comments)    BP dropped very low    REVIEW OF SYSTEMS:  A comprehensive review of systems was negative except for: Musculoskeletal: positive for arthralgias and bone pain   FAMILY HISTORY:  No family history on file.  SOCIAL HISTORY:   Social History   Tobacco Use  . Smoking status: Never Smoker  . Smokeless tobacco: Never Used  Substance Use Topics  . Alcohol use: No     EXAMINATION:  Vital signs in last 24 hours:    There were no vitals taken for this visit.  General Appearance:    Alert, cooperative, no distress, appears stated age  Head:    Normocephalic, without obvious abnormality, atraumatic  Eyes:    PERRL, conjunctiva/corneas clear, EOM's intact, fundi    benign, both eyes       Ears:  Normal TM's and external ear canals, both ears  Nose:   Nares normal, septum midline, mucosa normal, no drainage    or sinus tenderness  Throat:   Lips, mucosa, and tongue normal; teeth and gums normal  Neck:   Supple, symmetrical, trachea midline, no adenopathy;       thyroid:  No enlargement/tenderness/nodules; no carotid   bruit or JVD  Back:     Symmetric, no curvature, ROM normal, no CVA tenderness  Lungs:     Clear to auscultation bilaterally, respirations unlabored  Chest wall:    No tenderness or deformity  Heart:    Regular rate and rhythm, S1 and S2 normal, no murmur, rub   or gallop  Abdomen:     Soft, non-tender, bowel sounds active all four quadrants,    no  masses, no organomegaly  Genitalia:    Normal male without lesion, discharge or tenderness  Rectal:    Normal tone, normal prostate, no masses or tenderness;   guaiac negative stool  Extremities:   Extremities normal, atraumatic, no cyanosis or edema  Pulses:   2+ and symmetric all extremities  Skin:   Skin color, texture, turgor normal, no rashes or lesions  Lymph nodes:   Cervical, supraclavicular, and axillary nodes normal  Neurologic:   CNII-XII intact. Normal strength, sensation and reflexes      throughout    Musculoskeletal:  ROM 0-120, Ligaments intact,  Imaging Review Plain radiographs demonstrate severe degenerative joint disease of the right knee. The overall alignment is neutral. The bone quality appears to be good for age and reported activity level.  Assessment/Plan: Primary osteoarthritis, right knee   The patient history, physical examination and imaging studies are consistent with advanced degenerative joint disease of the right knee. The patient has failed conservative treatment.  The clearance notes were reviewed.  After discussion with the patient it was felt that Total Knee Replacement was indicated. The procedure,  risks, and benefits of total knee arthroplasty were presented and reviewed. The risks including but not limited to aseptic loosening, infection, blood clots, vascular injury, stiffness, patella tracking problems complications among others were discussed. The patient acknowledged the explanation, agreed to proceed with the plan.  Preoperative templating of the joint replacement has been completed, documented, and submitted to the Operating Room personnel in order to optimize intra-operative equipment management.    Patient's anticipated LOS is less than 2 midnights, meeting these requirements: - Lives within 1 hour of care - Has a competent adult at home to recover with post-op recover - NO history of  - Chronic pain requiring opiods  - Diabetes  -  Coronary Artery Disease  - Heart failure  - Heart attack  - Stroke  - DVT/VTE  - Cardiac arrhythmia  - Respiratory Failure/COPD  - Renal failure  - Anemia  - Advanced Liver disease       Donia Ast 06/16/2019, 6:15 AM

## 2019-06-16 NOTE — Anesthesia Procedure Notes (Signed)
Date/Time: 06/16/2019 8:05 AM Performed by: Cynda Familia, CRNA Pre-anesthesia Checklist: Patient identified, Emergency Drugs available, Suction available, Patient being monitored and Timeout performed Oxygen Delivery Method: Simple face mask Placement Confirmation: positive ETCO2 and breath sounds checked- equal and bilateral Dental Injury: Teeth and Oropharynx as per pre-operative assessment

## 2019-06-16 NOTE — Anesthesia Procedure Notes (Signed)
Spinal  Patient location during procedure: OR Start time: 06/16/2019 8:04 AM End time: 06/16/2019 8:10 AM Staffing Anesthesiologist: Audry Pili, MD Performed: anesthesiologist  Preanesthetic Checklist Completed: patient identified, surgical consent, pre-op evaluation, timeout performed, IV checked, risks and benefits discussed and monitors and equipment checked Spinal Block Patient position: sitting Prep: DuraPrep Patient monitoring: heart rate, cardiac monitor, continuous pulse ox and blood pressure Approach: midline Location: L3-4 Injection technique: single-shot Needle Needle type: Quincke  Needle gauge: 22 G Additional Notes Consent was obtained prior to the procedure with all questions answered and concerns addressed. Risks including, but not limited to, bleeding, infection, nerve damage, paralysis, failed block, inadequate analgesia, allergic reaction, high spinal, itching, and headache were discussed and the patient wished to proceed. Functioning IV was confirmed and monitors were applied. Sterile prep and drape, including hand hygiene, mask, and sterile gloves were used. The patient was positioned and the spine was prepped. The skin was anesthetized with lidocaine. First two attempts by CRNA unsuccessful. Third attempt by Dr. Fransisco Beau successful. Free flow of clear CSF was obtained prior to injecting local anesthetic into the CSF. The spinal needle aspirated freely following injection. The needle was carefully withdrawn. The patient tolerated the procedure well.   Renold Don, MD

## 2019-06-16 NOTE — Evaluation (Signed)
Physical Therapy Evaluation Patient Details Name: Terrence Cisneros MRN: [id number] DOB: [birth year] Today's Date: 06/16/2019   History of Present Illness  78 yo male s/p R TKR on 06/16/19. PMH includes DMII, carpal tunnel, cervical radiculopathy, OA, CKD, HTN, L foot neuropathy.  Clinical Impression  Pt presents with R knee pain, decreased R knee ROM, increased time and effort to perform mobility tasks, and decreased activity tolerance post-surgery. Pt to benefit from acute PT to address deficits. Pt ambulated hallway distance with RW with min guard assist, verbal cuing for form and safety provided throughout. Pt educated on ankle pumps (20/hour) to perform this afternoon/evening to increase circulation, to pt's tolerance and limited by pain. PT to progress mobility as tolerated, and will continue to follow acutely.        Follow Up Recommendations Follow surgeon's recommendation for DC plan and follow-up therapies;Supervision for mobility/OOB    Equipment Recommendations  None recommended by PT    Recommendations for Other Services       Precautions / Restrictions Precautions Precautions: Fall Restrictions Weight Bearing Restrictions: No Other Position/Activity Restrictions: WBAT      Mobility  Bed Mobility Overal bed mobility: Needs Assistance Bed Mobility: Supine to Sit     Supine to sit: Min guard;HOB elevated     General bed mobility comments: Min guard for safety. Verbal cuing for sequencing, increased time.  Transfers Overall transfer level: Needs assistance Equipment used: Rolling walker (2 wheeled) Transfers: Sit to/from Stand Sit to Stand: Min guard;From elevated surface         General transfer comment: Min guard for safety, verbal cuing for hand placement when rising.  Ambulation/Gait Ambulation/Gait assistance: Min guard;+2 safety/equipment Gait Distance (Feet): 90 Feet Assistive device: Rolling walker (2 wheeled) Gait Pattern/deviations: Step-to  pattern;Step-through pattern;Trunk flexed Gait velocity: decr   General Gait Details: Min guard for safety, verbal cuing for upright posture, sequencing, turning with RW.  Stairs            Wheelchair Mobility    Modified Rankin (Stroke Patients Only)       Balance Overall balance assessment: Mild deficits observed, not formally tested                                           Pertinent Vitals/Pain Pain Assessment: 0-10 Pain Score: 2  Pain Location: R knee Pain Descriptors / Indicators: Sore Pain Intervention(s): Limited activity within patient's tolerance;Monitored during session;Premedicated before session;Repositioned;Ice applied    Home Living Family/patient expects to be discharged to:: Private residence Living Arrangements: Spouse/significant other Available Help at Discharge: Family Type of Home: House Home Access: Stairs to enter   Technical brewer of Steps: 3 Home Layout: One level Home Equipment: Environmental consultant - 2 wheels;Cane - single point;Other (comment)(CPM)      Prior Function Level of Independence: Independent         Comments: Pt reports being active in yard PTA     Hand Dominance   Dominant Hand: Right    Extremity/Trunk Assessment   Upper Extremity Assessment Upper Extremity Assessment: Overall WFL for tasks assessed    Lower Extremity Assessment Lower Extremity Assessment: Overall WFL for tasks assessed;RLE deficits/detail RLE Deficits / Details: suspected post-surgical weakness; able to perform ankle pumps, quad set, heel slide to 90*, SLR without lift assist or quad lag RLE Sensation: WNL    Cervical / Trunk Assessment Cervical / Trunk  Assessment: Normal  Communication   Communication: No difficulties  Cognition Arousal/Alertness: Awake/alert Behavior During Therapy: WFL for tasks assessed/performed Overall Cognitive Status: Within Functional Limits for tasks assessed                                         General Comments      Exercises     Assessment/Plan    PT Assessment Patient needs continued PT services  PT Problem List Decreased strength;Decreased range of motion;Decreased activity tolerance;Decreased balance;Decreased knowledge of use of DME;Decreased mobility;Pain       PT Treatment Interventions DME instruction;Therapeutic activities;Gait training;Therapeutic exercise;Patient/family education;Balance training;Stair training;Functional mobility training    PT Goals (Current goals can be found in the Care Plan section)  Acute Rehab PT Goals Patient Stated Goal: go home to wife PT Goal Formulation: With patient Time For Goal Achievement: 06/23/19 Potential to Achieve Goals: Good    Frequency 7X/week   Barriers to discharge        Co-evaluation               AM-PAC PT "6 Clicks" Mobility  Outcome Measure Help needed turning from your back to your side while in a flat bed without using bedrails?: A Little Help needed moving from lying on your back to sitting on the side of a flat bed without using bedrails?: A Little Help needed moving to and from a bed to a chair (including a wheelchair)?: A Little Help needed standing up from a chair using your arms (e.g., wheelchair or bedside chair)?: A Little Help needed to walk in hospital room?: A Little Help needed climbing 3-5 steps with a railing? : A Little 6 Click Score: 18    End of Session Equipment Utilized During Treatment: Gait belt Activity Tolerance: Patient tolerated treatment well Patient left: in chair;with call bell/phone within reach;with chair alarm set;with SCD's reapplied Nurse Communication: Mobility status PT Visit Diagnosis: Difficulty in walking, not elsewhere classified (R26.2);Other abnormalities of gait and mobility (R26.89)    Time: 2876-8115 PT Time Calculation (min) (ACUTE ONLY): 20 min   Charges:   PT Evaluation $PT Eval Low Complexity: 1 Low          Zelene Barga  Conception Chancy, PT Acute Rehabilitation Services Pager [phone]  Office [phone]   Zayley Arras D Zygmund Passero 06/16/2019, 7:30 PM

## 2019-06-16 NOTE — Progress Notes (Signed)
Assisted Dr. Brock with right, ultrasound guided, adductor canal block. Side rails up, monitors on throughout procedure. See vital signs in flow sheet. Tolerated Procedure well.  

## 2019-06-16 NOTE — Transfer of Care (Signed)
Immediate Anesthesia Transfer of Care Note  Patient: Terrence Cisneros  Procedure(s) Performed: TOTAL KNEE ARTHROPLASTY (Right Knee)  Patient Location: PACU  Anesthesia Type:Spinal  Level of Consciousness: sedated  Airway & Oxygen Therapy: Patient Spontanous Breathing and Patient connected to face mask oxygen  Post-op Assessment: Report given to RN and Post -op Vital signs reviewed and stable  Post vital signs: Reviewed and stable  Last Vitals:  Vitals Value Taken Time  BP 115/63 06/16/19 1011  Temp    Pulse 85 06/16/19 1013  Resp 13 06/16/19 1013  SpO2 100 % 06/16/19 1013  Vitals shown include unvalidated device data.  Last Pain:  Vitals:   06/16/19 0753  TempSrc:   PainSc: 0-No pain      Patients Stated Pain Goal: 4 (68/12/75 1700)  Complications: No apparent anesthesia complications

## 2019-06-16 NOTE — Anesthesia Postprocedure Evaluation (Signed)
Anesthesia Post Note  Patient: Terrence Cisneros  Procedure(s) Performed: TOTAL KNEE ARTHROPLASTY (Right Knee)     Patient location during evaluation: PACU Anesthesia Type: Spinal Level of consciousness: awake and alert Pain management: pain level controlled Vital Signs Assessment: post-procedure vital signs reviewed and stable Respiratory status: spontaneous breathing and respiratory function stable Cardiovascular status: blood pressure returned to baseline and stable Postop Assessment: spinal receding and no apparent nausea or vomiting Anesthetic complications: no    Last Vitals:  Vitals:   06/16/19 1100 06/16/19 1125  BP: (!) 142/73 138/76  Pulse: 78 80  Resp:  16  Temp: (!) 36.3 C   SpO2: 100% 100%    Last Pain:  Vitals:   06/16/19 1100  TempSrc:   PainSc: 0-No pain                 Audry Pili

## 2019-06-16 NOTE — Op Note (Signed)
TOTAL KNEE REPLACEMENT OPERATIVE NOTE:  06/16/2019  1:01 PM  PATIENT:  Terrence Cisneros  78 y.o. male  PRE-OPERATIVE DIAGNOSIS:  Primary osteoarthritis right knee  POST-OPERATIVE DIAGNOSIS:  Primary osteoarthritis right knee  PROCEDURE:  Procedure(s): TOTAL KNEE ARTHROPLASTY  SURGEON:  Surgeon(s): Vickey Huger, MD  PHYSICIAN ASSISTANT:  Nehemiah Massed, PA-C  ANESTHESIA:   spinal  SPECIMEN: None  COUNTS:  Correct  TOURNIQUET:   Total Tourniquet Time Documented: Thigh (Right) - 38 minutes Total: Thigh (Right) - 38 minutes   DICTATION:  Indication for procedure:    The patient is a 78 y.o. male who has failed conservative treatment for Primary osteoarthritis right knee.  Informed consent was obtained prior to anesthesia. The risks versus benefits of the operation were explain and in a way the patient can, and did, understand.    Description of procedure:     The patient was taken to the operating room and placed under anesthesia.  The patient was positioned in the usual fashion taking care that all body parts were adequately padded and/or protected.  A tourniquet was applied and the leg prepped and draped in the usual sterile fashion.  The extremity was exsanguinated with the esmarch and tourniquet inflated to 350 mmHg.  Pre-operative range of motion was normal.    A midline incision approximately 6-7 inches long was made with a #10 blade.  A new blade was used to make a parapatellar arthrotomy going 2-3 cm into the quadriceps tendon, over the patella, and alongside the medial aspect of the patellar tendon.  A synovectomy was then performed with the #10 blade and forceps. I then elevated the deep MCL off the medial tibial metaphysis subperiosteally around to the semimembranosus attachment.    I everted the patella and used calipers to measure patellar thickness.  I used the reamer to ream down to appropriate thickness to recreate the native thickness.  I then removed excess bone  with the rongeur and sagittal saw.  I used the appropriately sized template and drilled the three lug holes.  I then put the trial in place and measured the thickness with the calipers to ensure recreation of the native thickness.  The trial was then removed and the patella subluxed and the knee brought into flexion.  A homan retractor was place to retract and protect the patella and lateral structures.  A Z-retractor was place medially to protect the medial structures.  The extra-medullary alignment system was used to make cut the tibial articular surface perpendicular to the anamotic axis of the tibia and in 3 degrees of posterior slope.  The cut surface and alignment jig was removed.  I then used the intramedullary alignment guide to make a  valgus cut on the distal femur.  I then marked out the epicondylar axis on the distal femur. I then used the anterior referencing sizer and measured the femur to be a size 9.  The 4-In-1 cutting block was screwed into place in external rotation matching the posterior condylar angle, making our cuts perpendicular to the epicondylar axis.  Anterior, posterior and chamfer cuts were made with the sagittal saw.  The cutting block and cut pieces were removed.  A lamina spreader was placed in 90 degrees of flexion.  The ACL, PCL, menisci, and posterior condylar osteophytes were removed.  A 11 mm spacer blocked was found to offer good flexion and extension gap balance after minimal in degree releasing.   The scoop retractor was then placed and the femoral  finishing block was pinned in place.  The small sagittal saw was used as well as the lug drill to finish the femur.  The block and cut surfaces were removed and the medullary canal hole filled with autograft bone from the cut pieces.  The tibia was delivered forward in deep flexion and external rotation.  A size F tray was selected and pinned into place centered on the medial 1/3 of the tibial tubercle.  The reamer and keel  was used to prepare the tibia through the tray.    I then trialed with the size 9 femur, size F tibia, a 11 mm insert and the 41 patella.  I had excellent flexion/extension gap balance, excellent patella tracking.  Flexion was full and beyond 120 degrees; extension was zero.  These components were chosen and the staff opened them to me on the back table while the knee was lavaged copiously and the cement mixed.  The soft tissue was infiltrated with 60cc of exparel 1.3% through a 21 gauge needle.  I cemented in the components and removed all excess cement.  The polyethylene tibial component was snapped into place and the knee placed in extension while cement was hardening.  The capsule was infilltrated with a 60cc exparel/marcaine/saline mixture.   Once the cement was hard, the tourniquet was let down.  Hemostasis was obtained.  The arthrotomy was closed using a #1 stratofix running suture.  The deep soft tissues were closed with #0 vicryls and the subcuticular layer closed with #2-0 vicryl.  The skin was reapproximated and closed with 3.0 Monocryl.  The wound was covered with steristrips, aquacel dressing, and a TED stocking.   The patient was then awakened, extubated, and taken to the recovery room in stable condition.  BLOOD LOSS:  144RX COMPLICATIONS:  None.  PLAN OF CARE: Admit for overnight observation  PATIENT DISPOSITION:  PACU - hemodynamically stable.    Please fax a copy of this op note to my office at [phone] (please only include page 1 and 2 of the Case Information op note)

## 2019-06-16 NOTE — Progress Notes (Signed)
SPORTS MEDICINE AND JOINT REPLACEMENT  Lara Mulch, MD    Carlyon Shadow, PA-C [address], [place], [place]  [address]                             [phone]   PROGRESS NOTE  Subjective:  negative for Chest Pain  negative for Shortness of Breath  negative for Nausea/Vomiting   negative for Calf Pain  negative for Bowel Movement   Tolerating Diet: yes         Patient reports pain as 3 on 0-10 scale.    Objective: Vital signs in last 24 hours:    Patient Vitals for the past 24 hrs:  BP Temp Temp src Pulse Resp SpO2 Height Weight  06/16/19 1813 131/73 97.6 F (36.4 C) - 94 16 100 % - -  06/16/19 1434 139/75 98 F (36.7 C) - 85 16 100 % - -  06/16/19 1332 133/77 97.6 F (36.4 C) - 81 14 100 % - -  06/16/19 1236 133/73 (!) 97.5 F (36.4 C) - 79 16 100 % - -  06/16/19 1125 138/76 - - 80 16 100 % - -  06/16/19 1100 (!) 142/73 (!) 97.4 F (36.3 C) - 78 - 100 % - -  06/16/19 1045 139/79 - - 77 14 100 % - -  06/16/19 1030 (!) 141/71 - - - 19 - - -  06/16/19 1015 117/65 - - 81 15 100 % - -  06/16/19 1011 115/63 (!) 97 F (36.1 C) - 78 13 100 % - -  06/16/19 0753 123/71 - - 78 12 100 % - -  06/16/19 0748 124/72 - - 80 20 100 % - -  06/16/19 0743 129/67 - - 83 (!) 23 99 % - -  06/16/19 6384 - - - - - - 5\' 10"  (1.778 m) 76.2 kg  06/16/19 0628 125/63 97.9 F (36.6 C) Oral 85 17 99 % - -    @flow {1959:LAST@   Intake/Output from previous day:   No intake/output data recorded.   Intake/Output this shift:   No intake/output data recorded.   Intake/Output      07/06 0701 - 07/07 0700   P.O. 120   I.V. (mL/kg) 2439.9 (32)   IV Piggyback 100   Total Intake(mL/kg) 2659.9 (34.9)   Urine (mL/kg/hr) 380 (0.4)   Blood 50   Total Output 430   Net +2229.9          LABORATORY DATA: Recent Labs    06/11/19 0856  WBC 9.5  HGB 13.6  HCT 41.5  PLT 292   Recent Labs    06/11/19 0856  NA 140  K 3.9  CL 103  CO2 27  BUN 29*  CREATININE 1.52*  GLUCOSE 114*   CALCIUM 9.5   No results found for: INR, PROTIME  Examination:  General appearance: alert, cooperative and no distress Extremities: extremities normal, atraumatic, no cyanosis or edema  Wound Exam: clean, dry, intact   Drainage:  None: wound tissue dry  Motor Exam: Quadriceps and Hamstrings Intact  Sensory Exam: Superficial Peroneal, Deep Peroneal and Tibial normal   Assessment:    Day of Surgery  Procedure(s) (LRB): TOTAL KNEE ARTHROPLASTY (Right)  ADDITIONAL DIAGNOSIS:  Active Problems:   S/P total knee replacement     Plan: Physical Therapy as ordered Weight Bearing as Tolerated (WBAT)  DVT Prophylaxis:  Aspirin  DISCHARGE PLAN: Home  Patient doing  well, expected discharge home tomorrow once cleared by physical therapy      Patient's anticipated LOS is less than 2 midnights, meeting these requirements: - Lives within 1 hour of care - Has a competent adult at home to recover with post-op recover - NO history of  - Chronic pain requiring opiods  - Diabetes  - Coronary Artery Disease  - Heart failure  - Heart attack  - Stroke  - DVT/VTE  - Cardiac arrhythmia  - Respiratory Failure/COPD  - Renal failure  - Anemia  - Advanced Liver disease        Donia Ast 06/16/2019, 7:47 PM

## 2019-06-17 ENCOUNTER — Encounter (HOSPITAL_COMMUNITY): Payer: Self-pay | Admitting: Orthopedic Surgery

## 2019-06-17 DIAGNOSIS — Z96651 Presence of right artificial knee joint: Secondary | ICD-10-CM | POA: Diagnosis not present

## 2019-06-17 DIAGNOSIS — M1711 Unilateral primary osteoarthritis, right knee: Secondary | ICD-10-CM | POA: Diagnosis not present

## 2019-06-17 LAB — BASIC METABOLIC PANEL
Anion gap: 9 (ref 5–15)
BUN: 34 mg/dL — ABNORMAL HIGH (ref 8–23)
CO2: 22 mmol/L (ref 22–32)
Calcium: 7.7 mg/dL — ABNORMAL LOW (ref 8.9–10.3)
Chloride: 102 mmol/L (ref 98–111)
Creatinine, Ser: 1.82 mg/dL — ABNORMAL HIGH (ref 0.61–1.24)
GFR calc Af Amer: 41 mL/min — ABNORMAL LOW (ref >60)
GFR calc non Af Amer: 35 mL/min — ABNORMAL LOW (ref >60)
Glucose, Bld: 149 mg/dL — ABNORMAL HIGH (ref 70–99)
Potassium: 3.7 mmol/L (ref 3.5–5.1)
Sodium: 133 mmol/L — ABNORMAL LOW (ref 135–145)

## 2019-06-17 LAB — CBC
HCT: 34.1 % — ABNORMAL LOW (ref 39.0–52.0)
Hemoglobin: 11.3 g/dL — ABNORMAL LOW (ref 13.0–17.0)
MCH: 30.1 pg (ref 26.0–34.0)
MCHC: 33.1 g/dL (ref 30.0–36.0)
MCV: 90.9 fL (ref 80.0–100.0)
Platelets: 219 10*3/uL (ref 150–400)
RBC: 3.75 MIL/uL — ABNORMAL LOW (ref 4.22–5.81)
RDW: 12.3 % (ref 11.5–15.5)
WBC: 19.3 10*3/uL — ABNORMAL HIGH (ref 4.0–10.5)
nRBC: 0 % (ref 0.0–0.2)

## 2019-06-17 LAB — GLUCOSE, CAPILLARY
Glucose-Capillary: 126 mg/dL — ABNORMAL HIGH (ref 70–99)
Glucose-Capillary: 120 mg/dL — ABNORMAL HIGH (ref 70–99)
Glucose-Capillary: 111 mg/dL — ABNORMAL HIGH (ref 70–99)

## 2019-06-17 MED ORDER — METHOCARBAMOL 500 MG PO TABS: 500.0000 mg | ORAL_TABLET | Freq: Four times a day (QID) | ORAL | 0 refills | Status: DC | PRN

## 2019-06-17 MED ORDER — ASPIRIN 325 MG PO TBEC: 325.0000 mg | DELAYED_RELEASE_TABLET | Freq: Two times a day (BID) | ORAL | 0 refills | Status: DC

## 2019-06-17 MED ORDER — MECLIZINE HCL 25 MG PO TABS
25.0000 mg | ORAL_TABLET | Freq: Three times a day (TID) | ORAL | Status: DC | PRN
  Administered 2019-06-17 (×2): 25 mg via ORAL
  Filled 2019-06-17 (×2): qty 1

## 2019-06-17 MED ORDER — OXYCODONE HCL 5 MG PO TABS: 5.0000 mg | ORAL_TABLET | Freq: Four times a day (QID) | ORAL | 0 refills | Status: DC | PRN

## 2019-06-17 NOTE — Progress Notes (Addendum)
Patient complaining of dizziness on AM assessment. Called PA, Carlyon Shadow at 0930 and requested medication for this patient. Carlyon Shadow ordered meclizine 25mg  and stated that if patient passed PT he could still discharge this afternoon.   RN communicated concerns to PA Carlyon Shadow at [place] about continued dizziness now accompanied by nausea, and issues voiding after foley catheter removed. PA agreed that patient should stay one more night to make sure dizziness resolves, and that the patient can void independently!   Will continue to monitor patient and communicate with family.

## 2019-06-17 NOTE — Discharge Summary (Signed)
SPORTS MEDICINE & JOINT REPLACEMENT   Terrence Mulch, MD   Terrence Shadow, PA-C [place], [place], [place]  [address]                             [phone]  PATIENT ID: Terrence Cisneros        MRN:  [id number]          DOB/AGE: 1941/02/18 / 78 y.o.    DISCHARGE SUMMARY  ADMISSION DATE:    06/16/2019 DISCHARGE DATE:   06/17/2019   ADMISSION DIAGNOSIS: Primary osteoarthritis right knee    DISCHARGE DIAGNOSIS:  Primary osteoarthritis right knee    ADDITIONAL DIAGNOSIS: Active Problems:   S/P total knee replacement  Past Medical History:  Diagnosis Date  . Acquired hypothyroidism   . Arthritis   . Cervical radiculopathy    "fingers get cold and my neck pops when i turn it dide to side, theyre going to send me for a nerve induction next month"  . Chronic kidney disease    "they told me a couple years ago  to avoid taking ibuprofen because my kidneys werent 100%"  . Environmental allergies   . Hypertension   . Neuropathy    "ive got numbness in my left foot "   . Type 2 diabetes mellitus ([place])     PROCEDURE: Procedure(s): TOTAL KNEE ARTHROPLASTY on 06/16/2019  CONSULTS:    HISTORY:  See H&P in chart  HOSPITAL COURSE:  Terrence Cisneros is a 78 y.o. admitted on 06/16/2019 and found to have a diagnosis of Primary osteoarthritis right knee.  After appropriate laboratory studies were obtained  they were taken to the operating room on 06/16/2019 and underwent Procedure(s): TOTAL KNEE ARTHROPLASTY.   They were given perioperative antibiotics:  Anti-infectives (From admission, onward)   Start     Dose/Rate Route Frequency Ordered Stop   06/16/19 1400  ceFAZolin (ANCEF) IVPB 2g/100 mL premix     2 g 200 mL/hr over 30 Minutes Intravenous Every 6 hours 06/16/19 1145 06/16/19 2130   06/16/19 0630  ceFAZolin (ANCEF) IVPB 2g/100 mL premix     2 g 200 mL/hr over 30 Minutes Intravenous On call to O.R. 06/16/19 5852 06/16/19 0815    .  Patient given tranexamic acid IV or topical  and exparel intra-operatively.  Tolerated the procedure well.    POD# 1: Vital signs were stable.  Patient denied Chest pain, shortness of breath, or calf pain.  Patient was started on Aspirin twice daily at 8am.  Consults to PT, OT, and care management were made.  The patient was weight bearing as tolerated.  CPM was placed on the operative leg 0-90 degrees for 6-8 hours a day. When out of the CPM, patient was placed in the foam block to achieve full extension. Incentive spirometry was taught.  Dressing was changed.       POD #2, Continued  PT for ambulation and exercise program.  IV saline locked.  O2 discontinued.    The remainder of the hospital course was dedicated to ambulation and strengthening.   The patient was discharged on 1 Day Post-Op in  Good condition.  Blood products given:none  DIAGNOSTIC STUDIES: Recent vital signs:  Patient Vitals for the past 24 hrs:  BP Temp Temp src Pulse Resp SpO2  06/17/19 0442 108/74 (!) 97.5 F (36.4 C) Oral 82 20 100 %  06/17/19 0145 112/77 98 F (36.7 C) Oral 87  16 97 %  06/16/19 2154 130/79 (!) 97.5 F (36.4 C) Oral 89 16 96 %  06/16/19 1813 131/73 97.6 F (36.4 C) - 94 16 100 %  06/16/19 1434 139/75 98 F (36.7 C) - 85 16 100 %  06/16/19 1332 133/77 97.6 F (36.4 C) - 81 14 100 %  06/16/19 1236 133/73 (!) 97.5 F (36.4 C) - 79 16 100 %  06/16/19 1125 138/76 - - 80 16 100 %  06/16/19 1100 (!) 142/73 (!) 97.4 F (36.3 C) - 78 - 100 %  06/16/19 1045 139/79 - - 77 14 100 %  06/16/19 1030 (!) 141/71 - - - 19 -  06/16/19 1015 117/65 - - 81 15 100 %  06/16/19 1011 115/63 (!) 97 F (36.1 C) - 78 13 100 %       Recent laboratory studies: Recent Labs    06/11/19 0856 06/17/19 0320  WBC 9.5 19.3*  HGB 13.6 11.3*  HCT 41.5 34.1*  PLT 292 219   Recent Labs    06/11/19 0856 06/17/19 0320  NA 140 133*  K 3.9 3.7  CL 103 102  CO2 27 22  BUN 29* 34*  CREATININE 1.52* 1.82*  GLUCOSE 114* 149*  CALCIUM 9.5 7.7*   No results  found for: INR, PROTIME   Recent Radiographic Studies :  No results found.  DISCHARGE INSTRUCTIONS: Discharge Instructions    Call MD / Call 911   Complete by: As directed    If you experience chest pain or shortness of breath, CALL 911 and be transported to the hospital emergency room.  If you develope a fever above 101 F, pus (white drainage) or increased drainage or redness at the wound, or calf pain, call your surgeon's office.   Constipation Prevention   Complete by: As directed    Drink plenty of fluids.  Prune juice may be helpful.  You may use a stool softener, such as Colace (over the counter) 100 mg twice a day.  Use MiraLax (over the counter) for constipation as needed.   Diet - low sodium heart healthy   Complete by: As directed    Discharge instructions   Complete by: As directed    INSTRUCTIONS AFTER JOINT REPLACEMENT   Remove items at home which could result in a fall. This includes throw rugs or furniture in walking pathways ICE to the affected joint every three hours while awake for 30 minutes at a time, for at least the first 3-5 days, and then as needed for pain and swelling.  Continue to use ice for pain and swelling. You may notice swelling that will progress down to the foot and ankle.  This is normal after surgery.  Elevate your leg when you are not up walking on it.   Continue to use the breathing machine you got in the hospital (incentive spirometer) which will help keep your temperature down.  It is common for your temperature to cycle up and down following surgery, especially at night when you are not up moving around and exerting yourself.  The breathing machine keeps your lungs expanded and your temperature down.   DIET:  As you were doing prior to hospitalization, we recommend a well-balanced diet.  DRESSING / WOUND CARE / SHOWERING  Keep the surgical dressing until follow up.  The dressing is water proof, so you can shower without any extra covering.  IF THE  DRESSING FALLS OFF or the wound gets wet inside, change the dressing  with sterile gauze.  Please use good hand washing techniques before changing the dressing.  Do not use any lotions or creams on the incision until instructed by your surgeon.    ACTIVITY  Increase activity slowly as tolerated, but follow the weight bearing instructions below.   No driving for 6 weeks or until further direction given by your physician.  You cannot drive while taking narcotics.  No lifting or carrying greater than 10 lbs. until further directed by your surgeon. Avoid periods of inactivity such as sitting longer than an hour when not asleep. This helps prevent blood clots.  You may return to work once you are authorized by your doctor.     WEIGHT BEARING   Weight bearing as tolerated with assist device (walker, cane, etc) as directed, use it as long as suggested by your surgeon or therapist, typically at least 4-6 weeks.   EXERCISES  Results after joint replacement surgery are often greatly improved when you follow the exercise, range of motion and muscle strengthening exercises prescribed by your doctor. Safety measures are also important to protect the joint from further injury. Any time any of these exercises cause you to have increased pain or swelling, decrease what you are doing until you are comfortable again and then slowly increase them. If you have problems or questions, call your caregiver or physical therapist for advice.   Rehabilitation is important following a joint replacement. After just a few days of immobilization, the muscles of the leg can become weakened and shrink (atrophy).  These exercises are designed to build up the tone and strength of the thigh and leg muscles and to improve motion. Often times heat used for twenty to thirty minutes before working out will loosen up your tissues and help with improving the range of motion but do not use heat for the first two weeks following surgery  (sometimes heat can increase post-operative swelling).   These exercises can be done on a training (exercise) mat, on the floor, on a table or on a bed. Use whatever works the best and is most comfortable for you.    Use music or television while you are exercising so that the exercises are a pleasant break in your day. This will make your life better with the exercises acting as a break in your routine that you can look forward to.   Perform all exercises about fifteen times, three times per day or as directed.  You should exercise both the operative leg and the other leg as well.   Exercises include:   Quad Sets - Tighten up the muscle on the front of the thigh (Quad) and hold for 5-10 seconds.   Straight Leg Raises - With your knee straight (if you were given a brace, keep it on), lift the leg to 60 degrees, hold for 3 seconds, and slowly lower the leg.  Perform this exercise against resistance later as your leg gets stronger.  Leg Slides: Lying on your back, slowly slide your foot toward your buttocks, bending your knee up off the floor (only go as far as is comfortable). Then slowly slide your foot back down until your leg is flat on the floor again.  Angel Wings: Lying on your back spread your legs to the side as far apart as you can without causing discomfort.  Hamstring Strength:  Lying on your back, push your heel against the floor with your leg straight by tightening up the muscles of your buttocks.  Repeat,  but this time bend your knee to a comfortable angle, and push your heel against the floor.  You may put a pillow under the heel to make it more comfortable if necessary.   A rehabilitation program following joint replacement surgery can speed recovery and prevent re-injury in the future due to weakened muscles. Contact your doctor or a physical therapist for more information on knee rehabilitation.    CONSTIPATION  Constipation is defined medically as fewer than three stools per week  and severe constipation as less than one stool per week.  Even if you have a regular bowel pattern at home, your normal regimen is likely to be disrupted due to multiple reasons following surgery.  Combination of anesthesia, postoperative narcotics, change in appetite and fluid intake all can affect your bowels.   YOU MUST use at least one of the following options; they are listed in order of increasing strength to get the job done.  They are all available over the counter, and you may need to use some, POSSIBLY even all of these options:    Drink plenty of fluids (prune juice may be helpful) and high fiber foods Colace 100 mg by mouth twice a day  Senokot for constipation as directed and as needed Dulcolax (bisacodyl), take with full glass of water  Miralax (polyethylene glycol) once or twice a day as needed.  If you have tried all these things and are unable to have a bowel movement in the first 3-4 days after surgery call either your surgeon or your primary doctor.    If you experience loose stools or diarrhea, hold the medications until you stool forms back up.  If your symptoms do not get better within 1 week or if they get worse, check with your doctor.  If you experience "the worst abdominal pain ever" or develop nausea or vomiting, please contact the office immediately for further recommendations for treatment.   ITCHING:  If you experience itching with your medications, try taking only a single pain pill, or even half a pain pill at a time.  You can also use Benadryl over the counter for itching or also to help with sleep.   TED HOSE STOCKINGS:  Use stockings on both legs until for at least 2 weeks or as directed by physician office. They may be removed at night for sleeping.  MEDICATIONS:  See your medication summary on the "After Visit Summary" that nursing will review with you.  You may have some home medications which will be placed on hold until you complete the course of blood  thinner medication.  It is important for you to complete the blood thinner medication as prescribed.  PRECAUTIONS:  If you experience chest pain or shortness of breath - call 911 immediately for transfer to the hospital emergency department.   If you develop a fever greater that 101 F, purulent drainage from wound, increased redness or drainage from wound, foul odor from the wound/dressing, or calf pain - CONTACT YOUR SURGEON.                                                   FOLLOW-UP APPOINTMENTS:  If you do not already have a post-op appointment, please call the office for an appointment to be seen by your surgeon.  Guidelines for how soon to be seen are listed  in your "After Visit Summary", but are typically between 1-4 weeks after surgery.  OTHER INSTRUCTIONS:   Knee Replacement:  Do not place pillow under knee, focus on keeping the knee straight while resting. CPM instructions: 0-90 degrees, 2 hours in the morning, 2 hours in the afternoon, and 2 hours in the evening. Place foam block, curve side up under heel at all times except when in CPM or when walking.  DO NOT modify, tear, cut, or change the foam block in any way.  MAKE SURE YOU:  Understand these instructions.  Get help right away if you are not doing well or get worse.    Thank you for letting us be a part of your medical care team.  It is a privilege we respect greatly.  We hope these instructions will help you stay on track for a fast and full recovery!   Increase activity slowly as tolerated   Complete by: As directed       DISCHARGE MEDICATIONS:   Allergies as of 06/17/2019      Reactions   Lisinopril Other (See Comments)   BP dropped very low      Medication List    TAKE these medications   acetaminophen 650 MG CR tablet Commonly known as: TYLENOL Take 1,300 mg by mouth every 8 (eight) hours as needed for pain.   aspirin 325 MG EC tablet Take 1 tablet (325 mg total) by mouth 2 (two) times daily. What changed:    medication strength  how much to take  when to take this   losartan-hydrochlorothiazide 100-25 MG tablet Commonly known as: HYZAAR Take 0.5 tablets by mouth daily.   metFORMIN 500 MG tablet Commonly known as: GLUCOPHAGE Take 500 mg by mouth 2 (two) times daily with a meal.   methocarbamol 500 MG tablet Commonly known as: ROBAXIN Take 1-2 tablets (500-1,000 mg total) by mouth every 6 (six) hours as needed for muscle spasms.   oxyCODONE 5 MG immediate release tablet Commonly known as: Oxy IR/ROXICODONE Take 1-2 tablets (5-10 mg total) by mouth every 6 (six) hours as needed for moderate pain (pain score 4-6).            Durable Medical Equipment  (From admission, onward)         Start     Ordered   06/16/19 1146  DME Walker rolling  Once    Question:  Patient needs a walker to treat with the following condition  Answer:  S/P total knee replacement   06/16/19 1145   06/16/19 1146  DME 3 n 1  Once     06/16/19 1145   06/16/19 1146  DME Bedside commode  Once    Question:  Patient needs a bedside commode to treat with the following condition  Answer:  S/P total knee replacement   06/16/19 1145          FOLLOW UP VISIT:    DISPOSITION: HOME VS. SNF  CONDITION:  Good   Donia Ast 06/17/2019, 7:56 AM

## 2019-06-17 NOTE — Progress Notes (Signed)
Physical Therapy Treatment Patient Details Name: Terrence Cisneros MRN: [id number] DOB: 1941/01/08 Today's Date: 06/17/2019    History of Present Illness 78 yo male s/p R TKR on 06/16/19. PMH includes DMII, carpal tunnel, cervical radiculopathy, OA, CKD, HTN, L foot neuropathy.    PT Comments    POD # 1 am session Pt OOB in recliner.  RN reported pt developed dizziness when getting OOB earlier.  Assisted with amb a functional distance.  Slight "woozyness".  BP 126/76 after amb 3 feet.  Recliner following for safety.  Then returned to room to perform some TE's following HEP handout.  Instructed on proper tech, freq as well as use of ICE.     Follow Up Recommendations  Follow surgeon's recommendation for DC plan and follow-up therapies;Supervision for mobility/OOB     Equipment Recommendations  None recommended by PT    Recommendations for Other Services       Precautions / Restrictions Precautions Precautions: Fall Restrictions Weight Bearing Restrictions: No Other Position/Activity Restrictions: WBAT    Mobility  Bed Mobility               General bed mobility comments: OOB in recliner  Transfers Overall transfer level: Needs assistance Equipment used: Rolling walker (2 wheeled) Transfers: Sit to/from Stand Sit to Stand: Min guard;From elevated surface         General transfer comment: Min guard for safety, verbal cuing for hand placement when rising.  Safety with turns  Ambulation/Gait Ambulation/Gait assistance: Min guard Gait Distance (Feet): 75 Feet   Gait Pattern/deviations: Step-to pattern;Step-through pattern;Trunk flexed Gait velocity: decr   General Gait Details: Min guard for safety, verbal cuing for upright posture, sequencing, turning with RW.   Stairs             Wheelchair Mobility    Modified Rankin (Stroke Patients Only)       Balance                                            Cognition Arousal/Alertness:  Awake/alert Behavior During Therapy: WFL for tasks assessed/performed Overall Cognitive Status: Within Functional Limits for tasks assessed                                        Exercises      General Comments        Pertinent Vitals/Pain Pain Assessment: 0-10 Pain Score: 3  Pain Location: R knee Pain Descriptors / Indicators: Sore Pain Intervention(s): Monitored during session;Premedicated before session;Repositioned;Ice applied    Home Living                      Prior Function            PT Goals (current goals can now be found in the care plan section) Progress towards PT goals: Progressing toward goals    Frequency    7X/week      PT Plan Current plan remains appropriate    Co-evaluation              AM-PAC PT "6 Clicks" Mobility   Outcome Measure  Help needed turning from your back to your side while in a flat bed without using bedrails?: A Little Help needed moving from lying on your back  to sitting on the side of a flat bed without using bedrails?: A Little Help needed moving to and from a bed to a chair (including a wheelchair)?: A Little Help needed standing up from a chair using your arms (e.g., wheelchair or bedside chair)?: A Little Help needed to walk in hospital room?: A Little Help needed climbing 3-5 steps with a railing? : A Little 6 Click Score: 18    End of Session Equipment Utilized During Treatment: Gait belt Activity Tolerance: Patient tolerated treatment well Patient left: in chair;with call bell/phone within reach;with chair alarm set;with SCD's reapplied Nurse Communication: Mobility status PT Visit Diagnosis: Difficulty in walking, not elsewhere classified (R26.2);Other abnormalities of gait and mobility (R26.89)     Time: 1110-1135 PT Time Calculation (min) (ACUTE ONLY): 25 min  Charges:  $Gait Training: 8-22 mins $Therapeutic Activity: 8-22 mins                     Rica Koyanagi  PTA Acute   Rehabilitation Services Pager      [phone] Office      [phone]

## 2019-06-17 NOTE — TOC Transition Note (Signed)
Transition of Care [facility]) - CM/SW Discharge Note   Patient Details  Name: Terrence Cisneros MRN: [id number] Date of Birth: 1941-05-01  [facility] CM/SW Contact:  Leeroy Cha, RN Phone Number: 06/17/2019, 10:38 AM   Clinical Narrative:    dcd to home with [facility]hc through [facility],  Has needed equipment.   Final next level of care: [place] Barriers to Discharge: No Barriers Identified   Patient Goals and CMS Choice Patient states their goals for this hospitalization and ongoing recovery are:: to go home and get better CMS Medicare.gov Compare Post Acute Care list provided to:: Patient Choice offered to / list presented to : Patient  Discharge Placement                       Discharge Plan and Services   Discharge Planning Services: CM Consult Post Acute Care Choice: Durable Medical Equipment, Home Health          DME Arranged: Walker rolling, 3-N-1 DME Agency: [employer] Date DME Agency Contacted: 06/17/19 Time DME Agency Contacted: [phone] Representative spoke with at DME Agency: [place]: PT [place]: [facility]formerly [employer] Date [place]: 06/17/19 Time [place]: [phone]    Social Determinants of Health ([place]) Interventions     Readmission Risk Interventions No flowsheet data found.

## 2019-06-17 NOTE — Progress Notes (Signed)
Physical Therapy Treatment Patient Details Name: Terrence Cisneros MRN: [id number] DOB: 1941-05-16 Today's Date: 06/17/2019    History of Present Illness 78 yo male s/p R TKR on 06/16/19. PMH includes DMII, carpal tunnel, cervical radiculopathy, OA, CKD, HTN, L foot neuropathy.    PT Comments    Pod # 1 PM session. Assisted with amb in hallway, practiced stairs Then returned to room to perform some TE's following HEP handout.  Instructed on proper tech, freq as well as use of ICE.   Assisted to bathroom.  Pt has been unable to void since cath was D/C'd.   Addressed all mobility questions, discussed appropriate activity, educated on use of ICE.  Pt ready for D/C to home.   Follow Up Recommendations  Follow surgeon's recommendation for DC plan and follow-up therapies;Supervision for mobility/OOB     Equipment Recommendations  None recommended by PT    Recommendations for Other Services       Precautions / Restrictions Precautions Precautions: Fall Restrictions Weight Bearing Restrictions: No Other Position/Activity Restrictions: WBAT    Mobility  Bed Mobility               General bed mobility comments: OOB in recliner  Transfers Overall transfer level: Needs assistance Equipment used: Rolling walker (2 wheeled) Transfers: Sit to/from Stand Sit to Stand: Min guard;From elevated surface         General transfer comment: Min guard for safety, verbal cuing for hand placement when rising.  Safety with turns  Ambulation/Gait Ambulation/Gait assistance: Min guard Gait Distance (Feet): 55 Feet   Gait Pattern/deviations: Step-to pattern;Step-through pattern;Trunk flexed Gait velocity: decr   General Gait Details: Min guard for safety, verbal cuing for upright posture, sequencing, turning with RW.   Stairs Stairs: Yes Stairs assistance: Supervision;Min guard Stair Management: Two rails;Step to pattern;Forwards Number of Stairs: 3 General stair comments: 25% VC's  on proper sequencing and safety.  performed twice.   Wheelchair Mobility    Modified Rankin (Stroke Patients Only)       Balance                                            Cognition Arousal/Alertness: Awake/alert Behavior During Therapy: WFL for tasks assessed/performed Overall Cognitive Status: Within Functional Limits for tasks assessed                                        Exercises   Total Knee Replacement TE's 10 reps B LE ankle pumps 10 reps towel squeezes 10 reps knee presses 10 reps heel slides   Followed by ICE     General Comments        Pertinent Vitals/Pain Pain Assessment: 0-10 Pain Score: 3  Pain Location: R knee Pain Descriptors / Indicators: Sore Pain Intervention(s): Monitored during session;Premedicated before session;Repositioned;Ice applied    Home Living                      Prior Function            PT Goals (current goals can now be found in the care plan section) Progress towards PT goals: Progressing toward goals    Frequency    7X/week      PT Plan Current plan remains appropriate  Co-evaluation              AM-PAC PT "6 Clicks" Mobility   Outcome Measure  Help needed turning from your back to your side while in a flat bed without using bedrails?: A Little Help needed moving from lying on your back to sitting on the side of a flat bed without using bedrails?: A Little Help needed moving to and from a bed to a chair (including a wheelchair)?: A Little Help needed standing up from a chair using your arms (e.g., wheelchair or bedside chair)?: A Little Help needed to walk in hospital room?: A Little Help needed climbing 3-5 steps with a railing? : A Little 6 Click Score: 18    End of Session Equipment Utilized During Treatment: Gait belt Activity Tolerance: Patient tolerated treatment well Patient left: in chair;with call bell/phone within reach;with chair alarm  set;with SCD's reapplied Nurse Communication: Mobility status PT Visit Diagnosis: Difficulty in walking, not elsewhere classified (R26.2);Other abnormalities of gait and mobility (R26.89)     Time: 4782-9562 PT Time Calculation (min) (ACUTE ONLY): 25 min  Charges:  $Gait Training: 8-22 mins $Therapeutic Exercise: 8-22 mins                      Rica Koyanagi  PTA Acute  Rehabilitation Services Pager      [phone] Office      [phone]

## 2019-06-18 DIAGNOSIS — M1711 Unilateral primary osteoarthritis, right knee: Secondary | ICD-10-CM | POA: Diagnosis not present

## 2019-06-18 LAB — CBC
HCT: 36.9 % — ABNORMAL LOW (ref 39.0–52.0)
Hemoglobin: 12.2 g/dL — ABNORMAL LOW (ref 13.0–17.0)
MCH: 30.3 pg (ref 26.0–34.0)
MCHC: 33.1 g/dL (ref 30.0–36.0)
MCV: 91.8 fL (ref 80.0–100.0)
Platelets: 173 10*3/uL (ref 150–400)
RBC: 4.02 MIL/uL — ABNORMAL LOW (ref 4.22–5.81)
RDW: 12.6 % (ref 11.5–15.5)
WBC: 14.2 10*3/uL — ABNORMAL HIGH (ref 4.0–10.5)
nRBC: 0 % (ref 0.0–0.2)

## 2019-06-18 NOTE — Progress Notes (Signed)
Physical Therapy Treatment Patient Details Name: Terrence Cisneros MRN: [id number] DOB: Aug 28, 1941 Today's Date: 06/18/2019    History of Present Illness 78 yo male s/p R TKR on 06/16/19. PMH includes DMII, carpal tunnel, cervical radiculopathy, OA, CKD, HTN, L foot neuropathy.    PT Comments    POD # 2 am session Pt stayed an extra night due to inability to void.  Assisted with amb in hallway, practiced stairs then Then returned to room to perform some TE's following HEP handout.  Instructed on proper tech, freq as well as use of ICE.  Addressed all mobility questions, discussed appropriate activity, educated on use of ICE.  Pt ready for D/C to home.    Follow Up Recommendations   [facility]     Equipment Recommendations       Recommendations for Other Services       Precautions / Restrictions Precautions Precautions: Fall Restrictions Weight Bearing Restrictions: No Other Position/Activity Restrictions: WBAT    Mobility  Bed Mobility               General bed mobility comments: OOB in recliner  Transfers Overall transfer level: Needs assistance Equipment used: Rolling walker (2 wheeled) Transfers: Sit to/from Stand Sit to Stand: Supervision         General transfer comment: one VC safety with tight turns in bathroom  Ambulation/Gait Ambulation/Gait assistance: Supervision;Min guard Gait Distance (Feet): 85 Feet Assistive device: Rolling walker (2 wheeled) Gait Pattern/deviations: Step-to pattern;Step-through pattern;Trunk flexed Gait velocity: decreased   General Gait Details: Supervision  for safety, verbal cuing for upright posture, sequencing, turning with RW.   Stairs   Stairs assistance: Supervision Stair Management: Two rails;Step to pattern;Forwards Number of Stairs: 3 General stair comments: no VC's pt able to recall proper sequencing   Wheelchair Mobility    Modified Rankin (Stroke Patients Only)       Balance                                             Cognition Arousal/Alertness: Awake/alert Behavior During Therapy: WFL for tasks assessed/performed Overall Cognitive Status: Within Functional Limits for tasks assessed                                 General Comments: pt feeling better today      Exercises      General Comments        Pertinent Vitals/Pain Pain Assessment: 0-10 Pain Score: 2  Pain Location: R knee Pain Descriptors / Indicators: Tender;Operative site guarding Pain Intervention(s): Monitored during session;Premedicated before session;Repositioned    Home Living                      Prior Function            PT Goals (current goals can now be found in the care plan section) Progress towards PT goals: Progressing toward goals    Frequency           PT Plan      Co-evaluation              AM-PAC PT "6 Clicks" Mobility   Outcome Measure                   End of Session Equipment Utilized During  Treatment: Gait belt Activity Tolerance: Patient tolerated treatment well Patient left: in chair;with call bell/phone within reach;with chair alarm set;with SCD's reapplied Nurse Communication: Mobility status       Time: 1137-1202 PT Time Calculation (min) (ACUTE ONLY): 25 min  Charges:  $Gait Training: 8-22 mins $Therapeutic Exercise: 8-22 mins                     Rica Koyanagi  PTA Acute  Rehabilitation Services Pager      [phone] Office      [phone]

## 2019-06-18 NOTE — Discharge Summary (Signed)
SPORTS MEDICINE & JOINT REPLACEMENT   Lara Mulch, MD   Carlyon Shadow, PA-C [place], [place], [place]  [address]                             [phone]  PATIENT ID: Terrence Cisneros        MRN:  [id number]          DOB/AGE: [birth year] / 78 y.o.    DISCHARGE SUMMARY  ADMISSION DATE:    06/16/2019 DISCHARGE DATE:   06/18/2019   ADMISSION DIAGNOSIS: Primary osteoarthritis right knee    DISCHARGE DIAGNOSIS:  Primary osteoarthritis right knee    ADDITIONAL DIAGNOSIS: Active Problems:   S/P total knee replacement  Past Medical History:  Diagnosis Date  . Acquired hypothyroidism   . Arthritis   . Cervical radiculopathy    "fingers get cold and my neck pops when i turn it dide to side, theyre going to send me for a nerve induction next month"  . Chronic kidney disease    "they told me a couple years ago  to avoid taking ibuprofen because my kidneys werent 100%"  . Environmental allergies   . Hypertension   . Neuropathy    "ive got numbness in my left foot "   . Type 2 diabetes mellitus ([place])     PROCEDURE: Procedure(s): TOTAL KNEE ARTHROPLASTY on 06/16/2019  CONSULTS:    HISTORY:  See H&P in chart  HOSPITAL COURSE:  VICKEY EWBANK is a 78 y.o. admitted on 06/16/2019 and found to have a diagnosis of Primary osteoarthritis right knee.  After appropriate laboratory studies were obtained  they were taken to the operating room on 06/16/2019 and underwent Procedure(s): TOTAL KNEE ARTHROPLASTY.   They were given perioperative antibiotics:  Anti-infectives (From admission, onward)   Start     Dose/Rate Route Frequency Ordered Stop   06/16/19 1400  ceFAZolin (ANCEF) IVPB 2g/100 mL premix     2 g 200 mL/hr over 30 Minutes Intravenous Every 6 hours 06/16/19 1145 06/16/19 2130   06/16/19 0630  ceFAZolin (ANCEF) IVPB 2g/100 mL premix     2 g 200 mL/hr over 30 Minutes Intravenous On call to O.R. 06/16/19 3976 06/16/19 0815    .  Patient given tranexamic acid IV or topical  and exparel intra-operatively.  Tolerated the procedure well.    POD# 1: Vital signs were stable.  Patient denied Chest pain, shortness of breath, or calf pain.  Patient was started on Aspirin twice daily at 8am.  Consults to PT, OT, and care management were made.  The patient was weight bearing as tolerated.  CPM was placed on the operative leg 0-90 degrees for 6-8 hours a day. When out of the CPM, patient was placed in the foam block to achieve full extension. Incentive spirometry was taught.  Dressing was changed.       POD #2, Continued  PT for ambulation and exercise program.  IV saline locked.  O2 discontinued.    The remainder of the hospital course was dedicated to ambulation and strengthening.   The patient was discharged on 2 Days Post-Op in  Good condition.  Blood products given:none  DIAGNOSTIC STUDIES: Recent vital signs:  Patient Vitals for the past 24 hrs:  BP Temp Temp src Pulse Resp SpO2  06/18/19 0542 135/84 97.6 F (36.4 C) Oral 86 18 100 %  06/17/19 2032 (!) 151/78 97.8 F (36.6 C) Oral 78  18 95 %  06/17/19 1409 132/66 97.6 F (36.4 C) Oral 68 15 99 %       Recent laboratory studies: Recent Labs    06/17/19 0320 06/18/19 0340  WBC 19.3* 14.2*  HGB 11.3* 12.2*  HCT 34.1* 36.9*  PLT 219 173   Recent Labs    06/17/19 0320  NA 133*  K 3.7  CL 102  CO2 22  BUN 34*  CREATININE 1.82*  GLUCOSE 149*  CALCIUM 7.7*   No results found for: INR, PROTIME   Recent Radiographic Studies :  No results found.  DISCHARGE INSTRUCTIONS: Discharge Instructions    Call MD / Call 911   Complete by: As directed    If you experience chest pain or shortness of breath, CALL 911 and be transported to the hospital emergency room.  If you develope a fever above 101 F, pus (white drainage) or increased drainage or redness at the wound, or calf pain, call your surgeon's office.   Call MD / Call 911   Complete by: As directed    If you experience chest pain or shortness  of breath, CALL 911 and be transported to the hospital emergency room.  If you develope a fever above 101 F, pus (white drainage) or increased drainage or redness at the wound, or calf pain, call your surgeon's office.   Constipation Prevention   Complete by: As directed    Drink plenty of fluids.  Prune juice may be helpful.  You may use a stool softener, such as Colace (over the counter) 100 mg twice a day.  Use MiraLax (over the counter) for constipation as needed.   Constipation Prevention   Complete by: As directed    Drink plenty of fluids.  Prune juice may be helpful.  You may use a stool softener, such as Colace (over the counter) 100 mg twice a day.  Use MiraLax (over the counter) for constipation as needed.   Diet - low sodium heart healthy   Complete by: As directed    Diet - low sodium heart healthy   Complete by: As directed    Discharge instructions   Complete by: As directed    INSTRUCTIONS AFTER JOINT REPLACEMENT   Remove items at home which could result in a fall. This includes throw rugs or furniture in walking pathways ICE to the affected joint every three hours while awake for 30 minutes at a time, for at least the first 3-5 days, and then as needed for pain and swelling.  Continue to use ice for pain and swelling. You may notice swelling that will progress down to the foot and ankle.  This is normal after surgery.  Elevate your leg when you are not up walking on it.   Continue to use the breathing machine you got in the hospital (incentive spirometer) which will help keep your temperature down.  It is common for your temperature to cycle up and down following surgery, especially at night when you are not up moving around and exerting yourself.  The breathing machine keeps your lungs expanded and your temperature down.   DIET:  As you were doing prior to hospitalization, we recommend a well-balanced diet.  DRESSING / WOUND CARE / SHOWERING  Keep the surgical dressing until  follow up.  The dressing is water proof, so you can shower without any extra covering.  IF THE DRESSING FALLS OFF or the wound gets wet inside, change the dressing with sterile gauze.  Please  use good hand washing techniques before changing the dressing.  Do not use any lotions or creams on the incision until instructed by your surgeon.    ACTIVITY  Increase activity slowly as tolerated, but follow the weight bearing instructions below.   No driving for 6 weeks or until further direction given by your physician.  You cannot drive while taking narcotics.  No lifting or carrying greater than 10 lbs. until further directed by your surgeon. Avoid periods of inactivity such as sitting longer than an hour when not asleep. This helps prevent blood clots.  You may return to work once you are authorized by your doctor.     WEIGHT BEARING   Weight bearing as tolerated with assist device (walker, cane, etc) as directed, use it as long as suggested by your surgeon or therapist, typically at least 4-6 weeks.   EXERCISES  Results after joint replacement surgery are often greatly improved when you follow the exercise, range of motion and muscle strengthening exercises prescribed by your doctor. Safety measures are also important to protect the joint from further injury. Any time any of these exercises cause you to have increased pain or swelling, decrease what you are doing until you are comfortable again and then slowly increase them. If you have problems or questions, call your caregiver or physical therapist for advice.   Rehabilitation is important following a joint replacement. After just a few days of immobilization, the muscles of the leg can become weakened and shrink (atrophy).  These exercises are designed to build up the tone and strength of the thigh and leg muscles and to improve motion. Often times heat used for twenty to thirty minutes before working out will loosen up your tissues and help with  improving the range of motion but do not use heat for the first two weeks following surgery (sometimes heat can increase post-operative swelling).   These exercises can be done on a training (exercise) mat, on the floor, on a table or on a bed. Use whatever works the best and is most comfortable for you.    Use music or television while you are exercising so that the exercises are a pleasant break in your day. This will make your life better with the exercises acting as a break in your routine that you can look forward to.   Perform all exercises about fifteen times, three times per day or as directed.  You should exercise both the operative leg and the other leg as well.   Exercises include:   Quad Sets - Tighten up the muscle on the front of the thigh (Quad) and hold for 5-10 seconds.   Straight Leg Raises - With your knee straight (if you were given a brace, keep it on), lift the leg to 60 degrees, hold for 3 seconds, and slowly lower the leg.  Perform this exercise against resistance later as your leg gets stronger.  Leg Slides: Lying on your back, slowly slide your foot toward your buttocks, bending your knee up off the floor (only go as far as is comfortable). Then slowly slide your foot back down until your leg is flat on the floor again.  Angel Wings: Lying on your back spread your legs to the side as far apart as you can without causing discomfort.  Hamstring Strength:  Lying on your back, push your heel against the floor with your leg straight by tightening up the muscles of your buttocks.  Repeat, but this time bend your  knee to a comfortable angle, and push your heel against the floor.  You may put a pillow under the heel to make it more comfortable if necessary.   A rehabilitation program following joint replacement surgery can speed recovery and prevent re-injury in the future due to weakened muscles. Contact your doctor or a physical therapist for more information on knee rehabilitation.     CONSTIPATION  Constipation is defined medically as fewer than three stools per week and severe constipation as less than one stool per week.  Even if you have a regular bowel pattern at home, your normal regimen is likely to be disrupted due to multiple reasons following surgery.  Combination of anesthesia, postoperative narcotics, change in appetite and fluid intake all can affect your bowels.   YOU MUST use at least one of the following options; they are listed in order of increasing strength to get the job done.  They are all available over the counter, and you may need to use some, POSSIBLY even all of these options:    Drink plenty of fluids (prune juice may be helpful) and high fiber foods Colace 100 mg by mouth twice a day  Senokot for constipation as directed and as needed Dulcolax (bisacodyl), take with full glass of water  Miralax (polyethylene glycol) once or twice a day as needed.  If you have tried all these things and are unable to have a bowel movement in the first 3-4 days after surgery call either your surgeon or your primary doctor.    If you experience loose stools or diarrhea, hold the medications until you stool forms back up.  If your symptoms do not get better within 1 week or if they get worse, check with your doctor.  If you experience "the worst abdominal pain ever" or develop nausea or vomiting, please contact the office immediately for further recommendations for treatment.   ITCHING:  If you experience itching with your medications, try taking only a single pain pill, or even half a pain pill at a time.  You can also use Benadryl over the counter for itching or also to help with sleep.   TED HOSE STOCKINGS:  Use stockings on both legs until for at least 2 weeks or as directed by physician office. They may be removed at night for sleeping.  MEDICATIONS:  See your medication summary on the "After Visit Summary" that nursing will review with you.  You may have some  home medications which will be placed on hold until you complete the course of blood thinner medication.  It is important for you to complete the blood thinner medication as prescribed.  PRECAUTIONS:  If you experience chest pain or shortness of breath - call 911 immediately for transfer to the hospital emergency department.   If you develop a fever greater that 101 F, purulent drainage from wound, increased redness or drainage from wound, foul odor from the wound/dressing, or calf pain - CONTACT YOUR SURGEON.                                                   FOLLOW-UP APPOINTMENTS:  If you do not already have a post-op appointment, please call the office for an appointment to be seen by your surgeon.  Guidelines for how soon to be seen are listed in your "After Visit Summary",  but are typically between 1-4 weeks after surgery.  OTHER INSTRUCTIONS:   Knee Replacement:  Do not place pillow under knee, focus on keeping the knee straight while resting. CPM instructions: 0-90 degrees, 2 hours in the morning, 2 hours in the afternoon, and 2 hours in the evening. Place foam block, curve side up under heel at all times except when in CPM or when walking.  DO NOT modify, tear, cut, or change the foam block in any way.  MAKE SURE YOU:  Understand these instructions.  Get help right away if you are not doing well or get worse.    Thank you for letting us be a part of your medical care team.  It is a privilege we respect greatly.  We hope these instructions will help you stay on track for a fast and full recovery!   Discharge instructions   Complete by: As directed    INSTRUCTIONS AFTER JOINT REPLACEMENT   Remove items at home which could result in a fall. This includes throw rugs or furniture in walking pathways ICE to the affected joint every three hours while awake for 30 minutes at a time, for at least the first 3-5 days, and then as needed for pain and swelling.  Continue to use ice for pain and  swelling. You may notice swelling that will progress down to the foot and ankle.  This is normal after surgery.  Elevate your leg when you are not up walking on it.   Continue to use the breathing machine you got in the hospital (incentive spirometer) which will help keep your temperature down.  It is common for your temperature to cycle up and down following surgery, especially at night when you are not up moving around and exerting yourself.  The breathing machine keeps your lungs expanded and your temperature down.   DIET:  As you were doing prior to hospitalization, we recommend a well-balanced diet.  DRESSING / WOUND CARE / SHOWERING  Keep the surgical dressing until follow up.  The dressing is water proof, so you can shower without any extra covering.  IF THE DRESSING FALLS OFF or the wound gets wet inside, change the dressing with sterile gauze.  Please use good hand washing techniques before changing the dressing.  Do not use any lotions or creams on the incision until instructed by your surgeon.    ACTIVITY  Increase activity slowly as tolerated, but follow the weight bearing instructions below.   No driving for 6 weeks or until further direction given by your physician.  You cannot drive while taking narcotics.  No lifting or carrying greater than 10 lbs. until further directed by your surgeon. Avoid periods of inactivity such as sitting longer than an hour when not asleep. This helps prevent blood clots.  You may return to work once you are authorized by your doctor.     WEIGHT BEARING   Weight bearing as tolerated with assist device (walker, cane, etc) as directed, use it as long as suggested by your surgeon or therapist, typically at least 4-6 weeks.   EXERCISES  Results after joint replacement surgery are often greatly improved when you follow the exercise, range of motion and muscle strengthening exercises prescribed by your doctor. Safety measures are also important to  protect the joint from further injury. Any time any of these exercises cause you to have increased pain or swelling, decrease what you are doing until you are comfortable again and then slowly increase them. If  you have problems or questions, call your caregiver or physical therapist for advice.   Rehabilitation is important following a joint replacement. After just a few days of immobilization, the muscles of the leg can become weakened and shrink (atrophy).  These exercises are designed to build up the tone and strength of the thigh and leg muscles and to improve motion. Often times heat used for twenty to thirty minutes before working out will loosen up your tissues and help with improving the range of motion but do not use heat for the first two weeks following surgery (sometimes heat can increase post-operative swelling).   These exercises can be done on a training (exercise) mat, on the floor, on a table or on a bed. Use whatever works the best and is most comfortable for you.    Use music or television while you are exercising so that the exercises are a pleasant break in your day. This will make your life better with the exercises acting as a break in your routine that you can look forward to.   Perform all exercises about fifteen times, three times per day or as directed.  You should exercise both the operative leg and the other leg as well.   Exercises include:   Quad Sets - Tighten up the muscle on the front of the thigh (Quad) and hold for 5-10 seconds.   Straight Leg Raises - With your knee straight (if you were given a brace, keep it on), lift the leg to 60 degrees, hold for 3 seconds, and slowly lower the leg.  Perform this exercise against resistance later as your leg gets stronger.  Leg Slides: Lying on your back, slowly slide your foot toward your buttocks, bending your knee up off the floor (only go as far as is comfortable). Then slowly slide your foot back down until your leg is flat  on the floor again.  Angel Wings: Lying on your back spread your legs to the side as far apart as you can without causing discomfort.  Hamstring Strength:  Lying on your back, push your heel against the floor with your leg straight by tightening up the muscles of your buttocks.  Repeat, but this time bend your knee to a comfortable angle, and push your heel against the floor.  You may put a pillow under the heel to make it more comfortable if necessary.   A rehabilitation program following joint replacement surgery can speed recovery and prevent re-injury in the future due to weakened muscles. Contact your doctor or a physical therapist for more information on knee rehabilitation.    CONSTIPATION  Constipation is defined medically as fewer than three stools per week and severe constipation as less than one stool per week.  Even if you have a regular bowel pattern at home, your normal regimen is likely to be disrupted due to multiple reasons following surgery.  Combination of anesthesia, postoperative narcotics, change in appetite and fluid intake all can affect your bowels.   YOU MUST use at least one of the following options; they are listed in order of increasing strength to get the job done.  They are all available over the counter, and you may need to use some, POSSIBLY even all of these options:    Drink plenty of fluids (prune juice may be helpful) and high fiber foods Colace 100 mg by mouth twice a day  Senokot for constipation as directed and as needed Dulcolax (bisacodyl), take with full glass of water  Miralax (polyethylene glycol) once or twice a day as needed.  If you have tried all these things and are unable to have a bowel movement in the first 3-4 days after surgery call either your surgeon or your primary doctor.    If you experience loose stools or diarrhea, hold the medications until you stool forms back up.  If your symptoms do not get better within 1 week or if they get  worse, check with your doctor.  If you experience "the worst abdominal pain ever" or develop nausea or vomiting, please contact the office immediately for further recommendations for treatment.   ITCHING:  If you experience itching with your medications, try taking only a single pain pill, or even half a pain pill at a time.  You can also use Benadryl over the counter for itching or also to help with sleep.   TED HOSE STOCKINGS:  Use stockings on both legs until for at least 2 weeks or as directed by physician office. They may be removed at night for sleeping.  MEDICATIONS:  See your medication summary on the "After Visit Summary" that nursing will review with you.  You may have some home medications which will be placed on hold until you complete the course of blood thinner medication.  It is important for you to complete the blood thinner medication as prescribed.  PRECAUTIONS:  If you experience chest pain or shortness of breath - call 911 immediately for transfer to the hospital emergency department.   If you develop a fever greater that 101 F, purulent drainage from wound, increased redness or drainage from wound, foul odor from the wound/dressing, or calf pain - CONTACT YOUR SURGEON.                                                   FOLLOW-UP APPOINTMENTS:  If you do not already have a post-op appointment, please call the office for an appointment to be seen by your surgeon.  Guidelines for how soon to be seen are listed in your "After Visit Summary", but are typically between 1-4 weeks after surgery.  OTHER INSTRUCTIONS:   Knee Replacement:  Do not place pillow under knee, focus on keeping the knee straight while resting. CPM instructions: 0-90 degrees, 2 hours in the morning, 2 hours in the afternoon, and 2 hours in the evening. Place foam block, curve side up under heel at all times except when in CPM or when walking.  DO NOT modify, tear, cut, or change the foam block in any way.  MAKE  SURE YOU:  Understand these instructions.  Get help right away if you are not doing well or get worse.    Thank you for letting us be a part of your medical care team.  It is a privilege we respect greatly.  We hope these instructions will help you stay on track for a fast and full recovery!   Increase activity slowly as tolerated   Complete by: As directed    Increase activity slowly as tolerated   Complete by: As directed       DISCHARGE MEDICATIONS:   Allergies as of 06/18/2019      Reactions   Lisinopril Other (See Comments)   BP dropped very low      Medication List    TAKE these medications  acetaminophen 650 MG CR tablet Commonly known as: TYLENOL Take 1,300 mg by mouth every 8 (eight) hours as needed for pain.   aspirin 325 MG EC tablet Take 1 tablet (325 mg total) by mouth 2 (two) times daily. What changed:   medication strength  how much to take  when to take this   losartan-hydrochlorothiazide 100-25 MG tablet Commonly known as: HYZAAR Take 0.5 tablets by mouth daily.   metFORMIN 500 MG tablet Commonly known as: GLUCOPHAGE Take 500 mg by mouth 2 (two) times daily with a meal.   methocarbamol 500 MG tablet Commonly known as: ROBAXIN Take 1-2 tablets (500-1,000 mg total) by mouth every 6 (six) hours as needed for muscle spasms.   oxyCODONE 5 MG immediate release tablet Commonly known as: Oxy IR/ROXICODONE Take 1-2 tablets (5-10 mg total) by mouth every 6 (six) hours as needed for moderate pain (pain score 4-6).            Durable Medical Equipment  (From admission, onward)         Start     Ordered   06/16/19 1146  DME Walker rolling  Once    Question:  Patient needs a walker to treat with the following condition  Answer:  S/P total knee replacement   06/16/19 1145   06/16/19 1146  DME 3 n 1  Once     06/16/19 1145   06/16/19 1146  DME Bedside commode  Once    Question:  Patient needs a bedside commode to treat with the following condition   Answer:  S/P total knee replacement   06/16/19 1145          FOLLOW UP VISIT:   Follow-up Information    [facility] Follow up.   Specialty: [facility] Contact information: [place] [phone]           DISPOSITION: HOME VS. SNF  CONDITION:  Good   Donia Ast 06/18/2019, 1:32 PM

## 2019-06-19 DIAGNOSIS — E039 Hypothyroidism, unspecified: Secondary | ICD-10-CM | POA: Diagnosis not present

## 2019-06-19 DIAGNOSIS — N189 Chronic kidney disease, unspecified: Secondary | ICD-10-CM | POA: Diagnosis not present

## 2019-06-19 DIAGNOSIS — E1140 Type 2 diabetes mellitus with diabetic neuropathy, unspecified: Secondary | ICD-10-CM | POA: Diagnosis not present

## 2019-06-19 DIAGNOSIS — E1122 Type 2 diabetes mellitus with diabetic chronic kidney disease: Secondary | ICD-10-CM | POA: Diagnosis not present

## 2019-06-19 DIAGNOSIS — Z471 Aftercare following joint replacement surgery: Secondary | ICD-10-CM | POA: Diagnosis not present

## 2019-06-19 DIAGNOSIS — Z96651 Presence of right artificial knee joint: Secondary | ICD-10-CM | POA: Diagnosis not present

## 2019-06-19 DIAGNOSIS — G5603 Carpal tunnel syndrome, bilateral upper limbs: Secondary | ICD-10-CM | POA: Diagnosis not present

## 2019-06-19 DIAGNOSIS — M5412 Radiculopathy, cervical region: Secondary | ICD-10-CM | POA: Diagnosis not present

## 2019-06-19 DIAGNOSIS — Z7984 Long term (current) use of oral hypoglycemic drugs: Secondary | ICD-10-CM | POA: Diagnosis not present

## 2019-06-19 DIAGNOSIS — I129 Hypertensive chronic kidney disease with stage 1 through stage 4 chronic kidney disease, or unspecified chronic kidney disease: Secondary | ICD-10-CM | POA: Diagnosis not present

## 2019-06-26 DIAGNOSIS — Z471 Aftercare following joint replacement surgery: Secondary | ICD-10-CM | POA: Diagnosis not present

## 2019-06-26 DIAGNOSIS — Z96651 Presence of right artificial knee joint: Secondary | ICD-10-CM | POA: Diagnosis not present

## 2019-06-27 DIAGNOSIS — Z96651 Presence of right artificial knee joint: Secondary | ICD-10-CM | POA: Diagnosis not present

## 2019-06-27 DIAGNOSIS — M25561 Pain in right knee: Secondary | ICD-10-CM | POA: Diagnosis not present

## 2019-06-27 DIAGNOSIS — R2689 Other abnormalities of gait and mobility: Secondary | ICD-10-CM | POA: Diagnosis not present

## 2019-06-27 DIAGNOSIS — M62551 Muscle wasting and atrophy, not elsewhere classified, right thigh: Secondary | ICD-10-CM | POA: Diagnosis not present

## 2019-06-27 DIAGNOSIS — M25461 Effusion, right knee: Secondary | ICD-10-CM | POA: Diagnosis not present

## 2019-06-30 DIAGNOSIS — M25561 Pain in right knee: Secondary | ICD-10-CM | POA: Diagnosis not present

## 2019-06-30 DIAGNOSIS — M62551 Muscle wasting and atrophy, not elsewhere classified, right thigh: Secondary | ICD-10-CM | POA: Diagnosis not present

## 2019-06-30 DIAGNOSIS — M25461 Effusion, right knee: Secondary | ICD-10-CM | POA: Diagnosis not present

## 2019-06-30 DIAGNOSIS — Z96651 Presence of right artificial knee joint: Secondary | ICD-10-CM | POA: Diagnosis not present

## 2019-06-30 DIAGNOSIS — R2689 Other abnormalities of gait and mobility: Secondary | ICD-10-CM | POA: Diagnosis not present

## 2019-07-01 DIAGNOSIS — M25561 Pain in right knee: Secondary | ICD-10-CM | POA: Diagnosis not present

## 2019-07-01 DIAGNOSIS — M62551 Muscle wasting and atrophy, not elsewhere classified, right thigh: Secondary | ICD-10-CM | POA: Diagnosis not present

## 2019-07-01 DIAGNOSIS — Z96651 Presence of right artificial knee joint: Secondary | ICD-10-CM | POA: Diagnosis not present

## 2019-07-01 DIAGNOSIS — R2689 Other abnormalities of gait and mobility: Secondary | ICD-10-CM | POA: Diagnosis not present

## 2019-07-01 DIAGNOSIS — M25461 Effusion, right knee: Secondary | ICD-10-CM | POA: Diagnosis not present

## 2019-07-04 DIAGNOSIS — M62551 Muscle wasting and atrophy, not elsewhere classified, right thigh: Secondary | ICD-10-CM | POA: Diagnosis not present

## 2019-07-04 DIAGNOSIS — R2689 Other abnormalities of gait and mobility: Secondary | ICD-10-CM | POA: Diagnosis not present

## 2019-07-04 DIAGNOSIS — Z96651 Presence of right artificial knee joint: Secondary | ICD-10-CM | POA: Diagnosis not present

## 2019-07-04 DIAGNOSIS — M25461 Effusion, right knee: Secondary | ICD-10-CM | POA: Diagnosis not present

## 2019-07-04 DIAGNOSIS — M25561 Pain in right knee: Secondary | ICD-10-CM | POA: Diagnosis not present

## 2019-07-07 ENCOUNTER — Other Ambulatory Visit: Payer: Self-pay

## 2019-07-07 ENCOUNTER — Ambulatory Visit (INDEPENDENT_AMBULATORY_CARE_PROVIDER_SITE_OTHER): Payer: PPO | Admitting: Podiatry

## 2019-07-07 ENCOUNTER — Ambulatory Visit (INDEPENDENT_AMBULATORY_CARE_PROVIDER_SITE_OTHER): Payer: PPO

## 2019-07-07 VITALS — Temp 97.0°F | Resp 16

## 2019-07-07 DIAGNOSIS — M2042 Other hammer toe(s) (acquired), left foot: Secondary | ICD-10-CM

## 2019-07-07 DIAGNOSIS — Z96651 Presence of right artificial knee joint: Secondary | ICD-10-CM | POA: Diagnosis not present

## 2019-07-07 DIAGNOSIS — L97422 Non-pressure chronic ulcer of left heel and midfoot with fat layer exposed: Secondary | ICD-10-CM | POA: Diagnosis not present

## 2019-07-07 DIAGNOSIS — M86172 Other acute osteomyelitis, left ankle and foot: Secondary | ICD-10-CM

## 2019-07-07 DIAGNOSIS — M25461 Effusion, right knee: Secondary | ICD-10-CM | POA: Diagnosis not present

## 2019-07-07 DIAGNOSIS — M25561 Pain in right knee: Secondary | ICD-10-CM | POA: Diagnosis not present

## 2019-07-07 DIAGNOSIS — R2689 Other abnormalities of gait and mobility: Secondary | ICD-10-CM | POA: Diagnosis not present

## 2019-07-07 DIAGNOSIS — E08621 Diabetes mellitus due to underlying condition with foot ulcer: Secondary | ICD-10-CM | POA: Diagnosis not present

## 2019-07-07 DIAGNOSIS — M2041 Other hammer toe(s) (acquired), right foot: Secondary | ICD-10-CM

## 2019-07-07 DIAGNOSIS — S90822A Blister (nonthermal), left foot, initial encounter: Secondary | ICD-10-CM

## 2019-07-07 DIAGNOSIS — M62551 Muscle wasting and atrophy, not elsewhere classified, right thigh: Secondary | ICD-10-CM | POA: Diagnosis not present

## 2019-07-07 MED ORDER — CIPROFLOXACIN HCL 250 MG PO TABS: 250.0000 mg | ORAL_TABLET | Freq: Two times a day (BID) | ORAL | 0 refills | Status: DC

## 2019-07-07 MED ORDER — CLINDAMYCIN HCL 300 MG PO CAPS: 300.0000 mg | ORAL_CAPSULE | Freq: Two times a day (BID) | ORAL | 0 refills | Status: DC

## 2019-07-07 NOTE — Progress Notes (Signed)
Subjective:  Patient ID: Terrence Cisneros, male    DOB: [birth year],  MRN: [id number]  Chief Complaint  Patient presents with  . Blister    Lt sub met 1 x Thurs; very sore and tender - pt states blister popped (clear fluid), w/ redness and swelling and bloody spot on bandage Tx: abx cream, and bandage -pt states he had 2 abx tabs form last OV  and it seemed it helped with the swelling -pt states he had total Rt knee replaemetn 3 wks ago  . Diabetes    FBS: "not checking" A1C; >7    78 y.o. male presents for wound care. Hx a above. Unsure how the wound worsened. Has not seen Terrence Cisneros for the wound since 04/17/2019. Has taken abx that he had from his last outpatient visit. States the area formed a blister that popped and formed clear fluid. Reports redness and swelling. Has been using abx cream and a band-aid.  Had a right knee replacement 3 weeks ago.  Review of Systems: Negative except as noted in the HPI. Denies N/V/F/Ch.  Past Medical History:  Diagnosis Date  . Acquired hypothyroidism   . Arthritis   . Cervical radiculopathy    "fingers get cold and my neck pops when i turn it dide to side, theyre going to send me for a nerve induction next month"  . Chronic kidney disease    "they told me a couple years ago  to avoid taking ibuprofen because my kidneys werent 100%"  . Environmental allergies   . Hypertension   . Neuropathy    "ive got numbness in my left foot "   . Type 2 diabetes mellitus (HCC)     Current Outpatient Medications:  .  acetaminophen (TYLENOL) 650 MG CR tablet, Take 1,300 mg by mouth every 8 (eight) hours as needed for pain., Disp: , Rfl:  .  aspirin EC 325 MG EC tablet, Take 1 tablet (325 mg total) by mouth 2 (two) times daily., Disp: 30 tablet, Rfl: 0 .  ciprofloxacin (CIPRO) 250 MG tablet, Take 1 tablet (250 mg total) by mouth 2 (two) times daily., Disp: 8 tablet, Rfl: 0 .  clindamycin (CLEOCIN) 300 MG capsule, Take 1 capsule (300 mg total) by mouth 2 (two)  times daily., Disp: 8 capsule, Rfl: 0 .  gabapentin (NEURONTIN) 100 MG capsule, TAKE 1 CAPSULE BY MOUTH THREE TIMES A DAY, Disp: , Rfl:  .  losartan-hydrochlorothiazide (HYZAAR) 100-25 MG tablet, Take 0.5 tablets by mouth daily. , Disp: , Rfl:  .  metFORMIN (GLUCOPHAGE) 500 MG tablet, Take 500 mg by mouth 2 (two) times daily with a meal. , Disp: , Rfl:  .  methocarbamol (ROBAXIN) 500 MG tablet, Take 1-2 tablets (500-1,000 mg total) by mouth every 6 (six) hours as needed for muscle spasms., Disp: 60 tablet, Rfl: 0 .  oxyCODONE (OXY IR/ROXICODONE) 5 MG immediate release tablet, Take 1-2 tablets (5-10 mg total) by mouth every 6 (six) hours as needed for moderate pain (pain score 4-6)., Disp: 50 tablet, Rfl: 0  Social History   Tobacco Use  Smoking Status Never Smoker  Smokeless Tobacco Never Used    Allergies  Allergen Reactions  . Lisinopril Other (See Comments)    BP dropped very low   Objective:   Vitals:   07/07/19 1154  Resp: 16  Temp: (!) 97 F (36.1 C)   There is no height or weight on file to calculate BMI. Constitutional Well developed. Well nourished.  Vascular  Dorsalis pedis pulses palpable bilaterally. Posterior tibial pulses palpable bilaterally. Capillary refill normal to all digits.  No cyanosis or clubbing noted. Pedal hair growth normal.  Neurologic Normal speech. Oriented to person, place, and time. Protective sensation absent  Dermatologic Wound Location: left 1st MPJ Wound Base: Mixed Granular/Fibrotic Peri-wound: Reddened, Calloused, macerated Exudate: Scant/small amount Serosanguinous exudate Wound Measurements: -2x1 post debridement Wound probed 1 cm to bone after removal of hyperkeratotic covering with 6 to 12 o clock undermining. Medial bulla deroofed with underlying maceration. Local warmth and erythema without ascending cellulitis.  Orthopedic: No pain to palpation either foot.   Radiographs: taken and reviewed no osseous erosion noted at the  1st metatarsal. Degenerative changes noted. Bipartite sesamoids. Assessment:   1. Blister of left foot, initial encounter   2. 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 Hammer toes of both feet   4. Acute osteomyelitis of left ankle or foot ([place])    Plan:  Patient was evaluated and treated and all questions answered.  Ulcer Left 1st MPJ, with local infection. -Debridement as below. Bulla incised and deroofed. -Dressed with silvadene, DSD. -Continue off-loading with surgical shoe. -Protect in CAM walker boot. Medically necessary to offload the ulcer, prevent worsening -Deep culture taken of the wound after sterile skin prep and wound debridement. -Rx Clinda/Cipro for broad spectrum coverage. Will tailor abx based upon culture. -Order MRI left foot w/o contrast to eval for OM.  Procedure: Excisional Debridement of Wound Rationale: Removal of non-viable soft tissue from the wound to promote healing.  Anesthesia: none Pre-Debridement Wound Measurements: overlying hyperkeratosis Post-Debridement Wound Measurements: 2 cm x 1 cm x 1 cm  Type of Debridement: Sharp Excisional Tissue Removed: Non-viable soft tissue Depth of Debridement: subcutaneous tissue. Technique: Sharp excisional debridement to bleeding, viable wound base.  Dressing: Dry, sterile, compression dressing. Disposition: Patient tolerated procedure well. Patient to return in 1 week for follow-up.  No follow-ups on file.

## 2019-07-07 NOTE — Addendum Note (Signed)
Addended by: Allean Found on: 07/07/2019 04:46 PM   Modules accepted: Orders

## 2019-07-09 ENCOUNTER — Other Ambulatory Visit: Payer: Self-pay | Admitting: Sports Medicine

## 2019-07-09 DIAGNOSIS — R2689 Other abnormalities of gait and mobility: Secondary | ICD-10-CM | POA: Diagnosis not present

## 2019-07-09 DIAGNOSIS — M62551 Muscle wasting and atrophy, not elsewhere classified, right thigh: Secondary | ICD-10-CM | POA: Diagnosis not present

## 2019-07-09 DIAGNOSIS — M25461 Effusion, right knee: Secondary | ICD-10-CM | POA: Diagnosis not present

## 2019-07-09 DIAGNOSIS — Z96651 Presence of right artificial knee joint: Secondary | ICD-10-CM | POA: Diagnosis not present

## 2019-07-09 DIAGNOSIS — M25561 Pain in right knee: Secondary | ICD-10-CM | POA: Diagnosis not present

## 2019-07-09 MED ORDER — AMOXICILLIN-POT CLAVULANATE 875-125 MG PO TABS: 1.0000 | ORAL_TABLET | Freq: Two times a day (BID) | ORAL | 0 refills | Status: DC

## 2019-07-09 NOTE — Progress Notes (Signed)
Patient stopped by office and said the cipro and clindamycin was making him sick and dizzy. Reviewed cultures and had patient to discontinue these medications and started Augmentin instead until I can re-evaluate him on Friday -Dr. Cannon Kettle

## 2019-07-10 ENCOUNTER — Other Ambulatory Visit: Payer: Self-pay

## 2019-07-10 LAB — WOUND CULTURE: Organism ID, Bacteria: NONE SEEN

## 2019-07-11 ENCOUNTER — Encounter: Payer: Self-pay | Admitting: Sports Medicine

## 2019-07-11 ENCOUNTER — Ambulatory Visit (INDEPENDENT_AMBULATORY_CARE_PROVIDER_SITE_OTHER): Payer: PPO | Admitting: Sports Medicine

## 2019-07-11 ENCOUNTER — Other Ambulatory Visit: Payer: Self-pay

## 2019-07-11 ENCOUNTER — Telehealth: Payer: Self-pay | Admitting: *Deleted

## 2019-07-11 VITALS — Temp 98.4°F | Resp 16

## 2019-07-11 DIAGNOSIS — M86172 Other acute osteomyelitis, left ankle and foot: Secondary | ICD-10-CM

## 2019-07-11 DIAGNOSIS — R2689 Other abnormalities of gait and mobility: Secondary | ICD-10-CM | POA: Diagnosis not present

## 2019-07-11 DIAGNOSIS — L97422 Non-pressure chronic ulcer of left heel and midfoot with fat layer exposed: Secondary | ICD-10-CM

## 2019-07-11 DIAGNOSIS — Q667 Congenital pes cavus, unspecified foot: Secondary | ICD-10-CM

## 2019-07-11 DIAGNOSIS — M25461 Effusion, right knee: Secondary | ICD-10-CM | POA: Diagnosis not present

## 2019-07-11 DIAGNOSIS — Z96651 Presence of right artificial knee joint: Secondary | ICD-10-CM | POA: Diagnosis not present

## 2019-07-11 DIAGNOSIS — E1165 Type 2 diabetes mellitus with hyperglycemia: Secondary | ICD-10-CM

## 2019-07-11 DIAGNOSIS — M25561 Pain in right knee: Secondary | ICD-10-CM | POA: Diagnosis not present

## 2019-07-11 DIAGNOSIS — E08621 Diabetes mellitus due to underlying condition with foot ulcer: Secondary | ICD-10-CM

## 2019-07-11 DIAGNOSIS — M62551 Muscle wasting and atrophy, not elsewhere classified, right thigh: Secondary | ICD-10-CM | POA: Diagnosis not present

## 2019-07-11 NOTE — Progress Notes (Signed)
Subjective: Terrence Cisneros is a 78 y.o. male patient seen in office for follow up evaluation of ulceration of the left foot.  Patient reports that he saw Dr. March Rummage on Monday and states that the ulcer was going really deep to bone and Dr. March Rummage wanted him to come in again today to be reevaluated.  Patient reports that he is doing better since Wednesday we changed his oral antibiotics to Augmentin.  Patient has his wife helping with applying antibiotic cream to the area and reports that the boot seems like it is helping to keep some pressure off. Denies nausea/fever/vomiting/chills/night sweats/shortness of breath/pain. Patient has no other pedal complaints at this time.  FBS Don't Check Last A1c under 7   Patient Active Problem List   Diagnosis Date Noted  . 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 Acquired hypothyroidism 05/01/2019  . Chronic pain of right knee 05/01/2019  . Environmental and seasonal allergies 05/01/2019  . Essential hypertension 05/01/2019  . Screening for colon cancer 05/01/2019  . Type 2 diabetes mellitus with hyperglycemia, without long-term current use of insulin ([place]) 05/01/2019  . Controlled type 2 diabetes mellitus with hyperglycemia, without long-term current use of insulin ([place]) 01/31/2019  . Cellulitis of right foot 03/05/2018  . Localized edema 02/27/2018  . Pre-ulcerative calluses 02/27/2018  . 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 Arthritis 03/13/2017  . Bilateral carpal tunnel syndrome 03/13/2017  . Clumsiness 03/13/2017  . Right elbow pain 08/29/2016  . History of cholecystectomy 02/22/2012   Current Outpatient Medications on File Prior to Visit  Medication Sig Dispense Refill  . acetaminophen (TYLENOL) 650 MG CR tablet Take 1,300 mg by mouth every 8 (eight) hours as needed for pain.    Marland Kitchen amoxicillin-clavulanate (AUGMENTIN) 875-125 MG tablet Take 1 tablet by mouth 2 (two) times daily. 28 tablet 0  . aspirin EC 325 MG EC tablet Take 1 tablet (325 mg total) by  mouth 2 (two) times daily. 30 tablet 0  . ciprofloxacin (CIPRO) 250 MG tablet Take 1 tablet (250 mg total) by mouth 2 (two) times daily. 8 tablet 0  . clindamycin (CLEOCIN) 300 MG capsule Take 1 capsule (300 mg total) by mouth 2 (two) times daily. 8 capsule 0  . gabapentin (NEURONTIN) 100 MG capsule TAKE 1 CAPSULE BY MOUTH THREE TIMES A DAY    . losartan-hydrochlorothiazide (HYZAAR) 100-25 MG tablet Take 0.5 tablets by mouth daily.     . metFORMIN (GLUCOPHAGE) 500 MG tablet Take 500 mg by mouth 2 (two) times daily with a meal.     . methocarbamol (ROBAXIN) 500 MG tablet Take 1-2 tablets (500-1,000 mg total) by mouth every 6 (six) hours as needed for muscle spasms. 60 tablet 0  . oxyCODONE (OXY IR/ROXICODONE) 5 MG immediate release tablet Take 1-2 tablets (5-10 mg total) by mouth every 6 (six) hours as needed for moderate pain (pain score 4-6). 50 tablet 0   No current facility-administered medications on file prior to visit.    Allergies  Allergen Reactions  . Lisinopril Other (See Comments)    BP dropped very low    Recent Results (from the past 2160 hour(s))  Glucose, capillary     Status: Abnormal   Collection Time: 06/11/19  8:08 AM  Result Value Ref Range   Glucose-Capillary 149 (H) 70 - 99 mg/dL  Hemoglobin A1c     Status: Abnormal   Collection Time: 06/11/19  8:56 AM  Result Value Ref Range   Hgb A1c MFr Bld 6.4 (H) 4.8 -  5.6 %    Comment: (NOTE) Pre diabetes:          5.7%-6.4% Diabetes:              >6.4% Glycemic control for   <7.0% adults with diabetes    Mean Plasma Glucose 136.98 mg/dL    Comment: Performed at [facility] [address], [place], [place] [address]  CBC WITH DIFFERENTIAL     Status: Abnormal   Collection Time: 06/11/19  8:56 AM  Result Value Ref Range   WBC 9.5 4.0 - 10.5 K/uL   RBC 4.49 4.22 - 5.81 MIL/uL   Hemoglobin 13.6 13.0 - 17.0 g/dL   HCT 41.5 39.0 - 52.0 %   MCV 92.4 80.0 - 100.0 fL   MCH 30.3 26.0 - 34.0 pg   MCHC 32.8 30.0 -  36.0 g/dL   RDW 12.7 11.5 - 15.5 %   Platelets 292 150 - 400 K/uL   nRBC 0.0 0.0 - 0.2 %   Neutrophils Relative % 58 %   Neutro Abs 5.5 1.7 - 7.7 K/uL   Lymphocytes Relative 24 %   Lymphs Abs 2.3 0.7 - 4.0 K/uL   Monocytes Relative 13 %   Monocytes Absolute 1.3 (H) 0.1 - 1.0 K/uL   Eosinophils Relative 4 %   Eosinophils Absolute 0.4 0.0 - 0.5 K/uL   Basophils Relative 1 %   Basophils Absolute 0.1 0.0 - 0.1 K/uL   Immature Granulocytes 0 %   Abs Immature Granulocytes 0.04 0.00 - 0.07 K/uL    Comment: Performed at [facility], [place] [address] [place], [place] [address]  Comprehensive metabolic panel     Status: Abnormal   Collection Time: 06/11/19  8:56 AM  Result Value Ref Range   Sodium 140 135 - 145 mmol/L   Potassium 3.9 3.5 - 5.1 mmol/L   Chloride 103 98 - 111 mmol/L   CO2 27 22 - 32 mmol/L   Glucose, Bld 114 (H) 70 - 99 mg/dL   BUN 29 (H) 8 - 23 mg/dL   Creatinine, Ser 1.52 (H) 0.61 - 1.24 mg/dL   Calcium 9.5 8.9 - 10.3 mg/dL   Total Protein 7.2 6.5 - 8.1 g/dL   Albumin 4.1 3.5 - 5.0 g/dL   AST 27 15 - 41 U/L   ALT 23 0 - 44 U/L   Alkaline Phosphatase 77 38 - 126 U/L   Total Bilirubin 0.3 0.3 - 1.2 mg/dL   GFR calc non Af Amer 44 (L) >60 mL/min   GFR calc Af Amer 50 (L) >60 mL/min   Anion gap 10 5 - 15    Comment: Performed at [facility], [place] [address], [place], [place] [address]  Surgical pcr screen     Status: None   Collection Time: 06/11/19  8:56 AM   Specimen: Nasal Mucosa; Nasal Swab  Result Value Ref Range   MRSA, PCR NEGATIVE NEGATIVE   Staphylococcus aureus NEGATIVE NEGATIVE    Comment: (NOTE) The Xpert SA Assay (FDA approved for NASAL specimens in patients 10 years of age and older), is one component of a comprehensive surveillance program. It is not intended to diagnose infection nor to guide or monitor treatment. Performed at [facility], [place] [address], [place], [place] [address]   SARS  Coronavirus 2 (Performed in [employer] hospital lab)     Status: None   Collection Time: 06/12/19  1:01 PM   Specimen: Nasal Swab  Result Value Ref Range   SARS Coronavirus 2 NEGATIVE NEGATIVE    Comment: (NOTE) SARS-CoV-2 target nucleic acids are NOT DETECTED. The SARS-CoV-2 RNA is generally detectable in upper and lower respiratory specimens during the acute phase of infection. Negative results do not preclude SARS-CoV-2 infection, do not rule out co-infections with other pathogens, and should not be used as the sole basis for treatment or other patient management decisions. Negative results must be combined with clinical observations, patient history, and epidemiological information. The expected result is Negative. Fact Sheet for Patients: SugarRoll.be Fact Sheet for Healthcare Providers: https://www.woods-mathews.com/ This test is not yet approved or cleared by the [place] FDA and  has been authorized for detection and/or diagnosis of SARS-CoV-2 by FDA under an Emergency Use Authorization (EUA). This EUA will remain  in effect (meaning this test can be used) for the duration of the COVID-19 declaration under Section 56 4(b)(1) of the Act, 21 U.S.C. section 360bbb-3(b)(1), unless the authorization is terminated or revoked sooner. Performed at [facility], [place] [address], [place], [place] [address]   Glucose, capillary     Status: Abnormal   Collection Time: 06/16/19  6:17 AM  Result Value Ref Range   Glucose-Capillary 118 (H) 70 - 99 mg/dL   Comment 1 Notify RN    Comment 2 Document in Chart   Glucose, capillary     Status: Abnormal   Collection Time: 06/16/19 10:15 AM  Result Value Ref Range   Glucose-Capillary 116 (H) 70 - 99 mg/dL  Glucose, capillary     Status: Abnormal   Collection Time: 06/16/19  9:56 PM  Result Value Ref Range   Glucose-Capillary 219 (H) 70 - 99 mg/dL   Comment 1 Notify RN   CBC     Status:  Abnormal   Collection Time: 06/17/19  3:20 AM  Result Value Ref Range   WBC 19.3 (H) 4.0 - 10.5 K/uL   RBC 3.75 (L) 4.22 - 5.81 MIL/uL   Hemoglobin 11.3 (L) 13.0 - 17.0 g/dL   HCT 34.1 (L) 39.0 - 52.0 %   MCV 90.9 80.0 - 100.0 fL   MCH 30.1 26.0 - 34.0 pg   MCHC 33.1 30.0 - 36.0 g/dL   RDW 12.3 11.5 - 15.5 %   Platelets 219 150 - 400 K/uL   nRBC 0.0 0.0 - 0.2 %    Comment: Performed at [facility], [place] [address], [place], [place] [address]  Basic metabolic panel     Status: Abnormal   Collection Time: 06/17/19  3:20 AM  Result Value Ref Range   Sodium 133 (L) 135 - 145 mmol/L   Potassium 3.7 3.5 - 5.1 mmol/L   Chloride 102 98 - 111 mmol/L   CO2 22 22 - 32 mmol/L   Glucose, Bld 149 (H) 70 - 99 mg/dL   BUN 34 (H) 8 - 23 mg/dL   Creatinine, Ser 1.82 (H) 0.61 - 1.24 mg/dL   Calcium 7.7 (L) 8.9 - 10.3 mg/dL   GFR calc non Af Amer 35 (L) >60 mL/min   GFR calc Af Amer 41 (L) >60 mL/min   Anion gap 9 5 - 15    Comment: Performed at [facility], [place] [address] [place], [place] [address]  Glucose, capillary     Status: Abnormal   Collection Time: 06/17/19  7:22 AM  Result Value Ref Range   Glucose-Capillary 126 (H) 70 - 99 mg/dL  Glucose, capillary  Status: Abnormal   Collection Time: 06/17/19 11:31 AM  Result Value Ref Range   Glucose-Capillary 120 (H) 70 - 99 mg/dL  Glucose, capillary     Status: Abnormal   Collection Time: 06/17/19  8:33 PM  Result Value Ref Range   Glucose-Capillary 111 (H) 70 - 99 mg/dL  CBC     Status: Abnormal   Collection Time: 06/18/19  3:40 AM  Result Value Ref Range   WBC 14.2 (H) 4.0 - 10.5 K/uL   RBC 4.02 (L) 4.22 - 5.81 MIL/uL   Hemoglobin 12.2 (L) 13.0 - 17.0 g/dL   HCT 36.9 (L) 39.0 - 52.0 %   MCV 91.8 80.0 - 100.0 fL   MCH 30.3 26.0 - 34.0 pg   MCHC 33.1 30.0 - 36.0 g/dL   RDW 12.6 11.5 - 15.5 %   Platelets 173 150 - 400 K/uL   nRBC 0.0 0.0 - 0.2 %    Comment: Performed at [facility], [place] [address], [place], [place] [address]  WOUND CULTURE     Status: Abnormal   Collection Time: 07/07/19 12:00 PM   Specimen: Foot, Left; Wound   WOUND CULTURE AND SENS  Result Value Ref Range   Gram Stain Result Final report    Organism ID, Bacteria Comment     Comment: No white blood cells seen.   Organism ID, Bacteria No organisms seen    Aerobic Bacterial Culture Final report (A)    Organism ID, Bacteria Enterococcus faecalis (A)     Comment: Light growth   Antimicrobial Susceptibility Comment     Comment:       ** S = Susceptible; I = Intermediate; R = Resistant **                    P = Positive; N = Negative             MICS are expressed in micrograms per mL    Antibiotic                 RSLT#1    RSLT#2    RSLT#3    RSLT#4 Penicillin                     S Vancomycin                     S     Objective: There were no vitals filed for this visit.  General: Patient is awake, alert, oriented x 3 and in no acute distress.  Dermatology: Skin is warm and dry bilateral with a ulceration that measures 0.1 x 0.2 that probes 0.3 cm to soft tissue in range bone or joint capsule was not appreciated at this visit, there is decreased redness warmth swelling to the area.  No other signs of infection.  Callus sub met 5 on left   Vascular: Dorsalis Pedis pulse = 1/4 Bilateral,  Posterior Tibial pulse = 1/4 Bilateral,  Capillary Fill Time < 5 seconds  Neurologic: Protective sensation severely diminished bilateral left greater than right.  Musculosketal: There is minimal tenderness palpation to ulceration plantar left foot.  There is significant digital contractures on the left foot and pes cavus foot type.  No results for input(s): GRAMSTAIN, LABORGA in the last 8760 hours.  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 -Patient to return to office after MRI or sooner if problems arise.  Landis Martins, DPM

## 2019-07-11 NOTE — Telephone Encounter (Signed)
-----   Message from Terrence Cisneros, [place] sent at 07/11/2019 12:30 PM EDT ----- Regarding: MRI status Patient was seen in office today and has not heard about his MRI Dr. March Rummage ordered an MRI on 727 for the patient to evaluate for osteomyelitis at the left first metatarsal phalangeal joint at area of ulcer; Please call patient to update him on the status of his MRI.

## 2019-07-14 DIAGNOSIS — R2689 Other abnormalities of gait and mobility: Secondary | ICD-10-CM | POA: Diagnosis not present

## 2019-07-14 DIAGNOSIS — M62551 Muscle wasting and atrophy, not elsewhere classified, right thigh: Secondary | ICD-10-CM | POA: Diagnosis not present

## 2019-07-14 DIAGNOSIS — Z96651 Presence of right artificial knee joint: Secondary | ICD-10-CM | POA: Diagnosis not present

## 2019-07-14 DIAGNOSIS — M25561 Pain in right knee: Secondary | ICD-10-CM | POA: Diagnosis not present

## 2019-07-14 DIAGNOSIS — M25461 Effusion, right knee: Secondary | ICD-10-CM | POA: Diagnosis not present

## 2019-07-14 NOTE — Telephone Encounter (Signed)
[facility] scheduled [phone] left foot MRI for 07/16/2019 arrive 4:15pm imaging at 4:30pm. Faxed orders to [facility].

## 2019-07-14 NOTE — Telephone Encounter (Signed)
I Informed pt of [facility] appt at 4:15pm 07/16/2019.

## 2019-07-16 DIAGNOSIS — R2689 Other abnormalities of gait and mobility: Secondary | ICD-10-CM | POA: Diagnosis not present

## 2019-07-16 DIAGNOSIS — Z96651 Presence of right artificial knee joint: Secondary | ICD-10-CM | POA: Diagnosis not present

## 2019-07-16 DIAGNOSIS — M86172 Other acute osteomyelitis, left ankle and foot: Secondary | ICD-10-CM | POA: Diagnosis not present

## 2019-07-16 DIAGNOSIS — R600 Localized edema: Secondary | ICD-10-CM | POA: Diagnosis not present

## 2019-07-16 DIAGNOSIS — M25461 Effusion, right knee: Secondary | ICD-10-CM | POA: Diagnosis not present

## 2019-07-16 DIAGNOSIS — M62551 Muscle wasting and atrophy, not elsewhere classified, right thigh: Secondary | ICD-10-CM | POA: Diagnosis not present

## 2019-07-16 DIAGNOSIS — E1165 Type 2 diabetes mellitus with hyperglycemia: Secondary | ICD-10-CM | POA: Diagnosis not present

## 2019-07-16 DIAGNOSIS — L97529 Non-pressure chronic ulcer of other part of left foot with unspecified severity: Secondary | ICD-10-CM | POA: Diagnosis not present

## 2019-07-16 DIAGNOSIS — M25561 Pain in right knee: Secondary | ICD-10-CM | POA: Diagnosis not present

## 2019-07-16 DIAGNOSIS — L97524 Non-pressure chronic ulcer of other part of left foot with necrosis of bone: Secondary | ICD-10-CM | POA: Diagnosis not present

## 2019-07-18 DIAGNOSIS — M25561 Pain in right knee: Secondary | ICD-10-CM | POA: Diagnosis not present

## 2019-07-18 DIAGNOSIS — G5623 Lesion of ulnar nerve, bilateral upper limbs: Secondary | ICD-10-CM | POA: Diagnosis not present

## 2019-07-18 DIAGNOSIS — M62551 Muscle wasting and atrophy, not elsewhere classified, right thigh: Secondary | ICD-10-CM | POA: Diagnosis not present

## 2019-07-18 DIAGNOSIS — G5603 Carpal tunnel syndrome, bilateral upper limbs: Secondary | ICD-10-CM | POA: Diagnosis not present

## 2019-07-18 DIAGNOSIS — E1165 Type 2 diabetes mellitus with hyperglycemia: Secondary | ICD-10-CM | POA: Diagnosis not present

## 2019-07-18 DIAGNOSIS — R2689 Other abnormalities of gait and mobility: Secondary | ICD-10-CM | POA: Diagnosis not present

## 2019-07-18 DIAGNOSIS — Z96651 Presence of right artificial knee joint: Secondary | ICD-10-CM | POA: Diagnosis not present

## 2019-07-18 DIAGNOSIS — M25461 Effusion, right knee: Secondary | ICD-10-CM | POA: Diagnosis not present

## 2019-07-24 DIAGNOSIS — M25561 Pain in right knee: Secondary | ICD-10-CM | POA: Diagnosis not present

## 2019-07-24 DIAGNOSIS — M62551 Muscle wasting and atrophy, not elsewhere classified, right thigh: Secondary | ICD-10-CM | POA: Diagnosis not present

## 2019-07-24 DIAGNOSIS — M25461 Effusion, right knee: Secondary | ICD-10-CM | POA: Diagnosis not present

## 2019-07-24 DIAGNOSIS — R2689 Other abnormalities of gait and mobility: Secondary | ICD-10-CM | POA: Diagnosis not present

## 2019-07-24 DIAGNOSIS — Z96651 Presence of right artificial knee joint: Secondary | ICD-10-CM | POA: Diagnosis not present

## 2019-07-29 DIAGNOSIS — M62551 Muscle wasting and atrophy, not elsewhere classified, right thigh: Secondary | ICD-10-CM | POA: Diagnosis not present

## 2019-07-29 DIAGNOSIS — G5603 Carpal tunnel syndrome, bilateral upper limbs: Secondary | ICD-10-CM | POA: Diagnosis not present

## 2019-07-29 DIAGNOSIS — M1811 Unilateral primary osteoarthritis of first carpometacarpal joint, right hand: Secondary | ICD-10-CM | POA: Diagnosis not present

## 2019-07-29 DIAGNOSIS — G5623 Lesion of ulnar nerve, bilateral upper limbs: Secondary | ICD-10-CM | POA: Diagnosis not present

## 2019-07-29 DIAGNOSIS — M25561 Pain in right knee: Secondary | ICD-10-CM | POA: Diagnosis not present

## 2019-07-29 DIAGNOSIS — M79641 Pain in right hand: Secondary | ICD-10-CM | POA: Diagnosis not present

## 2019-07-29 DIAGNOSIS — M25461 Effusion, right knee: Secondary | ICD-10-CM | POA: Diagnosis not present

## 2019-07-29 DIAGNOSIS — M1812 Unilateral primary osteoarthritis of first carpometacarpal joint, left hand: Secondary | ICD-10-CM | POA: Diagnosis not present

## 2019-07-29 DIAGNOSIS — M159 Polyosteoarthritis, unspecified: Secondary | ICD-10-CM | POA: Diagnosis not present

## 2019-07-29 DIAGNOSIS — Z96651 Presence of right artificial knee joint: Secondary | ICD-10-CM | POA: Diagnosis not present

## 2019-07-29 DIAGNOSIS — R2689 Other abnormalities of gait and mobility: Secondary | ICD-10-CM | POA: Diagnosis not present

## 2019-07-31 DIAGNOSIS — M62551 Muscle wasting and atrophy, not elsewhere classified, right thigh: Secondary | ICD-10-CM | POA: Diagnosis not present

## 2019-07-31 DIAGNOSIS — M25561 Pain in right knee: Secondary | ICD-10-CM | POA: Diagnosis not present

## 2019-07-31 DIAGNOSIS — M25461 Effusion, right knee: Secondary | ICD-10-CM | POA: Diagnosis not present

## 2019-07-31 DIAGNOSIS — Z96651 Presence of right artificial knee joint: Secondary | ICD-10-CM | POA: Diagnosis not present

## 2019-07-31 DIAGNOSIS — R2689 Other abnormalities of gait and mobility: Secondary | ICD-10-CM | POA: Diagnosis not present

## 2019-08-04 DIAGNOSIS — Z96651 Presence of right artificial knee joint: Secondary | ICD-10-CM | POA: Diagnosis not present

## 2019-08-04 DIAGNOSIS — M25461 Effusion, right knee: Secondary | ICD-10-CM | POA: Diagnosis not present

## 2019-08-04 DIAGNOSIS — M25561 Pain in right knee: Secondary | ICD-10-CM | POA: Diagnosis not present

## 2019-08-04 DIAGNOSIS — R2689 Other abnormalities of gait and mobility: Secondary | ICD-10-CM | POA: Diagnosis not present

## 2019-08-04 DIAGNOSIS — M62551 Muscle wasting and atrophy, not elsewhere classified, right thigh: Secondary | ICD-10-CM | POA: Diagnosis not present

## 2019-08-07 DIAGNOSIS — M25561 Pain in right knee: Secondary | ICD-10-CM | POA: Diagnosis not present

## 2019-08-07 DIAGNOSIS — M25461 Effusion, right knee: Secondary | ICD-10-CM | POA: Diagnosis not present

## 2019-08-07 DIAGNOSIS — M62551 Muscle wasting and atrophy, not elsewhere classified, right thigh: Secondary | ICD-10-CM | POA: Diagnosis not present

## 2019-08-07 DIAGNOSIS — R2689 Other abnormalities of gait and mobility: Secondary | ICD-10-CM | POA: Diagnosis not present

## 2019-08-07 DIAGNOSIS — Z96651 Presence of right artificial knee joint: Secondary | ICD-10-CM | POA: Diagnosis not present

## 2019-08-12 DIAGNOSIS — M25561 Pain in right knee: Secondary | ICD-10-CM | POA: Diagnosis not present

## 2019-08-12 DIAGNOSIS — I10 Essential (primary) hypertension: Secondary | ICD-10-CM | POA: Diagnosis not present

## 2019-08-12 DIAGNOSIS — R2689 Other abnormalities of gait and mobility: Secondary | ICD-10-CM | POA: Diagnosis not present

## 2019-08-12 DIAGNOSIS — M47892 Other spondylosis, cervical region: Secondary | ICD-10-CM | POA: Diagnosis not present

## 2019-08-12 DIAGNOSIS — Z96651 Presence of right artificial knee joint: Secondary | ICD-10-CM | POA: Diagnosis not present

## 2019-08-12 DIAGNOSIS — M25461 Effusion, right knee: Secondary | ICD-10-CM | POA: Diagnosis not present

## 2019-08-12 DIAGNOSIS — M5030 Other cervical disc degeneration, unspecified cervical region: Secondary | ICD-10-CM | POA: Diagnosis not present

## 2019-08-12 DIAGNOSIS — M62551 Muscle wasting and atrophy, not elsewhere classified, right thigh: Secondary | ICD-10-CM | POA: Diagnosis not present

## 2019-08-12 DIAGNOSIS — G5603 Carpal tunnel syndrome, bilateral upper limbs: Secondary | ICD-10-CM | POA: Diagnosis not present

## 2019-08-14 DIAGNOSIS — M62551 Muscle wasting and atrophy, not elsewhere classified, right thigh: Secondary | ICD-10-CM | POA: Diagnosis not present

## 2019-08-14 DIAGNOSIS — M25461 Effusion, right knee: Secondary | ICD-10-CM | POA: Diagnosis not present

## 2019-08-14 DIAGNOSIS — Z96651 Presence of right artificial knee joint: Secondary | ICD-10-CM | POA: Diagnosis not present

## 2019-08-14 DIAGNOSIS — R2689 Other abnormalities of gait and mobility: Secondary | ICD-10-CM | POA: Diagnosis not present

## 2019-08-14 DIAGNOSIS — M25561 Pain in right knee: Secondary | ICD-10-CM | POA: Diagnosis not present

## 2019-08-20 DIAGNOSIS — M25461 Effusion, right knee: Secondary | ICD-10-CM | POA: Diagnosis not present

## 2019-08-20 DIAGNOSIS — M25561 Pain in right knee: Secondary | ICD-10-CM | POA: Diagnosis not present

## 2019-08-20 DIAGNOSIS — R2689 Other abnormalities of gait and mobility: Secondary | ICD-10-CM | POA: Diagnosis not present

## 2019-08-20 DIAGNOSIS — Z96651 Presence of right artificial knee joint: Secondary | ICD-10-CM | POA: Diagnosis not present

## 2019-08-20 DIAGNOSIS — M62551 Muscle wasting and atrophy, not elsewhere classified, right thigh: Secondary | ICD-10-CM | POA: Diagnosis not present

## 2019-09-01 DIAGNOSIS — M19042 Primary osteoarthritis, left hand: Secondary | ICD-10-CM | POA: Diagnosis not present

## 2019-09-01 DIAGNOSIS — M19041 Primary osteoarthritis, right hand: Secondary | ICD-10-CM | POA: Diagnosis not present

## 2019-09-01 DIAGNOSIS — G5622 Lesion of ulnar nerve, left upper limb: Secondary | ICD-10-CM | POA: Diagnosis not present

## 2019-09-01 DIAGNOSIS — G5602 Carpal tunnel syndrome, left upper limb: Secondary | ICD-10-CM | POA: Diagnosis not present

## 2019-09-01 DIAGNOSIS — M19021 Primary osteoarthritis, right elbow: Secondary | ICD-10-CM | POA: Diagnosis not present

## 2019-09-01 DIAGNOSIS — G5621 Lesion of ulnar nerve, right upper limb: Secondary | ICD-10-CM | POA: Diagnosis not present

## 2019-09-01 DIAGNOSIS — M19022 Primary osteoarthritis, left elbow: Secondary | ICD-10-CM | POA: Diagnosis not present

## 2019-09-25 DIAGNOSIS — Z96651 Presence of right artificial knee joint: Secondary | ICD-10-CM | POA: Diagnosis not present

## 2019-10-13 DIAGNOSIS — G5602 Carpal tunnel syndrome, left upper limb: Secondary | ICD-10-CM | POA: Diagnosis not present

## 2019-10-13 DIAGNOSIS — G5621 Lesion of ulnar nerve, right upper limb: Secondary | ICD-10-CM | POA: Diagnosis not present

## 2019-10-13 DIAGNOSIS — G5622 Lesion of ulnar nerve, left upper limb: Secondary | ICD-10-CM | POA: Diagnosis not present

## 2019-10-14 DIAGNOSIS — M5412 Radiculopathy, cervical region: Secondary | ICD-10-CM | POA: Diagnosis not present

## 2019-10-14 DIAGNOSIS — G629 Polyneuropathy, unspecified: Secondary | ICD-10-CM | POA: Diagnosis not present

## 2019-10-14 DIAGNOSIS — G5603 Carpal tunnel syndrome, bilateral upper limbs: Secondary | ICD-10-CM | POA: Diagnosis not present

## 2019-10-14 DIAGNOSIS — M5030 Other cervical disc degeneration, unspecified cervical region: Secondary | ICD-10-CM | POA: Diagnosis not present

## 2019-10-30 DIAGNOSIS — M5136 Other intervertebral disc degeneration, lumbar region: Secondary | ICD-10-CM | POA: Diagnosis not present

## 2019-10-30 DIAGNOSIS — M9903 Segmental and somatic dysfunction of lumbar region: Secondary | ICD-10-CM | POA: Diagnosis not present

## 2019-10-30 DIAGNOSIS — M5441 Lumbago with sciatica, right side: Secondary | ICD-10-CM | POA: Diagnosis not present

## 2019-10-30 DIAGNOSIS — M62830 Muscle spasm of back: Secondary | ICD-10-CM | POA: Diagnosis not present

## 2019-10-30 DIAGNOSIS — M9904 Segmental and somatic dysfunction of sacral region: Secondary | ICD-10-CM | POA: Diagnosis not present

## 2019-10-30 DIAGNOSIS — M5137 Other intervertebral disc degeneration, lumbosacral region: Secondary | ICD-10-CM | POA: Diagnosis not present

## 2019-11-03 DIAGNOSIS — M5137 Other intervertebral disc degeneration, lumbosacral region: Secondary | ICD-10-CM | POA: Diagnosis not present

## 2019-11-03 DIAGNOSIS — M62830 Muscle spasm of back: Secondary | ICD-10-CM | POA: Diagnosis not present

## 2019-11-03 DIAGNOSIS — M9904 Segmental and somatic dysfunction of sacral region: Secondary | ICD-10-CM | POA: Diagnosis not present

## 2019-11-03 DIAGNOSIS — M5136 Other intervertebral disc degeneration, lumbar region: Secondary | ICD-10-CM | POA: Diagnosis not present

## 2019-11-03 DIAGNOSIS — M9903 Segmental and somatic dysfunction of lumbar region: Secondary | ICD-10-CM | POA: Diagnosis not present

## 2019-11-03 DIAGNOSIS — M5441 Lumbago with sciatica, right side: Secondary | ICD-10-CM | POA: Diagnosis not present

## 2019-11-04 DIAGNOSIS — M9903 Segmental and somatic dysfunction of lumbar region: Secondary | ICD-10-CM | POA: Diagnosis not present

## 2019-11-04 DIAGNOSIS — M5441 Lumbago with sciatica, right side: Secondary | ICD-10-CM | POA: Diagnosis not present

## 2019-11-04 DIAGNOSIS — M5137 Other intervertebral disc degeneration, lumbosacral region: Secondary | ICD-10-CM | POA: Diagnosis not present

## 2019-11-04 DIAGNOSIS — M9904 Segmental and somatic dysfunction of sacral region: Secondary | ICD-10-CM | POA: Diagnosis not present

## 2019-11-04 DIAGNOSIS — M62830 Muscle spasm of back: Secondary | ICD-10-CM | POA: Diagnosis not present

## 2019-11-04 DIAGNOSIS — M5136 Other intervertebral disc degeneration, lumbar region: Secondary | ICD-10-CM | POA: Diagnosis not present

## 2019-11-05 DIAGNOSIS — M5136 Other intervertebral disc degeneration, lumbar region: Secondary | ICD-10-CM | POA: Diagnosis not present

## 2019-11-05 DIAGNOSIS — M5441 Lumbago with sciatica, right side: Secondary | ICD-10-CM | POA: Diagnosis not present

## 2019-11-05 DIAGNOSIS — M9904 Segmental and somatic dysfunction of sacral region: Secondary | ICD-10-CM | POA: Diagnosis not present

## 2019-11-05 DIAGNOSIS — M62830 Muscle spasm of back: Secondary | ICD-10-CM | POA: Diagnosis not present

## 2019-11-05 DIAGNOSIS — M9903 Segmental and somatic dysfunction of lumbar region: Secondary | ICD-10-CM | POA: Diagnosis not present

## 2019-11-05 DIAGNOSIS — M5137 Other intervertebral disc degeneration, lumbosacral region: Secondary | ICD-10-CM | POA: Diagnosis not present

## 2019-11-10 DIAGNOSIS — M5136 Other intervertebral disc degeneration, lumbar region: Secondary | ICD-10-CM | POA: Diagnosis not present

## 2019-11-10 DIAGNOSIS — M9904 Segmental and somatic dysfunction of sacral region: Secondary | ICD-10-CM | POA: Diagnosis not present

## 2019-11-10 DIAGNOSIS — M62830 Muscle spasm of back: Secondary | ICD-10-CM | POA: Diagnosis not present

## 2019-11-10 DIAGNOSIS — M5441 Lumbago with sciatica, right side: Secondary | ICD-10-CM | POA: Diagnosis not present

## 2019-11-10 DIAGNOSIS — M5137 Other intervertebral disc degeneration, lumbosacral region: Secondary | ICD-10-CM | POA: Diagnosis not present

## 2019-11-10 DIAGNOSIS — M9903 Segmental and somatic dysfunction of lumbar region: Secondary | ICD-10-CM | POA: Diagnosis not present

## 2019-11-12 DIAGNOSIS — M5441 Lumbago with sciatica, right side: Secondary | ICD-10-CM | POA: Diagnosis not present

## 2019-11-12 DIAGNOSIS — M5137 Other intervertebral disc degeneration, lumbosacral region: Secondary | ICD-10-CM | POA: Diagnosis not present

## 2019-11-12 DIAGNOSIS — M5136 Other intervertebral disc degeneration, lumbar region: Secondary | ICD-10-CM | POA: Diagnosis not present

## 2019-11-12 DIAGNOSIS — M9903 Segmental and somatic dysfunction of lumbar region: Secondary | ICD-10-CM | POA: Diagnosis not present

## 2019-11-12 DIAGNOSIS — M9904 Segmental and somatic dysfunction of sacral region: Secondary | ICD-10-CM | POA: Diagnosis not present

## 2019-11-12 DIAGNOSIS — M62830 Muscle spasm of back: Secondary | ICD-10-CM | POA: Diagnosis not present

## 2019-11-14 DIAGNOSIS — M5441 Lumbago with sciatica, right side: Secondary | ICD-10-CM | POA: Diagnosis not present

## 2019-11-14 DIAGNOSIS — M9903 Segmental and somatic dysfunction of lumbar region: Secondary | ICD-10-CM | POA: Diagnosis not present

## 2019-11-14 DIAGNOSIS — M62830 Muscle spasm of back: Secondary | ICD-10-CM | POA: Diagnosis not present

## 2019-11-14 DIAGNOSIS — M5137 Other intervertebral disc degeneration, lumbosacral region: Secondary | ICD-10-CM | POA: Diagnosis not present

## 2019-11-14 DIAGNOSIS — M5136 Other intervertebral disc degeneration, lumbar region: Secondary | ICD-10-CM | POA: Diagnosis not present

## 2019-11-14 DIAGNOSIS — M9904 Segmental and somatic dysfunction of sacral region: Secondary | ICD-10-CM | POA: Diagnosis not present

## 2019-11-17 DIAGNOSIS — M5441 Lumbago with sciatica, right side: Secondary | ICD-10-CM | POA: Diagnosis not present

## 2019-11-17 DIAGNOSIS — M62830 Muscle spasm of back: Secondary | ICD-10-CM | POA: Diagnosis not present

## 2019-11-17 DIAGNOSIS — M5136 Other intervertebral disc degeneration, lumbar region: Secondary | ICD-10-CM | POA: Diagnosis not present

## 2019-11-17 DIAGNOSIS — M9903 Segmental and somatic dysfunction of lumbar region: Secondary | ICD-10-CM | POA: Diagnosis not present

## 2019-11-17 DIAGNOSIS — M9904 Segmental and somatic dysfunction of sacral region: Secondary | ICD-10-CM | POA: Diagnosis not present

## 2019-11-17 DIAGNOSIS — M5137 Other intervertebral disc degeneration, lumbosacral region: Secondary | ICD-10-CM | POA: Diagnosis not present

## 2019-11-19 DIAGNOSIS — M9904 Segmental and somatic dysfunction of sacral region: Secondary | ICD-10-CM | POA: Diagnosis not present

## 2019-11-19 DIAGNOSIS — M5137 Other intervertebral disc degeneration, lumbosacral region: Secondary | ICD-10-CM | POA: Diagnosis not present

## 2019-11-19 DIAGNOSIS — M9903 Segmental and somatic dysfunction of lumbar region: Secondary | ICD-10-CM | POA: Diagnosis not present

## 2019-11-19 DIAGNOSIS — M5136 Other intervertebral disc degeneration, lumbar region: Secondary | ICD-10-CM | POA: Diagnosis not present

## 2019-11-19 DIAGNOSIS — M62830 Muscle spasm of back: Secondary | ICD-10-CM | POA: Diagnosis not present

## 2019-11-19 DIAGNOSIS — M5441 Lumbago with sciatica, right side: Secondary | ICD-10-CM | POA: Diagnosis not present

## 2019-11-21 DIAGNOSIS — M5137 Other intervertebral disc degeneration, lumbosacral region: Secondary | ICD-10-CM | POA: Diagnosis not present

## 2019-11-21 DIAGNOSIS — M9903 Segmental and somatic dysfunction of lumbar region: Secondary | ICD-10-CM | POA: Diagnosis not present

## 2019-11-21 DIAGNOSIS — M5441 Lumbago with sciatica, right side: Secondary | ICD-10-CM | POA: Diagnosis not present

## 2019-11-21 DIAGNOSIS — M62830 Muscle spasm of back: Secondary | ICD-10-CM | POA: Diagnosis not present

## 2019-11-21 DIAGNOSIS — M9904 Segmental and somatic dysfunction of sacral region: Secondary | ICD-10-CM | POA: Diagnosis not present

## 2019-11-21 DIAGNOSIS — M5136 Other intervertebral disc degeneration, lumbar region: Secondary | ICD-10-CM | POA: Diagnosis not present

## 2019-11-24 DIAGNOSIS — S51811A Laceration without foreign body of right forearm, initial encounter: Secondary | ICD-10-CM | POA: Diagnosis not present

## 2019-11-24 DIAGNOSIS — M25561 Pain in right knee: Secondary | ICD-10-CM | POA: Diagnosis not present

## 2019-11-24 DIAGNOSIS — S81011A Laceration without foreign body, right knee, initial encounter: Secondary | ICD-10-CM | POA: Diagnosis not present

## 2019-11-24 DIAGNOSIS — M5136 Other intervertebral disc degeneration, lumbar region: Secondary | ICD-10-CM | POA: Diagnosis not present

## 2019-11-24 DIAGNOSIS — S51011A Laceration without foreign body of right elbow, initial encounter: Secondary | ICD-10-CM | POA: Diagnosis not present

## 2019-11-24 DIAGNOSIS — M5137 Other intervertebral disc degeneration, lumbosacral region: Secondary | ICD-10-CM | POA: Diagnosis not present

## 2019-11-24 DIAGNOSIS — E1165 Type 2 diabetes mellitus with hyperglycemia: Secondary | ICD-10-CM | POA: Diagnosis not present

## 2019-11-24 DIAGNOSIS — M62830 Muscle spasm of back: Secondary | ICD-10-CM | POA: Diagnosis not present

## 2019-11-24 DIAGNOSIS — S0011XA Contusion of right eyelid and periocular area, initial encounter: Secondary | ICD-10-CM | POA: Diagnosis not present

## 2019-11-24 DIAGNOSIS — S00211A Abrasion of right eyelid and periocular area, initial encounter: Secondary | ICD-10-CM | POA: Diagnosis not present

## 2019-11-24 DIAGNOSIS — M25461 Effusion, right knee: Secondary | ICD-10-CM | POA: Diagnosis not present

## 2019-11-24 DIAGNOSIS — M9903 Segmental and somatic dysfunction of lumbar region: Secondary | ICD-10-CM | POA: Diagnosis not present

## 2019-11-24 DIAGNOSIS — M5441 Lumbago with sciatica, right side: Secondary | ICD-10-CM | POA: Diagnosis not present

## 2019-11-24 DIAGNOSIS — M9904 Segmental and somatic dysfunction of sacral region: Secondary | ICD-10-CM | POA: Diagnosis not present

## 2019-11-24 DIAGNOSIS — Z7984 Long term (current) use of oral hypoglycemic drugs: Secondary | ICD-10-CM | POA: Diagnosis not present

## 2019-11-28 DIAGNOSIS — M5137 Other intervertebral disc degeneration, lumbosacral region: Secondary | ICD-10-CM | POA: Diagnosis not present

## 2019-11-28 DIAGNOSIS — M5136 Other intervertebral disc degeneration, lumbar region: Secondary | ICD-10-CM | POA: Diagnosis not present

## 2019-11-28 DIAGNOSIS — M62830 Muscle spasm of back: Secondary | ICD-10-CM | POA: Diagnosis not present

## 2019-11-28 DIAGNOSIS — M5441 Lumbago with sciatica, right side: Secondary | ICD-10-CM | POA: Diagnosis not present

## 2019-11-28 DIAGNOSIS — M9904 Segmental and somatic dysfunction of sacral region: Secondary | ICD-10-CM | POA: Diagnosis not present

## 2019-11-28 DIAGNOSIS — M9903 Segmental and somatic dysfunction of lumbar region: Secondary | ICD-10-CM | POA: Diagnosis not present

## 2019-12-01 DIAGNOSIS — M9903 Segmental and somatic dysfunction of lumbar region: Secondary | ICD-10-CM | POA: Diagnosis not present

## 2019-12-01 DIAGNOSIS — M5136 Other intervertebral disc degeneration, lumbar region: Secondary | ICD-10-CM | POA: Diagnosis not present

## 2019-12-01 DIAGNOSIS — M9904 Segmental and somatic dysfunction of sacral region: Secondary | ICD-10-CM | POA: Diagnosis not present

## 2019-12-01 DIAGNOSIS — M62830 Muscle spasm of back: Secondary | ICD-10-CM | POA: Diagnosis not present

## 2019-12-01 DIAGNOSIS — M5441 Lumbago with sciatica, right side: Secondary | ICD-10-CM | POA: Diagnosis not present

## 2019-12-01 DIAGNOSIS — M5137 Other intervertebral disc degeneration, lumbosacral region: Secondary | ICD-10-CM | POA: Diagnosis not present

## 2019-12-03 DIAGNOSIS — M62830 Muscle spasm of back: Secondary | ICD-10-CM | POA: Diagnosis not present

## 2019-12-03 DIAGNOSIS — M9903 Segmental and somatic dysfunction of lumbar region: Secondary | ICD-10-CM | POA: Diagnosis not present

## 2019-12-03 DIAGNOSIS — M5136 Other intervertebral disc degeneration, lumbar region: Secondary | ICD-10-CM | POA: Diagnosis not present

## 2019-12-03 DIAGNOSIS — M5441 Lumbago with sciatica, right side: Secondary | ICD-10-CM | POA: Diagnosis not present

## 2019-12-03 DIAGNOSIS — M5137 Other intervertebral disc degeneration, lumbosacral region: Secondary | ICD-10-CM | POA: Diagnosis not present

## 2019-12-03 DIAGNOSIS — M9904 Segmental and somatic dysfunction of sacral region: Secondary | ICD-10-CM | POA: Diagnosis not present

## 2019-12-04 DIAGNOSIS — Z23 Encounter for immunization: Secondary | ICD-10-CM | POA: Diagnosis not present

## 2019-12-04 DIAGNOSIS — S81011D Laceration without foreign body, right knee, subsequent encounter: Secondary | ICD-10-CM | POA: Diagnosis not present

## 2019-12-08 DIAGNOSIS — M5441 Lumbago with sciatica, right side: Secondary | ICD-10-CM | POA: Diagnosis not present

## 2019-12-08 DIAGNOSIS — M9904 Segmental and somatic dysfunction of sacral region: Secondary | ICD-10-CM | POA: Diagnosis not present

## 2019-12-08 DIAGNOSIS — M5136 Other intervertebral disc degeneration, lumbar region: Secondary | ICD-10-CM | POA: Diagnosis not present

## 2019-12-08 DIAGNOSIS — M5137 Other intervertebral disc degeneration, lumbosacral region: Secondary | ICD-10-CM | POA: Diagnosis not present

## 2019-12-08 DIAGNOSIS — M62830 Muscle spasm of back: Secondary | ICD-10-CM | POA: Diagnosis not present

## 2019-12-08 DIAGNOSIS — M9903 Segmental and somatic dysfunction of lumbar region: Secondary | ICD-10-CM | POA: Diagnosis not present

## 2019-12-18 DIAGNOSIS — M545 Low back pain: Secondary | ICD-10-CM | POA: Diagnosis not present

## 2019-12-18 DIAGNOSIS — M5030 Other cervical disc degeneration, unspecified cervical region: Secondary | ICD-10-CM | POA: Diagnosis not present

## 2019-12-18 DIAGNOSIS — M549 Dorsalgia, unspecified: Secondary | ICD-10-CM | POA: Diagnosis not present

## 2019-12-18 DIAGNOSIS — M4156 Other secondary scoliosis, lumbar region: Secondary | ICD-10-CM | POA: Diagnosis not present

## 2019-12-18 DIAGNOSIS — M47896 Other spondylosis, lumbar region: Secondary | ICD-10-CM | POA: Diagnosis not present

## 2019-12-22 ENCOUNTER — Inpatient Hospital Stay (HOSPITAL_COMMUNITY)
Admission: EM | Admit: 2019-12-22 | Discharge: 2020-01-12 | DRG: 207 | Disposition: E | Payer: PPO | Attending: Critical Care Medicine | Admitting: Critical Care Medicine

## 2019-12-22 ENCOUNTER — Emergency Department (HOSPITAL_COMMUNITY): Payer: PPO

## 2019-12-22 ENCOUNTER — Other Ambulatory Visit: Payer: Self-pay

## 2019-12-22 ENCOUNTER — Encounter (HOSPITAL_COMMUNITY): Payer: Self-pay | Admitting: *Deleted

## 2019-12-22 DIAGNOSIS — J80 Acute respiratory distress syndrome: Secondary | ICD-10-CM | POA: Diagnosis present

## 2019-12-22 DIAGNOSIS — R918 Other nonspecific abnormal finding of lung field: Secondary | ICD-10-CM | POA: Diagnosis not present

## 2019-12-22 DIAGNOSIS — J129 Viral pneumonia, unspecified: Secondary | ICD-10-CM

## 2019-12-22 DIAGNOSIS — D6489 Other specified anemias: Secondary | ICD-10-CM | POA: Diagnosis present

## 2019-12-22 DIAGNOSIS — I10 Essential (primary) hypertension: Secondary | ICD-10-CM | POA: Diagnosis not present

## 2019-12-22 DIAGNOSIS — M5412 Radiculopathy, cervical region: Secondary | ICD-10-CM | POA: Diagnosis present

## 2019-12-22 DIAGNOSIS — Z7982 Long term (current) use of aspirin: Secondary | ICD-10-CM

## 2019-12-22 DIAGNOSIS — Z978 Presence of other specified devices: Secondary | ICD-10-CM

## 2019-12-22 DIAGNOSIS — A4189 Other specified sepsis: Secondary | ICD-10-CM | POA: Diagnosis not present

## 2019-12-22 DIAGNOSIS — J939 Pneumothorax, unspecified: Secondary | ICD-10-CM | POA: Diagnosis not present

## 2019-12-22 DIAGNOSIS — Z66 Do not resuscitate: Secondary | ICD-10-CM | POA: Diagnosis not present

## 2019-12-22 DIAGNOSIS — Z8249 Family history of ischemic heart disease and other diseases of the circulatory system: Secondary | ICD-10-CM | POA: Diagnosis not present

## 2019-12-22 DIAGNOSIS — Z96651 Presence of right artificial knee joint: Secondary | ICD-10-CM | POA: Diagnosis not present

## 2019-12-22 DIAGNOSIS — E872 Acidosis, unspecified: Secondary | ICD-10-CM | POA: Diagnosis present

## 2019-12-22 DIAGNOSIS — E1122 Type 2 diabetes mellitus with diabetic chronic kidney disease: Secondary | ICD-10-CM | POA: Diagnosis present

## 2019-12-22 DIAGNOSIS — Z9049 Acquired absence of other specified parts of digestive tract: Secondary | ICD-10-CM

## 2019-12-22 DIAGNOSIS — N179 Acute kidney failure, unspecified: Secondary | ICD-10-CM | POA: Diagnosis not present

## 2019-12-22 DIAGNOSIS — Y95 Nosocomial condition: Secondary | ICD-10-CM | POA: Diagnosis not present

## 2019-12-22 DIAGNOSIS — R05 Cough: Secondary | ICD-10-CM | POA: Diagnosis not present

## 2019-12-22 DIAGNOSIS — M1990 Unspecified osteoarthritis, unspecified site: Secondary | ICD-10-CM | POA: Diagnosis not present

## 2019-12-22 DIAGNOSIS — N170 Acute kidney failure with tubular necrosis: Secondary | ICD-10-CM | POA: Diagnosis not present

## 2019-12-22 DIAGNOSIS — I129 Hypertensive chronic kidney disease with stage 1 through stage 4 chronic kidney disease, or unspecified chronic kidney disease: Secondary | ICD-10-CM | POA: Diagnosis present

## 2019-12-22 DIAGNOSIS — R4182 Altered mental status, unspecified: Secondary | ICD-10-CM | POA: Diagnosis not present

## 2019-12-22 DIAGNOSIS — I480 Paroxysmal atrial fibrillation: Secondary | ICD-10-CM | POA: Diagnosis not present

## 2019-12-22 DIAGNOSIS — E039 Hypothyroidism, unspecified: Secondary | ICD-10-CM | POA: Diagnosis not present

## 2019-12-22 DIAGNOSIS — B957 Other staphylococcus as the cause of diseases classified elsewhere: Secondary | ICD-10-CM | POA: Diagnosis not present

## 2019-12-22 DIAGNOSIS — J159 Unspecified bacterial pneumonia: Secondary | ICD-10-CM | POA: Diagnosis present

## 2019-12-22 DIAGNOSIS — E1165 Type 2 diabetes mellitus with hyperglycemia: Secondary | ICD-10-CM | POA: Diagnosis present

## 2019-12-22 DIAGNOSIS — Z4682 Encounter for fitting and adjustment of non-vascular catheter: Secondary | ICD-10-CM | POA: Diagnosis not present

## 2019-12-22 DIAGNOSIS — Z515 Encounter for palliative care: Secondary | ICD-10-CM | POA: Diagnosis not present

## 2019-12-22 DIAGNOSIS — G931 Anoxic brain damage, not elsewhere classified: Secondary | ICD-10-CM | POA: Diagnosis not present

## 2019-12-22 DIAGNOSIS — N1832 Chronic kidney disease, stage 3b: Secondary | ICD-10-CM | POA: Diagnosis not present

## 2019-12-22 DIAGNOSIS — R531 Weakness: Secondary | ICD-10-CM | POA: Diagnosis not present

## 2019-12-22 DIAGNOSIS — Z87442 Personal history of urinary calculi: Secondary | ICD-10-CM | POA: Diagnosis not present

## 2019-12-22 DIAGNOSIS — T380X5A Adverse effect of glucocorticoids and synthetic analogues, initial encounter: Secondary | ICD-10-CM | POA: Diagnosis present

## 2019-12-22 DIAGNOSIS — J439 Emphysema, unspecified: Secondary | ICD-10-CM | POA: Diagnosis not present

## 2019-12-22 DIAGNOSIS — G92 Toxic encephalopathy: Secondary | ICD-10-CM | POA: Diagnosis not present

## 2019-12-22 DIAGNOSIS — E1151 Type 2 diabetes mellitus with diabetic peripheral angiopathy without gangrene: Secondary | ICD-10-CM | POA: Diagnosis present

## 2019-12-22 DIAGNOSIS — J982 Interstitial emphysema: Secondary | ICD-10-CM | POA: Diagnosis not present

## 2019-12-22 DIAGNOSIS — J1289 Other viral pneumonia: Secondary | ICD-10-CM | POA: Diagnosis not present

## 2019-12-22 DIAGNOSIS — I6529 Occlusion and stenosis of unspecified carotid artery: Secondary | ICD-10-CM | POA: Diagnosis present

## 2019-12-22 DIAGNOSIS — U071 COVID-19: Secondary | ICD-10-CM | POA: Diagnosis not present

## 2019-12-22 DIAGNOSIS — E1140 Type 2 diabetes mellitus with diabetic neuropathy, unspecified: Secondary | ICD-10-CM | POA: Diagnosis not present

## 2019-12-22 DIAGNOSIS — R0902 Hypoxemia: Secondary | ICD-10-CM | POA: Diagnosis not present

## 2019-12-22 DIAGNOSIS — R0602 Shortness of breath: Secondary | ICD-10-CM | POA: Diagnosis not present

## 2019-12-22 DIAGNOSIS — J1282 Pneumonia due to coronavirus disease 2019: Secondary | ICD-10-CM | POA: Diagnosis present

## 2019-12-22 DIAGNOSIS — D696 Thrombocytopenia, unspecified: Secondary | ICD-10-CM | POA: Diagnosis not present

## 2019-12-22 DIAGNOSIS — Z79899 Other long term (current) drug therapy: Secondary | ICD-10-CM

## 2019-12-22 DIAGNOSIS — Z7984 Long term (current) use of oral hypoglycemic drugs: Secondary | ICD-10-CM

## 2019-12-22 DIAGNOSIS — D631 Anemia in chronic kidney disease: Secondary | ICD-10-CM | POA: Diagnosis present

## 2019-12-22 DIAGNOSIS — J9601 Acute respiratory failure with hypoxia: Secondary | ICD-10-CM | POA: Diagnosis not present

## 2019-12-22 DIAGNOSIS — X58XXXA Exposure to other specified factors, initial encounter: Secondary | ICD-10-CM | POA: Diagnosis present

## 2019-12-22 DIAGNOSIS — Z888 Allergy status to other drugs, medicaments and biological substances status: Secondary | ICD-10-CM

## 2019-12-22 DIAGNOSIS — J9690 Respiratory failure, unspecified, unspecified whether with hypoxia or hypercapnia: Secondary | ICD-10-CM | POA: Diagnosis not present

## 2019-12-22 DIAGNOSIS — Z4659 Encounter for fitting and adjustment of other gastrointestinal appliance and device: Secondary | ICD-10-CM

## 2019-12-22 DIAGNOSIS — R000 Tachycardia, unspecified: Secondary | ICD-10-CM | POA: Diagnosis present

## 2019-12-22 LAB — CBC
HCT: 39.2 % (ref 39.0–52.0)
Hemoglobin: 13.1 g/dL (ref 13.0–17.0)
MCH: 29.6 pg (ref 26.0–34.0)
MCHC: 33.4 g/dL (ref 30.0–36.0)
MCV: 88.5 fL (ref 80.0–100.0)
Platelets: 273 10*3/uL (ref 150–400)
RBC: 4.43 MIL/uL (ref 4.22–5.81)
RDW: 13.7 % (ref 11.5–15.5)
WBC: 15.9 10*3/uL — ABNORMAL HIGH (ref 4.0–10.5)
nRBC: 0 % (ref 0.0–0.2)

## 2019-12-22 LAB — URINALYSIS, ROUTINE W REFLEX MICROSCOPIC
Bacteria, UA: NONE SEEN
Bilirubin Urine: NEGATIVE
Glucose, UA: 150 mg/dL — AB
Ketones, ur: 5 mg/dL — AB
Leukocytes,Ua: NEGATIVE
Nitrite: NEGATIVE
Protein, ur: 100 mg/dL — AB
Specific Gravity, Urine: 1.023 (ref 1.005–1.030)
pH: 6 (ref 5.0–8.0)

## 2019-12-22 LAB — D-DIMER, QUANTITATIVE: D-Dimer, Quant: 1.88 ug/mL-FEU — ABNORMAL HIGH (ref 0.00–0.50)

## 2019-12-22 LAB — TROPONIN I (HIGH SENSITIVITY)
Troponin I (High Sensitivity): 45 ng/L — ABNORMAL HIGH (ref <18)
Troponin I (High Sensitivity): 47 ng/L — ABNORMAL HIGH (ref <18)

## 2019-12-22 LAB — BRAIN NATRIURETIC PEPTIDE: B Natriuretic Peptide: 104.8 pg/mL — ABNORMAL HIGH (ref 0.0–100.0)

## 2019-12-22 LAB — FIBRINOGEN: Fibrinogen: 682 mg/dL — ABNORMAL HIGH (ref 210–475)

## 2019-12-22 LAB — LACTIC ACID, PLASMA
Lactic Acid, Venous: 2.8 mmol/L (ref 0.5–1.9)
Lactic Acid, Venous: 2.3 mmol/L (ref 0.5–1.9)

## 2019-12-22 LAB — PROCALCITONIN: Procalcitonin: 37.82 ng/mL

## 2019-12-22 LAB — BASIC METABOLIC PANEL
Anion gap: 15 (ref 5–15)
BUN: 56 mg/dL — ABNORMAL HIGH (ref 8–23)
CO2: 23 mmol/L (ref 22–32)
Calcium: 9.2 mg/dL (ref 8.9–10.3)
Chloride: 95 mmol/L — ABNORMAL LOW (ref 98–111)
Creatinine, Ser: 2.26 mg/dL — ABNORMAL HIGH (ref 0.61–1.24)
GFR calc Af Amer: 31 mL/min — ABNORMAL LOW (ref >60)
GFR calc non Af Amer: 27 mL/min — ABNORMAL LOW (ref >60)
Glucose, Bld: 208 mg/dL — ABNORMAL HIGH (ref 70–99)
Potassium: 3.6 mmol/L (ref 3.5–5.1)
Sodium: 133 mmol/L — ABNORMAL LOW (ref 135–145)

## 2019-12-22 LAB — POC SARS CORONAVIRUS 2 AG -  ED: SARS Coronavirus 2 Ag: POSITIVE — AB

## 2019-12-22 LAB — C-REACTIVE PROTEIN: CRP: 21.1 mg/dL — ABNORMAL HIGH (ref <1.0)

## 2019-12-22 LAB — LACTATE DEHYDROGENASE: LDH: 312 U/L — ABNORMAL HIGH (ref 98–192)

## 2019-12-22 LAB — ABO/RH: ABO/RH(D): A POS

## 2019-12-22 MED ORDER — SODIUM CHLORIDE 0.9 % IV SOLN: INTRAVENOUS | Status: AC

## 2019-12-22 MED ORDER — SODIUM CHLORIDE 0.9 % IV SOLN
200.0000 mg | Freq: Once | INTRAVENOUS | Status: AC
  Administered 2019-12-22: 200 mg via INTRAVENOUS
  Filled 2019-12-22: qty 200

## 2019-12-22 MED ORDER — ASPIRIN EC 81 MG PO TBEC
81.0000 mg | DELAYED_RELEASE_TABLET | ORAL | Status: DC
  Administered 2019-12-22 – 2020-01-07 (×8): 81 mg via ORAL
  Filled 2019-12-22 (×13): qty 1

## 2019-12-22 MED ORDER — DEXAMETHASONE SODIUM PHOSPHATE 10 MG/ML IJ SOLN
6.0000 mg | INTRAMUSCULAR | Status: DC
  Administered 2019-12-23 – 2019-12-24 (×2): 6 mg via INTRAVENOUS
  Filled 2019-12-22 (×2): qty 1

## 2019-12-22 MED ORDER — SODIUM CHLORIDE 0.9% FLUSH: 3.0000 mL | Freq: Once | INTRAVENOUS | Status: DC

## 2019-12-22 MED ORDER — ASCORBIC ACID 500 MG PO TABS
500.0000 mg | ORAL_TABLET | Freq: Every day | ORAL | Status: DC
  Administered 2019-12-22 – 2019-12-31 (×10): 500 mg via ORAL
  Filled 2019-12-22 (×10): qty 1

## 2019-12-22 MED ORDER — ZINC SULFATE 220 (50 ZN) MG PO CAPS
220.0000 mg | ORAL_CAPSULE | Freq: Every day | ORAL | Status: DC
  Administered 2019-12-22 – 2019-12-31 (×10): 220 mg via ORAL
  Filled 2019-12-22 (×10): qty 1

## 2019-12-22 MED ORDER — HYDROCHLOROTHIAZIDE 12.5 MG PO CAPS
12.5000 mg | ORAL_CAPSULE | Freq: Every day | ORAL | Status: DC
  Administered 2019-12-22 – 2019-12-23 (×2): 12.5 mg via ORAL
  Filled 2019-12-22 (×2): qty 1

## 2019-12-22 MED ORDER — HYDROCOD POLST-CPM POLST ER 10-8 MG/5ML PO SUER
5.0000 mL | Freq: Two times a day (BID) | ORAL | Status: DC | PRN
  Administered 2019-12-24 – 2019-12-28 (×5): 5 mL via ORAL
  Filled 2019-12-22 (×5): qty 5

## 2019-12-22 MED ORDER — SODIUM CHLORIDE 0.9 % IV SOLN
100.0000 mg | Freq: Every day | INTRAVENOUS | Status: AC
  Administered 2019-12-23 – 2019-12-26 (×4): 100 mg via INTRAVENOUS
  Filled 2019-12-22 (×3): qty 20
  Filled 2019-12-22: qty 100

## 2019-12-22 MED ORDER — ENOXAPARIN SODIUM 40 MG/0.4ML ~~LOC~~ SOLN: 40.0000 mg | SUBCUTANEOUS | Status: DC

## 2019-12-22 MED ORDER — HEPARIN SODIUM (PORCINE) 5000 UNIT/ML IJ SOLN
5000.0000 [IU] | Freq: Three times a day (TID) | INTRAMUSCULAR | Status: DC
  Administered 2019-12-22 – 2019-12-24 (×5): 5000 [IU] via SUBCUTANEOUS
  Filled 2019-12-22 (×6): qty 1

## 2019-12-22 MED ORDER — LOSARTAN POTASSIUM 25 MG PO TABS
50.0000 mg | ORAL_TABLET | Freq: Every day | ORAL | Status: DC
  Administered 2019-12-22 – 2019-12-23 (×2): 50 mg via ORAL
  Filled 2019-12-22 (×2): qty 1

## 2019-12-22 MED ORDER — LACTATED RINGERS IV BOLUS
1000.0000 mL | Freq: Once | INTRAVENOUS | Status: AC
  Administered 2019-12-22: 1000 mL via INTRAVENOUS

## 2019-12-22 MED ORDER — METFORMIN HCL 500 MG PO TABS
500.0000 mg | ORAL_TABLET | Freq: Two times a day (BID) | ORAL | Status: DC
  Administered 2019-12-22 – 2019-12-23 (×3): 500 mg via ORAL
  Filled 2019-12-22 (×4): qty 1

## 2019-12-22 MED ORDER — GUAIFENESIN-DM 100-10 MG/5ML PO SYRP
10.0000 mL | ORAL_SOLUTION | ORAL | Status: DC | PRN
  Administered 2019-12-25 – 2019-12-30 (×5): 10 mL via ORAL
  Filled 2019-12-22 (×5): qty 10

## 2019-12-22 MED ORDER — AZITHROMYCIN 250 MG PO TABS
500.0000 mg | ORAL_TABLET | Freq: Every day | ORAL | Status: AC
  Administered 2019-12-22 – 2019-12-26 (×5): 500 mg via ORAL
  Filled 2019-12-22 (×6): qty 2

## 2019-12-22 MED ORDER — IPRATROPIUM-ALBUTEROL 20-100 MCG/ACT IN AERS
2.0000 | INHALATION_SPRAY | Freq: Four times a day (QID) | RESPIRATORY_TRACT | Status: DC
  Administered 2019-12-22 – 2019-12-31 (×34): 2 via RESPIRATORY_TRACT
  Filled 2019-12-22: qty 4

## 2019-12-22 MED ORDER — LOSARTAN POTASSIUM-HCTZ 100-25 MG PO TABS: 0.5000 | ORAL_TABLET | Freq: Every day | ORAL | Status: DC

## 2019-12-22 MED ORDER — DEXAMETHASONE SODIUM PHOSPHATE 10 MG/ML IJ SOLN
6.0000 mg | Freq: Once | INTRAMUSCULAR | Status: AC
  Administered 2019-12-22: 6 mg via INTRAVENOUS
  Filled 2019-12-22: qty 1

## 2019-12-22 MED ORDER — SODIUM CHLORIDE 0.9 % IV SOLN
2.0000 g | INTRAVENOUS | Status: AC
  Administered 2019-12-22 – 2019-12-26 (×5): 2 g via INTRAVENOUS
  Filled 2019-12-22 (×6): qty 20

## 2019-12-22 NOTE — ED Provider Notes (Signed)
[facility] DEPT Provider Note   CSN: [id number] Arrival date & time: 01/06/2020  [id number]     History Chief Complaint  Patient presents with  . Shortness of Breath  . Weakness    Terrence Cisneros is a 79 y.o. male.  Presents to ER with complaint of cough, shortness of breath.  Patient states he has had symptoms since approximately 10 days ago.  Initially noted mild cough, however has had progressive weakness, shortness of breath.  No loss of taste or smell but has had very poor appetite.  States Christmas he had exposure to a cousin who later tested positive for COVID-19.  No associated chest pain.  States no known fevers but has had some chills.  Had some leftover amoxicillin from the summer that he took with noticed change in symptoms.    HPI     Past Medical History:  Diagnosis Date  . Acquired hypothyroidism   . Arthritis   . Cervical radiculopathy    "fingers get cold and my neck pops when i turn it dide to side, theyre going to send me for a nerve induction next month"  . Chronic kidney disease    "they told me a couple years ago  to avoid taking ibuprofen because my kidneys werent 100%"  . Environmental allergies   . Hypertension   . Neuropathy    "ive got numbness in my left foot "   . Type 2 diabetes mellitus [facility])     Patient Active Problem List   Diagnosis Date Noted  . Pneumonia due to COVID-19 virus 12/16/2019  . AKI (acute kidney injury) ([place]) 12/29/2019  . Lactic acidosis 01/05/2020  . 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 Acquired hypothyroidism 05/01/2019  . Chronic pain of right knee 05/01/2019  . Environmental and seasonal allergies 05/01/2019  . Essential hypertension 05/01/2019  . Screening for colon cancer 05/01/2019  . Type 2 diabetes mellitus with hyperglycemia, without long-term current use of insulin ([place]) 05/01/2019  . Controlled type 2 diabetes mellitus with hyperglycemia, without long-term current use of insulin  ([place]) 01/31/2019  . Cellulitis of right foot 03/05/2018  . Localized edema 02/27/2018  . Pre-ulcerative calluses 02/27/2018  . 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 Arthritis 03/13/2017  . Bilateral carpal tunnel syndrome 03/13/2017  . Clumsiness 03/13/2017  . Right elbow pain 08/29/2016  . History of cholecystectomy 02/22/2012    Past Surgical History:  Procedure Laterality Date  . CHOLECYSTECTOMY  02/03/2012   Procedure: LAPAROSCOPIC CHOLECYSTECTOMY WITH INTRAOPERATIVE CHOLANGIOGRAM;  Surgeon: Shann Medal, MD;  Location: [facility] ORS;  Service: General;  Laterality: N/A;  . COLONOSCOPY W/ POLYPECTOMY    . KIDNEY STONE SURGERY  90s   some i passed, some they blasted, some they went in to get   . TONSILLECTOMY    . TOTAL KNEE ARTHROPLASTY Right 06/16/2019   Procedure: TOTAL KNEE ARTHROPLASTY;  Surgeon: Vickey Huger, MD;  Location: [facility];  Service: Orthopedics;  Laterality: Right;       No family history on file.  Social History   Tobacco Use  . Smoking status: Never Smoker  . Smokeless tobacco: Never Used  Substance Use Topics  . Alcohol use: No  . Drug use: No    Home Medications Prior to Admission medications   Medication Sig Start Date End Date Taking? Authorizing Provider  acetaminophen (TYLENOL) 650 MG CR tablet Take 1,300 mg by mouth every 8 (eight) hours as needed for pain.   Yes [provider]  Ascorbic Acid (VITA-C PO) Take 1 tablet by mouth daily.   Yes [provider]  aspirin EC 81 MG tablet Take 81 mg by mouth every other day.   Yes [provider]  losartan-hydrochlorothiazide (HYZAAR) 100-25 MG tablet Take 0.5 tablets by mouth daily.  12/14/17  Yes [provider]  metFORMIN (GLUCOPHAGE) 500 MG tablet Take 500 mg by mouth 2 (two) times daily with a meal.  01/31/19  Yes [provider]  Multiple Vitamins-Minerals (ZINC PO) Take 1 tablet by mouth daily.   Yes [provider]  predniSONE (STERAPRED UNI-PAK 21 TAB) 5 MG  (21) TBPK tablet Take 5 mg by mouth See admin instructions. 12/18/19  Yes [provider]  amoxicillin-clavulanate (AUGMENTIN) 875-125 MG tablet Take 1 tablet by mouth 2 (two) times daily. Patient not taking: Reported on 12/25/2019 07/09/19   Landis Martins, DPM  aspirin EC 325 MG EC tablet Take 1 tablet (325 mg total) by mouth 2 (two) times daily. Patient not taking: Reported on 12/26/2019 06/17/19   Donia Ast, PA  ciprofloxacin (CIPRO) 250 MG tablet Take 1 tablet (250 mg total) by mouth 2 (two) times daily. Patient not taking: Reported on 12/17/2019 07/07/19   Evelina Bucy, DPM  clindamycin (CLEOCIN) 300 MG capsule Take 1 capsule (300 mg total) by mouth 2 (two) times daily. Patient not taking: Reported on 12/13/2019 07/07/19   Evelina Bucy, DPM  methocarbamol (ROBAXIN) 500 MG tablet Take 1-2 tablets (500-1,000 mg total) by mouth every 6 (six) hours as needed for muscle spasms. Patient not taking: Reported on 12/29/2019 06/17/19   Donia Ast, PA  oxyCODONE (OXY IR/ROXICODONE) 5 MG immediate release tablet Take 1-2 tablets (5-10 mg total) by mouth every 6 (six) hours as needed for moderate pain (pain score 4-6). Patient not taking: Reported on 01/10/2020 06/17/19   Donia Ast, PA    Allergies    Lisinopril  Review of Systems   Review of Systems  Constitutional: Positive for chills and fatigue. Negative for fever.  HENT: Negative for ear pain and sore throat.   Eyes: Negative for pain and visual disturbance.  Respiratory: Positive for cough and shortness of breath.   Cardiovascular: Negative for chest pain and palpitations.  Gastrointestinal: Negative for abdominal pain and vomiting.  Genitourinary: Negative for dysuria and hematuria.  Musculoskeletal: Negative for arthralgias and back pain.  Skin: Negative for color change and rash.  Neurological: Negative for seizures and syncope.  All other systems reviewed and are negative.   Physical Exam Updated  Vital Signs BP 114/61   Pulse (!) 101   Temp 100.1 F (37.8 C) (Oral)   Resp 20   Ht 5\' 8"  (1.727 m)   Wt 77.1 kg   SpO2 98%   BMI 25.85 kg/m   Physical Exam Vitals and nursing note reviewed.  Constitutional:      Appearance: He is well-developed.  HENT:     Head: Normocephalic and atraumatic.  Eyes:     Conjunctiva/sclera: Conjunctivae normal.  Cardiovascular:     Rate and Rhythm: Regular rhythm. Tachycardia present.     Heart sounds: No murmur.  Pulmonary:     Effort: Pulmonary effort is normal. No accessory muscle usage.     Comments: Somewhat coarse breath sounds bilaterally, no respiratory distress, 3 L nasal cannula Abdominal:     Palpations: Abdomen is soft.     Tenderness: There is no abdominal tenderness.  Musculoskeletal:     Cervical back:  Neck supple.  Skin:    General: Skin is warm and dry.  Neurological:     General: No focal deficit present.     Mental Status: He is alert.     ED Results / Procedures / Treatments   Labs (all labs ordered are listed, but only abnormal results are displayed) Labs Reviewed  BASIC METABOLIC PANEL - Abnormal; Notable for the following components:      Result Value   Sodium 133 (*)    Chloride 95 (*)    Glucose, Bld 208 (*)    BUN 56 (*)    Creatinine, Ser 2.26 (*)    GFR calc non Af Amer 27 (*)    GFR calc Af Amer 31 (*)    All other components within normal limits  CBC - Abnormal; Notable for the following components:   WBC 15.9 (*)    All other components within normal limits  BRAIN NATRIURETIC PEPTIDE - Abnormal; Notable for the following components:   B Natriuretic Peptide 104.8 (*)    All other components within normal limits  LACTIC ACID, PLASMA - Abnormal; Notable for the following components:   Lactic Acid, Venous 2.8 (*)    All other components within normal limits  D-DIMER, QUANTITATIVE (NOT AT [facility]) - Abnormal; Notable for the following components:   D-Dimer, Quant 1.88 (*)    All other components  within normal limits  FIBRINOGEN - Abnormal; Notable for the following components:   Fibrinogen 682 (*)    All other components within normal limits  C-REACTIVE PROTEIN - Abnormal; Notable for the following components:   CRP 21.1 (*)    All other components within normal limits  LACTATE DEHYDROGENASE - Abnormal; Notable for the following components:   LDH 312 (*)    All other components within normal limits  POC SARS CORONAVIRUS 2 AG -  ED - Abnormal; Notable for the following components:   SARS Coronavirus 2 Ag POSITIVE (*)    All other components within normal limits  TROPONIN I (HIGH SENSITIVITY) - Abnormal; Notable for the following components:   Troponin I (High Sensitivity) 45 (*)    All other components within normal limits  TROPONIN I (HIGH SENSITIVITY) - Abnormal; Notable for the following components:   Troponin I (High Sensitivity) 47 (*)    All other components within normal limits  URINALYSIS, ROUTINE W REFLEX MICROSCOPIC  LACTIC ACID, PLASMA  PROCALCITONIN  ABO/RH    EKG EKG Interpretation  Date/Time:  Monday December 22 2019 10:08:23 EST Ventricular Rate:  125 PR Interval:    QRS Duration: 82 QT Interval:  291 QTC Calculation: 420 R Axis:   -53 Text Interpretation: Sinus tachycardia Left anterior fascicular block Consider anterior infarct Confirmed by Madalyn Rob [phone]) on 01/01/2020 10:23:18 AM Also confirmed by Madalyn Rob [phone]), editor Hattie Perch ([phone])  on 12/14/2019 10:37:02 AM   Radiology DG Chest Portable 1 View  Result Date: 01/01/2020 CLINICAL DATA:  Cough and shortness of breath. EXAM: PORTABLE CHEST 1 VIEW COMPARISON:  Chest x-ray dated January 08, 2019. FINDINGS: The heart size and mediastinal contours are within normal limits. Normal pulmonary vascularity. Patchy opacities at the left lung base. No pleural effusion or pneumothorax. No acute osseous abnormality. IMPRESSION: 1. Left lower lobe pneumonia. Electronically Signed   By:  Titus Dubin M.D.   On: 12/23/2019 10:41    Procedures .Critical Care Performed by: Lucrezia Starch, MD Authorized by: Lucrezia Starch, MD   Critical care provider statement:  Critical care time (minutes):  45   Critical care was necessary to treat or prevent imminent or life-threatening deterioration of the following conditions:  Respiratory failure   Critical care was time spent personally by me on the following activities:  Discussions with consultants, evaluation of patient's response to treatment, examination of patient, ordering and performing treatments and interventions, ordering and review of laboratory studies, ordering and review of radiographic studies, pulse oximetry, re-evaluation of patient's condition, obtaining history from patient or surrogate and review of old charts   (including critical care time)  Medications Ordered in ED Medications  enoxaparin (LOVENOX) injection 40 mg (has no administration in time range)  ascorbic acid (VITAMIN C) tablet 500 mg (has no administration in time range)  zinc sulfate capsule 220 mg (has no administration in time range)  guaiFENesin-dextromethorphan (ROBITUSSIN DM) 100-10 MG/5ML syrup 10 mL (has no administration in time range)  chlorpheniramine-HYDROcodone (TUSSIONEX) 10-8 MG/5ML suspension 5 mL (has no administration in time range)  remdesivir 200 mg in sodium chloride 0.9% 250 mL IVPB (has no administration in time range)    Followed by  remdesivir 100 mg in sodium chloride 0.9 % 100 mL IVPB (has no administration in time range)  dexamethasone (DECADRON) injection 6 mg (has no administration in time range)  Ipratropium-Albuterol (COMBIVENT) respimat 2 puff (has no administration in time range)  dexamethasone (DECADRON) injection 6 mg (6 mg Intravenous Given 01/02/2020 1222)  lactated ringers bolus 1,000 mL (1,000 mLs Intravenous New Bag/Given (Non-Interop) 01/06/2020 1317)    ED Course  I have reviewed the triage vital  signs and the nursing notes.  Pertinent labs & imaging results that were available during my care of the patient were reviewed by me and considered in my medical decision making (see chart for details).  Clinical Course as of Dec 21 1321  Mon Dec 22, 2019  1122 SARS Coronavirus 2 Ag(!): POSITIVE [RD]  1122 COVID pos, will admit once labs are back, start start steroids   [RD]  1236 Reviewed remainder of labs, will admit hospitalist   [RD]    Clinical Course User Index [RD] Lucrezia Starch, MD   MDM Rules/Calculators/A&P                      79 year old male presents to ER with steadily worsening cough, shortness of breath.  On exam he is actually well-appearing, not in any respiratory distress however noted some tachycardia as well as hypoxia.  Suspect related to COVID-19.  Rapid Covid positive, will start steroids, admit to hospitalist for further admission.  Obtained Covid admission labs. High sensitivity trop mildly elevated, no chest pain, no acute ischemic ekg changes, will trend, doubt ACS.  Dr. Olevia Bowens to admit to hospital service.  Terrence Cisneros was evaluated in Emergency Department on 12/21/2019 for the symptoms described in the history of present illness. He was evaluated in the context of the global COVID-19 pandemic, which necessitated consideration that the patient might be at risk for infection with the SARS-CoV-2 virus that causes COVID-19. Institutional protocols and algorithms that pertain to the evaluation of patients at risk for COVID-19 are in a state of rapid change based on information released by regulatory bodies including the CDC and federal and state organizations. These policies and algorithms were followed during the patient's care in the ED.   Final Clinical Impression(s) / ED Diagnoses Final diagnoses:  Acute respiratory failure with hypoxia ([place])  COVID-19  Viral pneumonia    Rx /  DC Orders ED Discharge Orders    None       Lucrezia Starch,  MD 12/30/2019 1324

## 2019-12-22 NOTE — ED Triage Notes (Addendum)
Pt states last Thursday he started to feel like he could not eat due to nausea, SHOB "coming on all week kinda" 86 % RA, Cough present. Throat dry, started on Prednisone Thursday

## 2019-12-22 NOTE — H&P (Signed)
History and Physical    Terrence Cisneros [id number] DOB: March 12, 1941 DOA: 12/23/2019  PCP: [name], MD   Patient coming from: Home.  I have personally briefly reviewed patient's old medical records in Terrence Cisneros  Chief Complaint: Shortness of breath.  HPI: Terrence Cisneros is a 79 y.o. male with medical history significant of acquired hypothyroidism, osteoarthritis, radiculopathy, chronic kidney disease, environmental allergies, hypertension, neuropathy, type 2 diabetes mellitus who is coming to the emergency department due to viral symptoms associated with progressively worse dyspnea for about 10 days and history of exposure to COVID-19 around Christmas with her causing the later tested positive for SARS 2 coronavirus.  He was taking amoxicillin at home without any changes in his symptoms.  He denies fever, chills, but complains of sore throat, occasionally productive cough and dyspnea.  No chest pain, palpitations, dizziness, abdominal pain, nausea, vomiting, melena or hematochezia.  No dysuria, frequency or hematuria.  No polyuria, polydipsia, polyphagia or blurred vision.  ED Course: Patient was given 6 mg of dexamethasone IVP.  I added 1000 mL of LR bolus.  WBC was 15.9, hemoglobin 13.1 g/dL and platelets 273.  Fibrinogen was 682, D-dimer 1.88, LDH 312, CRP 21.1 and procalcitonin 37.82.  Sodium 133, potassium 3.6, chloride 95 and CO2 23 mmol/L.  Glucose 208, BUN 56 and creatinine 2.26 mg/dL.  Troponin was 45 and 47 ng/L.  Lactic acid was 2.8 and then 2.3 mmol/L.  BNP was 104.8 pg/mL.  Review of Systems: As per HPI otherwise 10 point review of systems negative.   Past Medical History:  Diagnosis Date  . Acquired hypothyroidism   . Arthritis   . Cervical radiculopathy    "fingers get cold and my neck pops when i turn it dide to side, theyre going to send me for a nerve induction next month"  . Chronic kidney disease    "they told me a couple years ago  to avoid taking  ibuprofen because my kidneys werent 100%"  . Environmental allergies   . Hypertension   . Neuropathy    "ive got numbness in my left foot "   . Type 2 diabetes mellitus (Terrence Cisneros)     Past Surgical History:  Procedure Laterality Date  . CHOLECYSTECTOMY  02/03/2012   Procedure: LAPAROSCOPIC CHOLECYSTECTOMY WITH INTRAOPERATIVE CHOLANGIOGRAM;  Surgeon: Shann Medal, MD;  Location: [facility];  Service: General;  Laterality: N/A;  . COLONOSCOPY W/ POLYPECTOMY    . KIDNEY STONE SURGERY  90s   some i passed, some they blasted, some they went in to get   . TONSILLECTOMY    . TOTAL KNEE ARTHROPLASTY Right 06/16/2019   Procedure: TOTAL KNEE ARTHROPLASTY;  Surgeon: Vickey Huger, MD;  Location: [facility];  Service: Orthopedics;  Laterality: Right;     reports that he has never smoked. He has never used smokeless tobacco. He reports that he does not drink alcohol or use drugs.  Allergies  Allergen Reactions  . Lisinopril Other (See Comments)    BP dropped very low   Mother Hypertension  Prior to Admission medications   Medication Sig Start Date End Date Taking? Authorizing Provider  acetaminophen (TYLENOL) 650 MG CR tablet Take 1,300 mg by mouth every 8 (eight) hours as needed for pain.   Yes [provider]  Ascorbic Acid (VITA-C PO) Take 1 tablet by mouth daily.   Yes [provider]  aspirin EC 81 MG tablet Take 81 mg by mouth every other day.   Yes  [provider]  losartan-hydrochlorothiazide (HYZAAR) 100-25 MG tablet Take 0.5 tablets by mouth daily.  12/14/17  Yes [provider]  metFORMIN (GLUCOPHAGE) 500 MG tablet Take 500 mg by mouth 2 (two) times daily with a meal.  01/31/19  Yes [provider]  Multiple Vitamins-Minerals (ZINC PO) Take 1 tablet by mouth daily.   Yes [provider]  predniSONE (STERAPRED UNI-PAK 21 TAB) 5 MG (21) TBPK tablet Take 5 mg by mouth See admin instructions. 12/18/19  Yes [provider]    amoxicillin-clavulanate (AUGMENTIN) 875-125 MG tablet Take 1 tablet by mouth 2 (two) times daily. Patient not taking: Reported on 01/09/2020 07/09/19   Landis Martins, DPM  aspirin EC 325 MG EC tablet Take 1 tablet (325 mg total) by mouth 2 (two) times daily. Patient not taking: Reported on 01/05/2020 06/17/19   Donia Ast, PA  ciprofloxacin (CIPRO) 250 MG tablet Take 1 tablet (250 mg total) by mouth 2 (two) times daily. Patient not taking: Reported on 12/31/2019 07/07/19   Evelina Bucy, DPM  clindamycin (CLEOCIN) 300 MG capsule Take 1 capsule (300 mg total) by mouth 2 (two) times daily. Patient not taking: Reported on 12/13/2019 07/07/19   Evelina Bucy, DPM  methocarbamol (ROBAXIN) 500 MG tablet Take 1-2 tablets (500-1,000 mg total) by mouth every 6 (six) hours as needed for muscle spasms. Patient not taking: Reported on 01/03/2020 06/17/19   Donia Ast, PA  oxyCODONE (OXY IR/ROXICODONE) 5 MG immediate release tablet Take 1-2 tablets (5-10 mg total) by mouth every 6 (six) hours as needed for moderate pain (pain score 4-6). Patient not taking: Reported on 01/04/2020 06/17/19   Donia Ast, [place]    Physical Exam: Vitals:   01/06/2020 1001 12/31/2019 1218 01/09/2020 1230 01/03/2020 1300  BP:  119/70 113/83 114/61  Pulse:  (!) 113 (!) 104 (!) 101  Resp:  (!) 24 (!) 22 20  Temp:      TempSrc:      SpO2: 90% 95% 94% 98%  Weight:      Height:        Constitutional: NAD, calm, comfortable Eyes: PERRL, lids and conjunctivae normal ENMT: Mucous membranes are moist. Posterior pharynx clear of any exudate or lesions. Neck: normal, supple, no masses, no thyromegaly Respiratory: Mildly tachypneic in the low to mid 20s. No accessory muscle use.  Bilateral scattered crackles.  No wheezing.  No rhonchi.  Cardiovascular: Regular rate and rhythm, no murmurs / rubs / gallops. No extremity edema. 2+ pedal pulses. No carotid bruits.  Abdomen: Nondistended.  Soft, no tenderness, no masses  palpated. No hepatosplenomegaly. Bowel sounds positive.  Musculoskeletal: no clubbing / cyanosis. Good ROM, no contractures. Normal muscle tone.  Skin: no rashes, lesions, ulcers on limited dermatological examination. Neurologic: CN 2-12 grossly intact. Sensation intact, DTR normal. Strength 5/5 in all 4.  Psychiatric: Alert and oriented x 2.  Mildly anxious mood.   Labs on Admission: I have personally reviewed following labs and imaging studies  CBC: Recent Labs  Lab 12/25/2019 1006  WBC 15.9*  HGB 13.1  HCT 39.2  MCV 88.5  PLT 123456   Basic Metabolic Panel: Recent Labs  Lab 12/31/2019 1006  NA 133*  K 3.6  CL 95*  CO2 23  GLUCOSE 208*  BUN 56*  CREATININE 2.26*  CALCIUM 9.2   GFR: Estimated Creatinine Clearance: 26.1 mL/min (A) (by C-G formula based on SCr of 2.26 mg/dL (H)). Liver Function Tests: No results for input(s): AST, ALT,  ALKPHOS, BILITOT, PROT, ALBUMIN in the last 168 hours. No results for input(s): LIPASE, AMYLASE in the last 168 hours. No results for input(s): AMMONIA in the last 168 hours. Coagulation Profile: No results for input(s): INR, PROTIME in the last 168 hours. Cardiac Enzymes: No results for input(s): CKTOTAL, CKMB, CKMBINDEX, TROPONINI in the last 168 hours. BNP (last 3 results) No results for input(s): PROBNP in the last 8760 hours. HbA1C: No results for input(s): HGBA1C in the last 72 hours. CBG: No results for input(s): GLUCAP in the last 168 hours. Lipid Profile: No results for input(s): CHOL, HDL, LDLCALC, TRIG, CHOLHDL, LDLDIRECT in the last 72 hours. Thyroid Function Tests: No results for input(s): TSH, T4TOTAL, FREET4, T3FREE, THYROIDAB in the last 72 hours. Anemia Panel: No results for input(s): VITAMINB12, FOLATE, FERRITIN, TIBC, IRON, RETICCTPCT in the last 72 hours. Urine analysis:    Component Value Date/Time   COLORURINE YELLOW 02/02/2012 2341   APPEARANCEUR CLEAR 02/02/2012 2341   LABSPEC 1.030 02/02/2012 2341   PHURINE  6.0 02/02/2012 2341   GLUCOSEU NEGATIVE 02/02/2012 2341   HGBUR TRACE (A) 02/02/2012 2341   BILIRUBINUR NEGATIVE 02/02/2012 2341   KETONESUR NEGATIVE 02/02/2012 2341   PROTEINUR 30 (A) 02/02/2012 2341   UROBILINOGEN 0.2 02/02/2012 2341   NITRITE NEGATIVE 02/02/2012 2341   LEUKOCYTESUR NEGATIVE 02/02/2012 2341    Radiological Exams on Admission: DG Chest Portable 1 View  Result Date: 12/12/2019 CLINICAL DATA:  Cough and shortness of breath. EXAM: PORTABLE CHEST 1 VIEW COMPARISON:  Chest x-ray dated January 08, 2019. FINDINGS: The heart size and mediastinal contours are within normal limits. Normal pulmonary vascularity. Patchy opacities at the left lung base. No pleural effusion or pneumothorax. No acute osseous abnormality. IMPRESSION: 1. Left lower lobe pneumonia. Electronically Signed   By: Titus Dubin M.D.   On: 12/21/2019 10:41    EKG: Independently reviewed.  Vent. rate 125 BPM PR interval * ms QRS duration 82 ms QT/QTc 291/420 ms P-R-T axes 100 -53 55 Sinus tachycardia Left anterior fascicular block Consider anterior infarct  Assessment/Plan Principal Problem:   Pneumonia due to COVID-19 virus Admit to telemetry/inpatient. Continue supplemental oxygen.  As needed and scheduled bronchodilators.  Incentive spirometry as tolerated.  Prone positioning as tolerated. Acetaminophen as needed.  Antitussives as needed.  Remdesivir per pharmacy.  Dexamethasone 6 mg IV daily.  Tocilizumab if no contraindications.  Active Problems:   Type 2 diabetes mellitus with hyperglycemia, without long-term current use of insulin ([facility]) Carbohydrate modified diet. Continue time-limited IV hydration. COVID-19 glycemic control protocol.    Essential hypertension Hold Hyzaar due to renal function. Monitor BP, renal function electrolytes.    AKI (acute kidney injury) ([place]) Hold losartan. Hold hydrochlorothiazide.   Continue IV fluids. Monitor intake and output. Follow-up renal  function electrolytes.    Lactic acidosis Trending down. Continue IV hydration.    DVT prophylaxis: Lovenox SQ. Code Status: Full code. Family Communication:  Disposition Plan: Admit for COVID-19 pneumonia treatment. Consults called:  Admission status: Inpatient/   Reubin Milan MD [facility]  If 7PM-7AM, please contact night-coverage [email or web address]  12/30/2019, 1:09 PM   This document was prepared using Dragon voice recognition software and may contain some unintended transcription errors.

## 2019-12-23 ENCOUNTER — Inpatient Hospital Stay (HOSPITAL_COMMUNITY): Payer: PPO

## 2019-12-23 ENCOUNTER — Encounter (HOSPITAL_COMMUNITY): Payer: Self-pay | Admitting: Radiology

## 2019-12-23 LAB — COMPREHENSIVE METABOLIC PANEL
ALT: 61 U/L — ABNORMAL HIGH (ref 0–44)
AST: 47 U/L — ABNORMAL HIGH (ref 15–41)
Albumin: 2.9 g/dL — ABNORMAL LOW (ref 3.5–5.0)
Alkaline Phosphatase: 59 U/L (ref 38–126)
Anion gap: 13 (ref 5–15)
BUN: 55 mg/dL — ABNORMAL HIGH (ref 8–23)
CO2: 23 mmol/L (ref 22–32)
Calcium: 8.4 mg/dL — ABNORMAL LOW (ref 8.9–10.3)
Chloride: 98 mmol/L (ref 98–111)
Creatinine, Ser: 1.83 mg/dL — ABNORMAL HIGH (ref 0.61–1.24)
GFR calc Af Amer: 40 mL/min — ABNORMAL LOW (ref >60)
GFR calc non Af Amer: 35 mL/min — ABNORMAL LOW (ref >60)
Glucose, Bld: 209 mg/dL — ABNORMAL HIGH (ref 70–99)
Potassium: 3.4 mmol/L — ABNORMAL LOW (ref 3.5–5.1)
Sodium: 134 mmol/L — ABNORMAL LOW (ref 135–145)
Total Bilirubin: 0.1 mg/dL — ABNORMAL LOW (ref 0.3–1.2)
Total Protein: 6.7 g/dL (ref 6.5–8.1)

## 2019-12-23 LAB — BLOOD GAS, ARTERIAL
Acid-Base Excess: 0.1 mmol/L (ref 0.0–2.0)
Bicarbonate: 22.3 mmol/L (ref 20.0–28.0)
Drawn by: 25788
FIO2: 40
O2 Saturation: 81.6 %
Patient temperature: 98.6
pCO2 arterial: 30.8 mmHg — ABNORMAL LOW (ref 32.0–48.0)
pH, Arterial: 7.473 — ABNORMAL HIGH (ref 7.350–7.450)
pO2, Arterial: 44.7 mmHg — ABNORMAL LOW (ref 83.0–108.0)

## 2019-12-23 LAB — CBC WITH DIFFERENTIAL/PLATELET
Abs Immature Granulocytes: 0.13 10*3/uL — ABNORMAL HIGH (ref 0.00–0.07)
Basophils Absolute: 0 10*3/uL (ref 0.0–0.1)
Basophils Relative: 0 %
Eosinophils Absolute: 0 10*3/uL (ref 0.0–0.5)
Eosinophils Relative: 0 %
HCT: 36.9 % — ABNORMAL LOW (ref 39.0–52.0)
Hemoglobin: 12.2 g/dL — ABNORMAL LOW (ref 13.0–17.0)
Immature Granulocytes: 1 %
Lymphocytes Relative: 6 %
Lymphs Abs: 0.8 10*3/uL (ref 0.7–4.0)
MCH: 29.3 pg (ref 26.0–34.0)
MCHC: 33.1 g/dL (ref 30.0–36.0)
MCV: 88.7 fL (ref 80.0–100.0)
Monocytes Absolute: 0.8 10*3/uL (ref 0.1–1.0)
Monocytes Relative: 6 %
Neutro Abs: 11.4 10*3/uL — ABNORMAL HIGH (ref 1.7–7.7)
Neutrophils Relative %: 87 %
Platelets: 248 10*3/uL (ref 150–400)
RBC: 4.16 MIL/uL — ABNORMAL LOW (ref 4.22–5.81)
RDW: 13.9 % (ref 11.5–15.5)
WBC: 13.1 10*3/uL — ABNORMAL HIGH (ref 4.0–10.5)
nRBC: 0 % (ref 0.0–0.2)

## 2019-12-23 LAB — MAGNESIUM: Magnesium: 2.2 mg/dL (ref 1.7–2.4)

## 2019-12-23 LAB — C-REACTIVE PROTEIN: CRP: 23 mg/dL — ABNORMAL HIGH (ref <1.0)

## 2019-12-23 LAB — PHOSPHORUS: Phosphorus: 2.9 mg/dL (ref 2.5–4.6)

## 2019-12-23 LAB — HEMOGLOBIN A1C
Hgb A1c MFr Bld: 7.4 % — ABNORMAL HIGH (ref 4.8–5.6)
Mean Plasma Glucose: 165.68 mg/dL

## 2019-12-23 LAB — GLUCOSE, CAPILLARY
Glucose-Capillary: 155 mg/dL — ABNORMAL HIGH (ref 70–99)
Glucose-Capillary: 133 mg/dL — ABNORMAL HIGH (ref 70–99)

## 2019-12-23 LAB — FERRITIN: Ferritin: 678 ng/mL — ABNORMAL HIGH (ref 24–336)

## 2019-12-23 LAB — D-DIMER, QUANTITATIVE: D-Dimer, Quant: 1.22 ug/mL-FEU — ABNORMAL HIGH (ref 0.00–0.50)

## 2019-12-23 MED ORDER — SODIUM CHLORIDE (PF) 0.9 % IJ SOLN
INTRAMUSCULAR | Status: AC
  Filled 2019-12-23: qty 50

## 2019-12-23 MED ORDER — TOCILIZUMAB 400 MG/20ML IV SOLN: 8.0000 mg/kg | Freq: Once | INTRAVENOUS | Status: DC

## 2019-12-23 MED ORDER — IOHEXOL 350 MG/ML SOLN
75.0000 mL | Freq: Once | INTRAVENOUS | Status: AC | PRN
  Administered 2019-12-23: 75 mL via INTRAVENOUS

## 2019-12-23 MED ORDER — LINAGLIPTIN 5 MG PO TABS
5.0000 mg | ORAL_TABLET | Freq: Every day | ORAL | Status: DC
  Administered 2019-12-23 – 2019-12-31 (×9): 5 mg via ORAL
  Filled 2019-12-23 (×9): qty 1

## 2019-12-23 MED ORDER — INSULIN ASPART 100 UNIT/ML ~~LOC~~ SOLN
0.0000 [IU] | Freq: Three times a day (TID) | SUBCUTANEOUS | Status: DC
  Administered 2019-12-23: 2 [IU] via SUBCUTANEOUS
  Administered 2019-12-23: 3 [IU] via SUBCUTANEOUS
  Administered 2019-12-24 (×2): 8 [IU] via SUBCUTANEOUS
  Administered 2019-12-24: 3 [IU] via SUBCUTANEOUS
  Administered 2019-12-25: 08:00:00 8 [IU] via SUBCUTANEOUS
  Administered 2019-12-25: 18:00:00 15 [IU] via SUBCUTANEOUS
  Administered 2019-12-25: 8 [IU] via SUBCUTANEOUS
  Administered 2019-12-26: 18:00:00 11 [IU] via SUBCUTANEOUS
  Administered 2019-12-26 – 2019-12-27 (×4): 3 [IU] via SUBCUTANEOUS
  Administered 2019-12-27: 13:00:00 8 [IU] via SUBCUTANEOUS
  Administered 2019-12-28 (×2): 5 [IU] via SUBCUTANEOUS

## 2019-12-23 MED ORDER — INSULIN ASPART 100 UNIT/ML ~~LOC~~ SOLN
0.0000 [IU] | Freq: Every day | SUBCUTANEOUS | Status: DC
  Administered 2019-12-24: 3 [IU] via SUBCUTANEOUS
  Administered 2019-12-25: 4 [IU] via SUBCUTANEOUS
  Administered 2019-12-26: 3 [IU] via SUBCUTANEOUS
  Administered 2019-12-27 – 2019-12-30 (×2): 2 [IU] via SUBCUTANEOUS
  Administered 2019-12-31: 3 [IU] via SUBCUTANEOUS

## 2019-12-23 MED ORDER — SODIUM CHLORIDE 0.9 % IV SOLN: INTRAVENOUS | Status: AC

## 2019-12-23 NOTE — Progress Notes (Signed)
PROGRESS NOTE    Terrence Cisneros  [id number] DOB: 04/23/1941 DOA: 12/19/2019 PCP: [name], MD )   Brief Narrative:  HPI: Terrence Cisneros is a 79 y.o. male with medical history significant of acquired hypothyroidism, osteoarthritis, radiculopathy, chronic kidney disease, environmental allergies, hypertension, neuropathy, type 2 diabetes mellitus who is coming to the emergency department due to viral symptoms associated with progressively worse dyspnea for about 10 days and history of exposure to COVID-19 around Christmas with her causing the later tested positive for SARS 2 coronavirus.  He was taking amoxicillin at home without any changes in his symptoms.  He denies fever, chills, but complains of sore throat, occasionally productive cough and dyspnea.  No chest pain, palpitations, dizziness, abdominal pain, nausea, vomiting, melena or hematochezia.  No dysuria, frequency or hematuria.  No polyuria, polydipsia, polyphagia or blurred vision.  ED Course: Patient was given 6 mg of dexamethasone IVP.  I added 1000 mL of LR bolus.  WBC was 15.9, hemoglobin 13.1 g/dL and platelets 273.  Fibrinogen was 682, D-dimer 1.88, LDH 312, CRP 21.1 and procalcitonin 37.82.  Sodium 133, potassium 3.6, chloride 95 and CO2 23 mmol/L.  Glucose 208, BUN 56 and creatinine 2.26 mg/dL.  Troponin was 45 and 47 ng/L.  Lactic acid was 2.8 and then 2.3 mmol/L.  BNP was 104.8 pg/mL.   Assessment & Plan:   Principal Problem:   Pneumonia due to COVID-19 virus Active Problems:   Essential hypertension   Type 2 diabetes mellitus with hyperglycemia, without long-term current use of insulin (HCC)   AKI (acute kidney injury) ([place])   Lactic acidosis  # acute respiratory failure with hypoxemia secondary to  COVID-19 pneumonia Plan remdesivir Decadron may consider Actemra Not sure if it is a contraindication for elevated procalcitonin and suspicion of sepsis secondary to left lower pneumonia as well Has saturation  of 95% on 6 L nasal cannula. His ABG showed PO2 of 41 with saturation 81% on 40% FiO2 Plan to titrate FiO2 keep PO2 more than 70 and saturation between 90-94 I see patient is a potential transfer to [facility] how much is possible 3 4 hours every shift, incentive spirometry, dexamethasone 6 mg IV daily for 10 days remdesivir per pharmacy Elevated inflammatory markers Bad prognostic factors low albumin, renal failure  # Pneumonia left lower lobe infiltrate on chest x-ray Rule out community-acquired pneumonia on top of Covid Procalcitonin was high, lactic acid high Plan Rocephin and Zithromax  Type 2 diabetes mellitus with hyperglycemia, without long-term current use of insulin (HCC) Carbohydrate modified diet. Continue time-limited IV hydration. COVID-19 glycemic control protocol.    Essential hypertension Hold Hyzaar due to renal function. Monitor BP, renal function electrolytes.    AKI (acute kidney injury) ([place]) Hold losartan. Hold hydrochlorothiazide.   Continue IV fluids. Monitor intake and output. Follow-up renal function electrolytes.    Lactic acidosis Trending down. Continue IV hydration   DVT prophylaxis: Lovenox Code Status: Full code Family Communication: Disposition Plan:.   Consultants:   None   Procedures: None I   Antimicrobials: (  Rocephin and Zithromax day 2  Subjective: Moderate respiratory distress Was tachypneic no respiratory rate normal ABG on 6 L nasal cannula PO2 was 41, FiO2 40% CO2 normal Positive markers for inflammation Prognostic factors  Objective: Vitals:   01/09/2020 2100 01/03/2020 2131 12/23/19 0504 12/23/19 1141  BP:  116/65 110/64 118/69  Pulse:  85 81 100  Resp:  (!) 24  18  Temp:  97.8 F (36.6 C) 97.6 F (36.4 C) 98.8 F (37.1 C)  TempSrc:  Oral Axillary Oral  SpO2:  (!) 88% 92% 95%  Weight: 72.2 kg     Height: 5\' 8"  (1.727 m)       Intake/Output Summary (Last 24 hours) at 12/23/2019  1419 Last data filed at 12/23/2019 1300 Gross per 24 hour  Intake 1664.85 ml  Output 6 ml  Net 1658.85 ml   Filed Weights   12/31/2019 0956 12/16/2019 2100  Weight: 77.1 kg 72.2 kg    Examination:  General exam: Appears calm and comfortable  Respiratory system: Tachypnea. Cardiovascular system: S1 & S2 heard, RRR. No JVD, murmurs, rubs, gallops or clicks. No pedal edema. Gastrointestinal system: Abdomen is nondistended, soft and nontender. No organomegaly or masses felt. Normal bowel sounds heard. Central nervous system: Alert and oriented. No focal neurological deficits. Extremities: Symmetric 5 x 5 power. Skin: No rashes, lesions or ulcers Psychiatry: Judgement and insight appear normal. Mood & affect appropriate.     Data Reviewed: I have personally reviewed following labs and imaging studies  CBC: Recent Labs  Lab 12/21/2019 1006 12/23/19 0352  WBC 15.9* 13.1*  NEUTROABS  --  11.4*  HGB 13.1 12.2*  HCT 39.2 36.9*  MCV 88.5 88.7  PLT 273 [id number]   Basic Metabolic Panel: Recent Labs  Lab 12/28/2019 1006 12/23/19 0352  NA 133* 134*  K 3.6 3.4*  CL 95* 98  CO2 23 23  GLUCOSE 208* 209*  BUN 56* 55*  CREATININE 2.26* 1.83*  CALCIUM 9.2 8.4*  MG  --  2.2  PHOS  --  2.9   GFR: Estimated Creatinine Clearance: 32.2 mL/min (A) (by C-G formula based on SCr of 1.83 mg/dL (H)). Liver Function Tests: Recent Labs  Lab 12/23/19 0352  AST 47*  ALT 61*  ALKPHOS 59  BILITOT 0.1*  PROT 6.7  ALBUMIN 2.9*   No results for input(s): LIPASE, AMYLASE in the last 168 hours. No results for input(s): AMMONIA in the last 168 hours. Coagulation Profile: No results for input(s): INR, PROTIME in the last 168 hours. Cardiac Enzymes: No results for input(s): CKTOTAL, CKMB, CKMBINDEX, TROPONINI in the last 168 hours. BNP (last 3 results) No results for input(s): PROBNP in the last 8760 hours. HbA1C: Recent Labs    12/23/19 1105  HGBA1C 7.4*   CBG: Recent Labs  Lab 12/23/19  1218  GLUCAP 155*   Lipid Profile: No results for input(s): CHOL, HDL, LDLCALC, TRIG, CHOLHDL, LDLDIRECT in the last 72 hours. Thyroid Function Tests: No results for input(s): TSH, T4TOTAL, FREET4, T3FREE, THYROIDAB in the last 72 hours. Anemia Panel: Recent Labs    12/23/19 0352  FERRITIN 678*   Sepsis Labs: Recent Labs  Lab 01/06/2020 1100 01/06/2020 1214 12/12/2019 1400  PROCALCITON  --  37.82  --   LATICACIDVEN 2.8*  --  2.3*    No results found for this or any previous visit (from the past 240 hour(s)).       Radiology Studies: DG Chest Portable 1 View  Result Date: 01/11/2020 CLINICAL DATA:  Cough and shortness of breath. EXAM: PORTABLE CHEST 1 VIEW COMPARISON:  Chest x-ray dated January 08, 2019. FINDINGS: The heart size and mediastinal contours are within normal limits. Normal pulmonary vascularity. Patchy opacities at the left lung base. No pleural effusion or pneumothorax. No acute osseous abnormality. IMPRESSION: 1. Left lower lobe pneumonia. Electronically Signed   By: Titus Dubin M.D.   On: 12/19/2019 10:41  Scheduled Meds: . vitamin C  500 mg Oral Daily  . aspirin EC  81 mg Oral QODAY  . azithromycin  500 mg Oral Daily  . dexamethasone (DECADRON) injection  6 mg Intravenous Q24H  . heparin injection (subcutaneous)  5,000 Units Subcutaneous Q8H  . losartan  50 mg Oral Daily   And  . hydrochlorothiazide  12.5 mg Oral Daily  . insulin aspart  0-15 Units Subcutaneous TID WC  . insulin aspart  0-5 Units Subcutaneous QHS  . Ipratropium-Albuterol  2 puff Inhalation Q6H  . metFORMIN  500 mg Oral BID WC  . zinc sulfate  220 mg Oral Daily   Continuous Infusions: . cefTRIAXone (ROCEPHIN)  IV 2 g (01/06/2020 2028)  . remdesivir 100 mg in NS 100 mL 100 mg (12/23/19 1205)     LOS: 1 day    Time spent: 35 minutes     Skyla Champagne G Caryssa Elzey, MD [facility] Pager [phone]  If 7PM-7AM, please contact night-coverage [email or web address] Password  [facility] 12/23/2019, 2:19 PM

## 2019-12-23 NOTE — TOC Progression Note (Signed)
Transition of Care [facility]) - Progression Note    Patient Details  Name: Terrence Cisneros MRN: [id number] Date of Birth: 1941-08-21  Transition of Care [facility]) CM/SW Contact  Purcell Mouton, RN Phone Number: 12/23/2019, 2:24 PM  Clinical Narrative:    Pt from home with his wife. Will follow for [facility] needs.         Expected Discharge Plan and Services                                                 Social Determinants of Health (SDOH) Interventions    Readmission Risk Interventions No flowsheet data found.

## 2019-12-24 LAB — CBC WITH DIFFERENTIAL/PLATELET
Abs Immature Granulocytes: 0.24 10*3/uL — ABNORMAL HIGH (ref 0.00–0.07)
Basophils Absolute: 0 10*3/uL (ref 0.0–0.1)
Basophils Relative: 0 %
Eosinophils Absolute: 0 10*3/uL (ref 0.0–0.5)
Eosinophils Relative: 0 %
HCT: 40.6 % (ref 39.0–52.0)
Hemoglobin: 13.5 g/dL (ref 13.0–17.0)
Immature Granulocytes: 1 %
Lymphocytes Relative: 5 %
Lymphs Abs: 0.8 10*3/uL (ref 0.7–4.0)
MCH: 29.5 pg (ref 26.0–34.0)
MCHC: 33.3 g/dL (ref 30.0–36.0)
MCV: 88.6 fL (ref 80.0–100.0)
Monocytes Absolute: 0.8 10*3/uL (ref 0.1–1.0)
Monocytes Relative: 5 %
Neutro Abs: 15.4 10*3/uL — ABNORMAL HIGH (ref 1.7–7.7)
Neutrophils Relative %: 89 %
Platelets: 284 10*3/uL (ref 150–400)
RBC: 4.58 MIL/uL (ref 4.22–5.81)
RDW: 14.6 % (ref 11.5–15.5)
WBC: 17.3 10*3/uL — ABNORMAL HIGH (ref 4.0–10.5)
nRBC: 0 % (ref 0.0–0.2)

## 2019-12-24 LAB — COMPREHENSIVE METABOLIC PANEL
ALT: 43 U/L (ref 0–44)
AST: 42 U/L — ABNORMAL HIGH (ref 15–41)
Albumin: 2.9 g/dL — ABNORMAL LOW (ref 3.5–5.0)
Alkaline Phosphatase: 62 U/L (ref 38–126)
Anion gap: 14 (ref 5–15)
BUN: 48 mg/dL — ABNORMAL HIGH (ref 8–23)
CO2: 19 mmol/L — ABNORMAL LOW (ref 22–32)
Calcium: 8.2 mg/dL — ABNORMAL LOW (ref 8.9–10.3)
Chloride: 105 mmol/L (ref 98–111)
Creatinine, Ser: 1.61 mg/dL — ABNORMAL HIGH (ref 0.61–1.24)
GFR calc Af Amer: 47 mL/min — ABNORMAL LOW (ref >60)
GFR calc non Af Amer: 40 mL/min — ABNORMAL LOW (ref >60)
Glucose, Bld: 240 mg/dL — ABNORMAL HIGH (ref 70–99)
Potassium: 4 mmol/L (ref 3.5–5.1)
Sodium: 138 mmol/L (ref 135–145)
Total Bilirubin: 1 mg/dL (ref 0.3–1.2)
Total Protein: 6.4 g/dL — ABNORMAL LOW (ref 6.5–8.1)

## 2019-12-24 LAB — GLUCOSE, CAPILLARY
Glucose-Capillary: 170 mg/dL — ABNORMAL HIGH (ref 70–99)
Glucose-Capillary: 176 mg/dL — ABNORMAL HIGH (ref 70–99)
Glucose-Capillary: 296 mg/dL — ABNORMAL HIGH (ref 70–99)
Glucose-Capillary: 293 mg/dL — ABNORMAL HIGH (ref 70–99)
Glucose-Capillary: 275 mg/dL — ABNORMAL HIGH (ref 70–99)

## 2019-12-24 LAB — PHOSPHORUS: Phosphorus: 2.9 mg/dL (ref 2.5–4.6)

## 2019-12-24 LAB — PROCALCITONIN: Procalcitonin: 19.39 ng/mL

## 2019-12-24 LAB — FERRITIN: Ferritin: 795 ng/mL — ABNORMAL HIGH (ref 24–336)

## 2019-12-24 LAB — C-REACTIVE PROTEIN: CRP: 14.7 mg/dL — ABNORMAL HIGH (ref <1.0)

## 2019-12-24 LAB — MAGNESIUM: Magnesium: 2.2 mg/dL (ref 1.7–2.4)

## 2019-12-24 LAB — D-DIMER, QUANTITATIVE: D-Dimer, Quant: 1.79 ug/mL-FEU — ABNORMAL HIGH (ref 0.00–0.50)

## 2019-12-24 MED ORDER — AMIODARONE HCL IN DEXTROSE 360-4.14 MG/200ML-% IV SOLN
60.0000 mg/h | INTRAVENOUS | Status: DC
  Administered 2019-12-24: 60 mg/h via INTRAVENOUS
  Filled 2019-12-24: qty 200

## 2019-12-24 MED ORDER — INSULIN DETEMIR 100 UNIT/ML ~~LOC~~ SOLN: 15.0000 [IU] | Freq: Every day | SUBCUTANEOUS | Status: DC

## 2019-12-24 MED ORDER — ENOXAPARIN SODIUM 80 MG/0.8ML ~~LOC~~ SOLN
1.0000 mg/kg | Freq: Two times a day (BID) | SUBCUTANEOUS | Status: DC
  Administered 2019-12-24 – 2019-12-30 (×13): 70 mg via SUBCUTANEOUS
  Filled 2019-12-24 (×13): qty 0.8

## 2019-12-24 MED ORDER — METOPROLOL TARTRATE 25 MG PO TABS: 25.0000 mg | ORAL_TABLET | Freq: Two times a day (BID) | ORAL | Status: DC

## 2019-12-24 MED ORDER — FAMOTIDINE 20 MG PO TABS
20.0000 mg | ORAL_TABLET | Freq: Two times a day (BID) | ORAL | Status: DC
  Administered 2019-12-24 – 2019-12-31 (×16): 20 mg via ORAL
  Filled 2019-12-24 (×17): qty 1

## 2019-12-24 MED ORDER — AMIODARONE LOAD VIA INFUSION
150.0000 mg | Freq: Once | INTRAVENOUS | Status: AC
  Administered 2019-12-24: 150 mg via INTRAVENOUS
  Filled 2019-12-24: qty 83.34

## 2019-12-24 MED ORDER — SODIUM CHLORIDE 0.9 % IV BOLUS
500.0000 mL | Freq: Once | INTRAVENOUS | Status: AC
  Administered 2019-12-24: 500 mL via INTRAVENOUS

## 2019-12-24 MED ORDER — HYDRALAZINE HCL 20 MG/ML IJ SOLN: 5.0000 mg | Freq: Four times a day (QID) | INTRAMUSCULAR | Status: DC | PRN

## 2019-12-24 MED ORDER — DEXAMETHASONE SODIUM PHOSPHATE 10 MG/ML IJ SOLN
6.0000 mg | Freq: Two times a day (BID) | INTRAMUSCULAR | Status: DC
  Administered 2019-12-24 – 2019-12-28 (×8): 6 mg via INTRAVENOUS
  Filled 2019-12-24 (×8): qty 1

## 2019-12-24 MED ORDER — METOPROLOL TARTRATE 5 MG/5ML IV SOLN
2.5000 mg | Freq: Once | INTRAVENOUS | Status: AC
  Administered 2019-12-24: 2.5 mg via INTRAVENOUS
  Filled 2019-12-24: qty 5

## 2019-12-24 MED ORDER — INSULIN ASPART 100 UNIT/ML ~~LOC~~ SOLN: 5.0000 [IU] | Freq: Three times a day (TID) | SUBCUTANEOUS | Status: DC

## 2019-12-24 MED ORDER — AMIODARONE HCL IN DEXTROSE 360-4.14 MG/200ML-% IV SOLN
30.0000 mg/h | INTRAVENOUS | Status: DC
  Filled 2019-12-24: qty 200

## 2019-12-24 NOTE — Progress Notes (Signed)
PROGRESS NOTE                                                                                                                                                                                                             Patient Demographics:    Terrence Cisneros, is a 79 y.o. male, DOB - 1941-03-01, [id number]  Admit date - 01/08/2020   Admitting Physician Reubin Milan, MD  Outpatient Primary MD for the patient is [name], MD  LOS - 2   Chief Complaint  Patient presents with  . Shortness of Breath  . Weakness       Brief Narrative    78 y.o.malewith medical history significant ofacquired hypothyroidism, osteoarthritis, radiculopathy, chronic kidney disease, environmental allergies, hypertension, neuropathy, type 2 diabetes mellitus who is coming to the emergency department due to viral symptoms associated with progressively worse dyspneafor about 10 daysand history of exposure to COVID-19around Christmas with her causing the later tested positive for SARS 2 coronavirus,, patient was noted to be hypoxic, transferred  to [facility] for further management.   Subjective:    Terrence Cisneros today for some dyspnea, cough, he did go into A. fib with RVR overnight.   Assessment  & Plan :    Principal Problem:   Pneumonia due to COVID-19 virus Active Problems:   Essential hypertension   Type 2 diabetes mellitus with hyperglycemia, without long-term current use of insulin (HCC)   AKI (acute kidney injury) ([place])   Lactic acidosis  Acute hypoxic respiratory failure due to COVID-19 pneumonia. -Patient with significant oxygen requirement this morning, he is on 15 L high flow nasal cannula. -Was encouraged use incentive spirometry, flutter valve, out of bed to chair, and to prone once possible -Continue with IV steroids.  We will increase his dose to 6 mg IV twice daily given worsening hypoxia -Continue with IV remdesivir -Not a candidate  for Actemra given elevated procalcitonin at 37 on admission. -Continue with IV Rocephin and azithromycin given elevated pro-Cal, and to cover for bacterial pneumonia. -Continue to trend inflammatory markers closely, as well trend procalcitonin especially with CRP trending up.  COVID-19 Labs  Recent Labs    12/19/2019 1054 12/23/19 0352  DDIMER 1.88* 1.22*  FERRITIN  --  678*  LDH 312*  --   CRP 21.1* 23.0*    Lab  Results  Component Value Date   SARSCOV2NAA NEGATIVE 06/12/2019   A. fib with RVR -New onset, started on amiodarone drip given low blood pressure, has converted back to normal sinus rhythm, currently off amiodarone drip. -Noted on full dose Lovenox for anticoagulation  Type 2 diabetes mellitus with hyperglycemia, without long-term current use of insulin ([place]) -Continue with insulin sliding scale  Essential hypertension - Hold Hyzaar and hydrochlorothiazide due to renal function.  Actually soft blood pressure Monitor BP, renal function electrolytes.  AKI (acute kidney injury) ([place]) Hold losartan. Hold hydrochlorothiazide.   Code Status : Full  Family Communication  :   Disposition Plan  : Home  Consults  :  None  Procedures  : None  DVT Prophylaxis  :  [place] lovenox  Lab Results  Component Value Date   PLT 284 12/24/2019    Antibiotics  :    Anti-infectives (From admission, onward)   Start     Dose/Rate Route Frequency Ordered Stop   12/23/19 1000  remdesivir 100 mg in sodium chloride 0.9 % 100 mL IVPB     100 mg 200 mL/hr over 30 Minutes Intravenous Daily 12/23/2019 1313 12/27/19 0959   01/10/2020 2000  cefTRIAXone (ROCEPHIN) 2 g in sodium chloride 0.9 % 100 mL IVPB     2 g 200 mL/hr over 30 Minutes Intravenous Every 24 hours 12/13/2019 1853 12/27/19 1959   12/24/2019 2000  azithromycin (ZITHROMAX) tablet 500 mg     500 mg Oral Daily 01/04/2020 1853 12/27/19 0959   12/14/2019 1400  remdesivir 200 mg in sodium chloride 0.9% 250 mL IVPB     200 mg 580 mL/hr  over 30 Minutes Intravenous Once 01/06/2020 1313 01/03/2020 1616        Objective:   Vitals:   12/24/19 0645 12/24/19 0800 12/24/19 1200 12/24/19 1300  BP: 100/74 103/75 (!) 95/54   Pulse: (!) 101 (!) 122    Resp: 20 (!) 24 (!) 24 (!) 24  Temp:  97.6 F (36.4 C) (!) 97.5 F (36.4 C)   TempSrc:  Oral    SpO2: 95% 90% 100% 92%  Weight:      Height:        Wt Readings from Last 3 Encounters:  01/03/2020 72.2 kg  06/16/19 76.2 kg  06/11/19 76.2 kg     Intake/Output Summary (Last 24 hours) at 12/24/2019 1356 Last data filed at 12/23/2019 1650 Gross per 24 hour  Intake 314.43 ml  Output --  Net 314.43 ml     Physical Exam  Awake Alert, Oriented X 3, No new F.N deficits, Normal affect Symmetrical Chest wall movement, Good air movement bilaterally, scattered rails irregular irregular, tachycardic,No Gallops,Rubs or new Murmurs, No Parasternal Heave +ve B.Sounds, Abd Soft, No tenderness,  No rebound - guarding or rigidity. No Cyanosis, Clubbing or edema, No new Rash or bruise      Data Review:    CBC Recent Labs  Lab 12/28/2019 1006 12/23/19 0352 12/24/19 1105  WBC 15.9* 13.1* 17.3*  HGB 13.1 12.2* 13.5  HCT 39.2 36.9* 40.6  PLT 273 248 284  MCV 88.5 88.7 88.6  MCH 29.6 29.3 29.5  MCHC 33.4 33.1 33.3  RDW 13.7 13.9 14.6  LYMPHSABS  --  0.8 0.8  MONOABS  --  0.8 0.8  EOSABS  --  0.0 0.0  BASOSABS  --  0.0 0.0    Chemistries  Recent Labs  Lab 01/02/2020 1006 12/23/19 0352  NA 133* 134*  K 3.6 3.4*  CL 95* 98  CO2 23 23  GLUCOSE 208* 209*  BUN 56* 55*  CREATININE 2.26* 1.83*  CALCIUM 9.2 8.4*  MG  --  2.2  AST  --  47*  ALT  --  61*  ALKPHOS  --  59  BILITOT  --  0.1*   ------------------------------------------------------------------------------------------------------------------ No results for input(s): CHOL, HDL, LDLCALC, TRIG, CHOLHDL, LDLDIRECT in the last 72 hours.  Lab Results  Component Value Date   HGBA1C 7.4 (H) 12/23/2019    ------------------------------------------------------------------------------------------------------------------ No results for input(s): TSH, T4TOTAL, T3FREE, THYROIDAB in the last 72 hours.  Invalid input(s): FREET3 ------------------------------------------------------------------------------------------------------------------ Recent Labs    12/23/19 0352  FERRITIN 678*    Coagulation profile No results for input(s): INR, PROTIME in the last 168 hours.  Recent Labs    12/14/2019 1054 12/23/19 0352  DDIMER 1.88* 1.22*    Cardiac Enzymes No results for input(s): CKMB, TROPONINI, MYOGLOBIN in the last 168 hours.  Invalid input(s): CK ------------------------------------------------------------------------------------------------------------------    Component Value Date/Time   BNP 104.8 (H) 12/14/2019 1014    Inpatient Medications  Scheduled Meds: . vitamin C  500 mg Oral Daily  . aspirin EC  81 mg Oral QODAY  . azithromycin  500 mg Oral Daily  . dexamethasone (DECADRON) injection  6 mg Intravenous Q24H  . enoxaparin (LOVENOX) injection  1 mg/kg Subcutaneous Q12H  . insulin aspart  0-15 Units Subcutaneous TID WC  . insulin aspart  0-5 Units Subcutaneous QHS  . Ipratropium-Albuterol  2 puff Inhalation Q6H  . linagliptin  5 mg Oral Daily  . zinc sulfate  220 mg Oral Daily   Continuous Infusions: . cefTRIAXone (ROCEPHIN)  IV Stopped (12/23/19 2130)  . remdesivir 100 mg in NS 100 mL 100 mg (12/24/19 0853)   PRN Meds:.chlorpheniramine-HYDROcodone, guaiFENesin-dextromethorphan  Micro Results No results found for this or any previous visit (from the past 240 hour(s)).  Radiology Reports CT ANGIO CHEST PE W OR WO CONTRAST  Result Date: 12/23/2019 CLINICAL DATA:  Shortness of breath. Coronavirus exposure in late December. Fever and shortness of breath. EXAM: CT ANGIOGRAPHY CHEST WITH CONTRAST TECHNIQUE: Multidetector CT imaging of the chest was performed using the  standard protocol during bolus administration of intravenous contrast. Multiplanar CT image reconstructions and MIPs were obtained to evaluate the vascular anatomy. CONTRAST:  80mL OMNIPAQUE IOHEXOL 350 MG/ML SOLN COMPARISON:  Chest radiography yesterday. FINDINGS: Cardiovascular: Pulmonary arterial opacification is good. No pulmonary emboli. The aorta shows advanced atherosclerotic disease with irregular plaque. No aneurysm or dissection. Heart size is normal. There is coronary artery calcification. Mediastinum/Nodes: No mediastinal mass or adenopathy. Small mediastinal lymph nodes are present. Single exception to this is a right paratracheal node in the upper chest measuring 1.4 cm in diameter. Lungs/Pleura: Widespread bilateral hazy mid and lower lung pulmonary infiltrates typical of coronavirus infection. No dense consolidation, collapse or effusion. No sign of underlying lung lesion. Upper Abdomen: Negative Musculoskeletal: Ordinary degenerative changes affect the spine. Review of the MIP images confirms the above findings. IMPRESSION: Widespread bilateral pulmonary infiltrates which are hazy and lower chest predominant. Findings typical of coronavirus pneumonia. No dense consolidation, lobar collapse or pleural effusion. No pulmonary emboli. Considerable aortic atherosclerosis.  Coronary artery calcification. Electronically Signed   By: Nelson Chimes M.D.   On: 12/23/2019 15:46   DG Chest Portable 1 View  Result Date: 01/01/2020 CLINICAL DATA:  Cough and shortness of breath. EXAM: PORTABLE CHEST 1 VIEW COMPARISON:  Chest x-ray dated January 08, 2019. FINDINGS: The heart size  and mediastinal contours are within normal limits. Normal pulmonary vascularity. Patchy opacities at the left lung base. No pleural effusion or pneumothorax. No acute osseous abnormality. IMPRESSION: 1. Left lower lobe pneumonia. Electronically Signed   By: Titus Dubin M.D.   On: 12/23/2019 10:41      Phillips Climes M.D on  12/24/2019 at 1:56 PM  Between 7am to 7pm - Pager - [phone]  After 7pm go to [email or web address] - password [facility]  [facility] -  Office  [phone]

## 2019-12-24 NOTE — Progress Notes (Signed)
Approximately 2:00 patient had status change in heart rhythm and became tachycardic. He went from NSR to Afib. Nurse notified attending for orders. Patient MEWS scores go back and forth between red and yellow. Nurse is monitoring patient closely with vital signs taken every 15 minutes.

## 2019-12-24 NOTE — Progress Notes (Signed)
ANTICOAGULATION CONSULT NOTE - Initial Consult  Pharmacy Consult for Lovenox Indication: atrial fibrillation  Allergies  Allergen Reactions  . Lisinopril Other (See Comments)    BP dropped very low    Patient Measurements: Height: 5\' 8"  (172.7 cm) Weight: 159 lb 3.2 oz (72.2 kg) IBW/kg (Calculated) : 68.4  Vital Signs: Temp: 97.6 F (36.4 C) (01/13 0800) Temp Source: Oral (01/13 0800) BP: 103/75 (01/13 0800) Pulse Rate: 122 (01/13 0800)  Labs: Recent Labs    12/23/2019 1006 01/06/2020 1214 12/23/19 0352  HGB 13.1  --  12.2*  HCT 39.2  --  36.9*  PLT 273  --  248  CREATININE 2.26*  --  1.83*  TROPONINIHS 45* 47*  --     Estimated Creatinine Clearance: 32.2 mL/min (A) (by C-G formula based on SCr of 1.83 mg/dL (H)).   Medical History: Past Medical History:  Diagnosis Date  . Acquired hypothyroidism   . Arthritis   . Cervical radiculopathy    "fingers get cold and my neck pops when i turn it dide to side, theyre going to send me for a nerve induction next month"  . Chronic kidney disease    "they told me a couple years ago  to avoid taking ibuprofen because my kidneys werent 100%"  . Environmental allergies   . Hypertension   . Neuropathy    "ive got numbness in my left foot "   . Type 2 diabetes mellitus (HCC)     Medications: See med list  Assessment: 79 y/o M admitted with COVID-19 PNA. Patient has developed afib w/ RVR. Pharmacy is consulted for Lovenox. Patient has a h/o CKD with SCr near baseline yesterday, improved from admission. Awaiting labs today.   CHA2DS2-VASc Score =  4  Last dose of SQH 0600   Goal of Therapy:  Anti-Xa level 0.6-1 units/ml 4hrs after LMWH dose given Monitor platelets by anticoagulation protocol: Yes   Plan:  -Initiate Lovenox 70 mg (1 mg/kg) q 12 hours -Will need to follow renal function closely and consider levels due to CKD -F/U plans for transition to oral agent -CBC q 72h -F/U s/s of bleeding  Ulice Dash  D 12/24/2019,10:02 AM

## 2019-12-25 DIAGNOSIS — E1165 Type 2 diabetes mellitus with hyperglycemia: Secondary | ICD-10-CM

## 2019-12-25 LAB — CBC WITH DIFFERENTIAL/PLATELET
Abs Immature Granulocytes: 0.36 10*3/uL — ABNORMAL HIGH (ref 0.00–0.07)
Basophils Absolute: 0.1 10*3/uL (ref 0.0–0.1)
Basophils Relative: 0 %
Eosinophils Absolute: 0 10*3/uL (ref 0.0–0.5)
Eosinophils Relative: 0 %
HCT: 37.5 % — ABNORMAL LOW (ref 39.0–52.0)
Hemoglobin: 12.6 g/dL — ABNORMAL LOW (ref 13.0–17.0)
Immature Granulocytes: 2 %
Lymphocytes Relative: 4 %
Lymphs Abs: 0.7 10*3/uL (ref 0.7–4.0)
MCH: 29.4 pg (ref 26.0–34.0)
MCHC: 33.6 g/dL (ref 30.0–36.0)
MCV: 87.4 fL (ref 80.0–100.0)
Monocytes Absolute: 0.7 10*3/uL (ref 0.1–1.0)
Monocytes Relative: 4 %
Neutro Abs: 15.6 10*3/uL — ABNORMAL HIGH (ref 1.7–7.7)
Neutrophils Relative %: 90 %
Platelets: 368 10*3/uL (ref 150–400)
RBC: 4.29 MIL/uL (ref 4.22–5.81)
RDW: 14.5 % (ref 11.5–15.5)
WBC: 17.5 10*3/uL — ABNORMAL HIGH (ref 4.0–10.5)
nRBC: 0 % (ref 0.0–0.2)

## 2019-12-25 LAB — C-REACTIVE PROTEIN: CRP: 12.4 mg/dL — ABNORMAL HIGH (ref <1.0)

## 2019-12-25 LAB — COMPREHENSIVE METABOLIC PANEL
ALT: 41 U/L (ref 0–44)
AST: 37 U/L (ref 15–41)
Albumin: 2.8 g/dL — ABNORMAL LOW (ref 3.5–5.0)
Alkaline Phosphatase: 66 U/L (ref 38–126)
Anion gap: 12 (ref 5–15)
BUN: 47 mg/dL — ABNORMAL HIGH (ref 8–23)
CO2: 21 mmol/L — ABNORMAL LOW (ref 22–32)
Calcium: 8.4 mg/dL — ABNORMAL LOW (ref 8.9–10.3)
Chloride: 103 mmol/L (ref 98–111)
Creatinine, Ser: 1.55 mg/dL — ABNORMAL HIGH (ref 0.61–1.24)
GFR calc Af Amer: 49 mL/min — ABNORMAL LOW (ref >60)
GFR calc non Af Amer: 42 mL/min — ABNORMAL LOW (ref >60)
Glucose, Bld: 276 mg/dL — ABNORMAL HIGH (ref 70–99)
Potassium: 3.6 mmol/L (ref 3.5–5.1)
Sodium: 136 mmol/L (ref 135–145)
Total Bilirubin: 0.1 mg/dL — ABNORMAL LOW (ref 0.3–1.2)
Total Protein: 6.4 g/dL — ABNORMAL LOW (ref 6.5–8.1)

## 2019-12-25 LAB — GLUCOSE, CAPILLARY
Glucose-Capillary: 266 mg/dL — ABNORMAL HIGH (ref 70–99)
Glucose-Capillary: 293 mg/dL — ABNORMAL HIGH (ref 70–99)
Glucose-Capillary: 308 mg/dL — ABNORMAL HIGH (ref 70–99)

## 2019-12-25 LAB — BRAIN NATRIURETIC PEPTIDE: B Natriuretic Peptide: 148.6 pg/mL — ABNORMAL HIGH (ref 0.0–100.0)

## 2019-12-25 LAB — D-DIMER, QUANTITATIVE: D-Dimer, Quant: 1.69 ug/mL-FEU — ABNORMAL HIGH (ref 0.00–0.50)

## 2019-12-25 LAB — PHOSPHORUS: Phosphorus: 2.6 mg/dL (ref 2.5–4.6)

## 2019-12-25 LAB — ABO/RH: ABO/RH(D): A POS

## 2019-12-25 LAB — PROCALCITONIN: Procalcitonin: 11.43 ng/mL

## 2019-12-25 LAB — MAGNESIUM: Magnesium: 2.3 mg/dL (ref 1.7–2.4)

## 2019-12-25 MED ORDER — INSULIN DETEMIR 100 UNIT/ML ~~LOC~~ SOLN
5.0000 [IU] | Freq: Every day | SUBCUTANEOUS | Status: DC
  Administered 2019-12-25 – 2019-12-28 (×4): 5 [IU] via SUBCUTANEOUS
  Filled 2019-12-25 (×4): qty 0.05

## 2019-12-25 MED ORDER — POTASSIUM CHLORIDE CRYS ER 20 MEQ PO TBCR
40.0000 meq | EXTENDED_RELEASE_TABLET | Freq: Once | ORAL | Status: AC
  Administered 2019-12-25: 40 meq via ORAL
  Filled 2019-12-25: qty 2

## 2019-12-25 MED ORDER — SODIUM CHLORIDE 0.9% IV SOLUTION: Freq: Once | INTRAVENOUS | Status: AC

## 2019-12-25 NOTE — Progress Notes (Addendum)
PROGRESS NOTE                                                                                                                                                                                                             Patient Demographics:    Terrence Cisneros, is a 79 y.o. male, DOB - 07-28-1941, YC:[id number]  Admit date - 12/24/2019   Admitting Physician Reubin Milan, MD  Outpatient Primary MD for the patient is [name], MD  LOS - 3   Chief Complaint  Patient presents with  . Shortness of Breath  . Weakness       Brief Narrative    79 y.o.malewith medical history significant ofacquired hypothyroidism, osteoarthritis, radiculopathy, chronic kidney disease, environmental allergies, hypertension, neuropathy, type 2 diabetes mellitus who is coming to the emergency department due to viral symptoms associated with progressively worse dyspneafor about 10 daysand history of exposure to COVID-19around Christmas with her causing the later tested positive for SARS 2 coronavirus,, patient was noted to be hypoxic, transferred  to [facility] for further management.   Subjective:    Terrence Cisneros today reports dyspnea, cough, no recurrence of A. fib overnight .   Assessment  & Plan :    Principal Problem:   Pneumonia due to COVID-19 virus Active Problems:   Essential hypertension   Type 2 diabetes mellitus with hyperglycemia, without long-term current use of insulin (HCC)   AKI (acute kidney injury) ([place])   Lactic acidosis  Acute hypoxic respiratory failure due to COVID-19 pneumonia. -Patient with significant oxygen requirement this morning, he remains on 15 L high flow nasal cannula today. -Was encouraged use incentive spirometry, flutter valve, out of bed to chair, and to prone once possible -Continue with IV steroids.  Increased his dose to 6 mg IV twice daily given worsening hypoxia -Continue with IV remdesivir -Discussed  convalescent plasma with the patient today,  -Not a candidate for Actemra given elevated procalcitonin at 37 on admission. -Continue with IV Rocephin and azithromycin given elevated pro-Cal, and to cover for bacterial pneumonia. -Continue to trend inflammatory markers closely, as well trend procalcitonin especially with CRP trending up.  COVID-19 Labs  Recent Labs    12/23/19 0352 12/24/19 1105 12/25/19 0202  DDIMER 1.22* 1.79* 1.69*  FERRITIN 678* 795*  --   CRP 23.0* 14.7* 12.4*  Lab Results  Component Value Date   SARSCOV2NAA NEGATIVE 06/12/2019   A. fib with RVR -New onset, started on amiodarone drip given low blood pressure, has converted back to normal sinus rhythm, currently off amiodarone drip. -Noted on full dose Lovenox for anticoagulation  Type 2 diabetes mellitus with hyperglycemia, without long-term current use of insulin ([place]) -Continue with insulin sliding scale, remains elevated, started on Levemir 5 units  Essential hypertension - Hold Hyzaar and hydrochlorothiazide due to renal function.  Actually soft blood pressure Monitor BP, renal function electrolytes.  AKI (acute kidney injury) ([place]) -Losartan and hydrochlorothiazide especially in the setting of low blood pressure -improving  Code Status : Full  Family Communication  : Discussed with wife via phone 1/13, will update again today  Disposition Plan  : Home  Consults  :  None  Procedures  : None  DVT Prophylaxis  :   lovenox  Lab Results  Component Value Date   PLT 368 12/25/2019    Antibiotics  :    Anti-infectives (From admission, onward)   Start     Dose/Rate Route Frequency Ordered Stop   12/23/19 1000  remdesivir 100 mg in sodium chloride 0.9 % 100 mL IVPB     100 mg 200 mL/hr over 30 Minutes Intravenous Daily 01/02/2020 1313 12/27/19 0959   12/21/2019 2000  cefTRIAXone (ROCEPHIN) 2 g in sodium chloride 0.9 % 100 mL IVPB     2 g 200 mL/hr over 30 Minutes Intravenous Every 24  hours 12/14/2019 1853 12/27/19 1959   12/21/2019 2000  azithromycin (ZITHROMAX) tablet 500 mg     500 mg Oral Daily 01/06/2020 1853 12/27/19 0959   12/31/2019 1400  remdesivir 200 mg in sodium chloride 0.9% 250 mL IVPB     200 mg 580 mL/hr over 30 Minutes Intravenous Once 12/14/2019 1313 12/25/2019 1616        Objective:   Vitals:   12/24/19 1933 12/25/19 0053 12/25/19 0401 12/25/19 0800  BP: 102/60 133/69 135/69 129/62  Pulse: 93 81 88 94  Resp: (!) 23 20 18 20   Temp: 97.9 F (36.6 C) (!) 97.4 F (36.3 C)  97.6 F (36.4 C)  TempSrc: Oral Axillary  Axillary  SpO2: 96% 96% 95% 94%  Weight:      Height:        Wt Readings from Last 3 Encounters:  12/15/2019 72.2 kg  06/16/19 76.2 kg  06/11/19 76.2 kg     Intake/Output Summary (Last 24 hours) at 12/25/2019 1332 Last data filed at 12/25/2019 0000 Gross per 24 hour  Intake 750 ml  Output 500 ml  Net 250 ml     Physical Exam  Awake Alert, Oriented X 3, No new F.N deficits, Normal affect Symmetrical Chest wall movement, Good air movement bilaterally, B/L rales RRR,No Gallops,Rubs or new Murmurs, No Parasternal Heave +ve B.Sounds, Abd Soft, No tenderness, No rebound - guarding or rigidity. No Cyanosis, Clubbing or edema, No new Rash or bruise       Data Review:    CBC Recent Labs  Lab 12/31/2019 1006 12/23/19 0352 12/24/19 1105 12/25/19 0202  WBC 15.9* 13.1* 17.3* 17.5*  HGB 13.1 12.2* 13.5 12.6*  HCT 39.2 36.9* 40.6 37.5*  PLT 273 248 284 368  MCV 88.5 88.7 88.6 87.4  MCH 29.6 29.3 29.5 29.4  MCHC 33.4 33.1 33.3 33.6  RDW 13.7 13.9 14.6 14.5  LYMPHSABS  --  0.8 0.8 0.7  MONOABS  --  0.8 0.8 0.7  EOSABS  --  0.0 0.0 0.0  BASOSABS  --  0.0 0.0 0.1    Chemistries  Recent Labs  Lab 01/09/2020 1006 12/23/19 0352 12/24/19 1105 12/25/19 0202  NA 133* 134* 138 136  K 3.6 3.4* 4.0 3.6  CL 95* 98 105 103  CO2 23 23 19* 21*  GLUCOSE 208* 209* 240* 276*  BUN 56* 55* 48* 47*  CREATININE 2.26* 1.83* 1.61* 1.55*    CALCIUM 9.2 8.4* 8.2* 8.4*  MG  --  2.2 2.2 2.3  AST  --  47* 42* 37  ALT  --  61* 43 41  ALKPHOS  --  59 62 66  BILITOT  --  0.1* 1.0 0.1*   ------------------------------------------------------------------------------------------------------------------ No results for input(s): CHOL, HDL, LDLCALC, TRIG, CHOLHDL, LDLDIRECT in the last 72 hours.  Lab Results  Component Value Date   HGBA1C 7.4 (H) 12/23/2019   ------------------------------------------------------------------------------------------------------------------ No results for input(s): TSH, T4TOTAL, T3FREE, THYROIDAB in the last 72 hours.  Invalid input(s): FREET3 ------------------------------------------------------------------------------------------------------------------ Recent Labs    12/23/19 0352 12/24/19 1105  FERRITIN 678* 795*    Coagulation profile No results for input(s): INR, PROTIME in the last 168 hours.  Recent Labs    12/24/19 1105 12/25/19 0202  DDIMER 1.79* 1.69*    Cardiac Enzymes No results for input(s): CKMB, TROPONINI, MYOGLOBIN in the last 168 hours.  Invalid input(s): CK ------------------------------------------------------------------------------------------------------------------    Component Value Date/Time   BNP 148.6 (H) 12/25/2019 0202    Inpatient Medications  Scheduled Meds: . vitamin C  500 mg Oral Daily  . aspirin EC  81 mg Oral QODAY  . azithromycin  500 mg Oral Daily  . dexamethasone (DECADRON) injection  6 mg Intravenous Q12H  . enoxaparin (LOVENOX) injection  1 mg/kg Subcutaneous Q12H  . famotidine  20 mg Oral BID  . insulin aspart  0-15 Units Subcutaneous TID WC  . insulin aspart  0-5 Units Subcutaneous QHS  . insulin detemir  5 Units Subcutaneous Daily  . Ipratropium-Albuterol  2 puff Inhalation Q6H  . linagliptin  5 mg Oral Daily  . zinc sulfate  220 mg Oral Daily   Continuous Infusions: . cefTRIAXone (ROCEPHIN)  IV Stopped (12/24/19 2045)  .  remdesivir 100 mg in NS 100 mL 100 mg (12/25/19 0911)   PRN Meds:.chlorpheniramine-HYDROcodone, guaiFENesin-dextromethorphan  Micro Results No results found for this or any previous visit (from the past 240 hour(s)).  Radiology Reports CT ANGIO CHEST PE W OR WO CONTRAST  Result Date: 12/23/2019 CLINICAL DATA:  Shortness of breath. Coronavirus exposure in late December. Fever and shortness of breath. EXAM: CT ANGIOGRAPHY CHEST WITH CONTRAST TECHNIQUE: Multidetector CT imaging of the chest was performed using the standard protocol during bolus administration of intravenous contrast. Multiplanar CT image reconstructions and MIPs were obtained to evaluate the vascular anatomy. CONTRAST:  13mL OMNIPAQUE IOHEXOL 350 MG/ML SOLN COMPARISON:  Chest radiography yesterday. FINDINGS: Cardiovascular: Pulmonary arterial opacification is good. No pulmonary emboli. The aorta shows advanced atherosclerotic disease with irregular plaque. No aneurysm or dissection. Heart size is normal. There is coronary artery calcification. Mediastinum/Nodes: No mediastinal mass or adenopathy. Small mediastinal lymph nodes are present. Single exception to this is a right paratracheal node in the upper chest measuring 1.4 cm in diameter. Lungs/Pleura: Widespread bilateral hazy mid and lower lung pulmonary infiltrates typical of coronavirus infection. No dense consolidation, collapse or effusion. No sign of underlying lung lesion. Upper Abdomen: Negative Musculoskeletal: Ordinary degenerative changes affect the spine. Review of the MIP images confirms the above findings. IMPRESSION:  Widespread bilateral pulmonary infiltrates which are hazy and lower chest predominant. Findings typical of coronavirus pneumonia. No dense consolidation, lobar collapse or pleural effusion. No pulmonary emboli. Considerable aortic atherosclerosis.  Coronary artery calcification. Electronically Signed   By: Nelson Chimes M.D.   On: 12/23/2019 15:46   DG Chest  Portable 1 View  Result Date: 12/31/2019 CLINICAL DATA:  Cough and shortness of breath. EXAM: PORTABLE CHEST 1 VIEW COMPARISON:  Chest x-ray dated January 08, 2019. FINDINGS: The heart size and mediastinal contours are within normal limits. Normal pulmonary vascularity. Patchy opacities at the left lung base. No pleural effusion or pneumothorax. No acute osseous abnormality. IMPRESSION: 1. Left lower lobe pneumonia. Electronically Signed   By: Titus Dubin M.D.   On: 12/18/2019 10:41      Phillips Climes M.D on 12/25/2019 at 1:32 PM  Between 7am to 7pm - Pager - [phone]  After 7pm go to [email or web address] - password [facility]  [facility] -  Office  [phone]

## 2019-12-26 LAB — CBC WITH DIFFERENTIAL/PLATELET
Abs Immature Granulocytes: 0.68 10*3/uL — ABNORMAL HIGH (ref 0.00–0.07)
Basophils Absolute: 0.1 10*3/uL (ref 0.0–0.1)
Basophils Relative: 0 %
Eosinophils Absolute: 0 10*3/uL (ref 0.0–0.5)
Eosinophils Relative: 0 %
HCT: 35.1 % — ABNORMAL LOW (ref 39.0–52.0)
Hemoglobin: 11.7 g/dL — ABNORMAL LOW (ref 13.0–17.0)
Immature Granulocytes: 3 %
Lymphocytes Relative: 3 %
Lymphs Abs: 0.7 10*3/uL (ref 0.7–4.0)
MCH: 29.3 pg (ref 26.0–34.0)
MCHC: 33.3 g/dL (ref 30.0–36.0)
MCV: 87.8 fL (ref 80.0–100.0)
Monocytes Absolute: 1.1 10*3/uL — ABNORMAL HIGH (ref 0.1–1.0)
Monocytes Relative: 5 %
Neutro Abs: 18.8 10*3/uL — ABNORMAL HIGH (ref 1.7–7.7)
Neutrophils Relative %: 89 %
Platelets: 374 10*3/uL (ref 150–400)
RBC: 4 MIL/uL — ABNORMAL LOW (ref 4.22–5.81)
RDW: 14.6 % (ref 11.5–15.5)
WBC: 21.3 10*3/uL — ABNORMAL HIGH (ref 4.0–10.5)
nRBC: 0 % (ref 0.0–0.2)

## 2019-12-26 LAB — GLUCOSE, CAPILLARY
Glucose-Capillary: 379 mg/dL — ABNORMAL HIGH (ref 70–99)
Glucose-Capillary: 176 mg/dL — ABNORMAL HIGH (ref 70–99)
Glucose-Capillary: 191 mg/dL — ABNORMAL HIGH (ref 70–99)
Glucose-Capillary: 349 mg/dL — ABNORMAL HIGH (ref 70–99)

## 2019-12-26 LAB — COMPREHENSIVE METABOLIC PANEL
ALT: 46 U/L — ABNORMAL HIGH (ref 0–44)
AST: 42 U/L — ABNORMAL HIGH (ref 15–41)
Albumin: 2.9 g/dL — ABNORMAL LOW (ref 3.5–5.0)
Alkaline Phosphatase: 71 U/L (ref 38–126)
Anion gap: 10 (ref 5–15)
BUN: 42 mg/dL — ABNORMAL HIGH (ref 8–23)
CO2: 23 mmol/L (ref 22–32)
Calcium: 8.9 mg/dL (ref 8.9–10.3)
Chloride: 107 mmol/L (ref 98–111)
Creatinine, Ser: 1.38 mg/dL — ABNORMAL HIGH (ref 0.61–1.24)
GFR calc Af Amer: 56 mL/min — ABNORMAL LOW (ref >60)
GFR calc non Af Amer: 49 mL/min — ABNORMAL LOW (ref >60)
Glucose, Bld: 168 mg/dL — ABNORMAL HIGH (ref 70–99)
Potassium: 4.2 mmol/L (ref 3.5–5.1)
Sodium: 140 mmol/L (ref 135–145)
Total Bilirubin: 0.2 mg/dL — ABNORMAL LOW (ref 0.3–1.2)
Total Protein: 6.3 g/dL — ABNORMAL LOW (ref 6.5–8.1)

## 2019-12-26 LAB — D-DIMER, QUANTITATIVE: D-Dimer, Quant: 1.22 ug/mL-FEU — ABNORMAL HIGH (ref 0.00–0.50)

## 2019-12-26 LAB — BPAM FFP
Blood Product Expiration Date: 202101152105
ISSUE DATE / TIME: 202101142228
Unit Type and Rh: 6200

## 2019-12-26 LAB — PREPARE FRESH FROZEN PLASMA

## 2019-12-26 LAB — PROCALCITONIN: Procalcitonin: 4.12 ng/mL

## 2019-12-26 LAB — MAGNESIUM: Magnesium: 2.2 mg/dL (ref 1.7–2.4)

## 2019-12-26 LAB — C-REACTIVE PROTEIN: CRP: 6.5 mg/dL — ABNORMAL HIGH (ref <1.0)

## 2019-12-26 LAB — PHOSPHORUS: Phosphorus: 2.2 mg/dL — ABNORMAL LOW (ref 2.5–4.6)

## 2019-12-26 MED ORDER — K PHOS MONO-SOD PHOS DI & MONO 155-852-130 MG PO TABS
500.0000 mg | ORAL_TABLET | Freq: Two times a day (BID) | ORAL | Status: AC
  Administered 2019-12-26 – 2019-12-27 (×4): 500 mg via ORAL
  Filled 2019-12-26 (×4): qty 2

## 2019-12-26 NOTE — Progress Notes (Addendum)
PROGRESS NOTE                                                                                                                                                                                                             Patient Demographics:    Terrence Cisneros, is a 79 y.o. male, DOB - 1941-10-19, YC:[id number]  Admit date - 12/16/2019   Admitting Physician Reubin Milan, MD  Outpatient Primary MD for the patient is [name], MD  LOS - 4   Chief Complaint  Patient presents with  . Shortness of Breath  . Weakness       Brief Narrative    79 y.o.malewith medical history significant ofacquired hypothyroidism, osteoarthritis, radiculopathy, chronic kidney disease, environmental allergies, hypertension, neuropathy, type 2 diabetes mellitus who is coming to the emergency department due to viral symptoms associated with progressively worse dyspneafor about 10 daysand history of exposure to COVID-19around Christmas with her causing the later tested positive for SARS 2 coronavirus,, patient was noted to be hypoxic, transferred  to [facility] for further management.   Subjective:    Terrence Cisneros today reports dyspnea, cough, no recurrence of A. fib overnight .   Assessment  & Plan :    Principal Problem:   Pneumonia due to COVID-19 virus Active Problems:   Essential hypertension   Type 2 diabetes mellitus with hyperglycemia, without long-term current use of insulin (HCC)   AKI (acute kidney injury) ([place])   Lactic acidosis  Acute hypoxic respiratory failure due to COVID-19 pneumonia. -Patient with significant oxygen requirement this morning, he remains on 15 L high flow nasal cannula today. -Was encouraged use incentive spirometry, flutter valve, out of bed to chair, and to prone once possible -Continue with IV steroids.  Increased his dose to 6 mg IV twice daily given worsening hypoxia -Continue with IV remdesivir -Received convalescent  plasma 1/14 -Not a candidate for Actemra given elevated procalcitonin at 37 on admission. -Continue with IV Rocephin and azithromycin given elevated pro-Cal, and to cover for bacterial pneumonia. -Continue to trend inflammatory markers closely. -Cytosis most likely related to steroids, especially with procalcitonin trending down  COVID-19 Labs  Recent Labs    12/24/19 1105 12/25/19 0202 12/26/19 0148  DDIMER 1.79* 1.69* 1.22*  FERRITIN 795*  --   --   CRP 14.7* 12.4* 6.5*  Lab Results  Component Value Date   SARSCOV2NAA NEGATIVE 06/12/2019   A. fib with RVR -New onset, Coreg amiodarone drip briefly, then converted to normal sinus rhythm. -started on full dose Lovenox for anticoagulation  Type 2 diabetes mellitus with hyperglycemia, without long-term current use of insulin (HCC) -Continue with insulin sliding scale, remains elevated, started on Levemir 5 units  Essential hypertension - Hold Hyzaar and hydrochlorothiazide due to renal function.  Soft blood pressure initially, now blood pressure started to increase, will start on low-dose metoprolol .  AKI (acute kidney injury) ([place]) -Losartan and hydrochlorothiazide especially in the setting of low blood pressure -improving  Hypophosphatemia -Repleted  Code Status : Full  Family Communication  : Discussed with wife via phone 1/13  Disposition Plan  : Home  Consults  :  None  Procedures  : None  DVT Prophylaxis  :  Vass lovenox  Lab Results  Component Value Date   PLT 374 12/26/2019    Antibiotics  :    Anti-infectives (From admission, onward)   Start     Dose/Rate Route Frequency Ordered Stop   12/23/19 1000  remdesivir 100 mg in sodium chloride 0.9 % 100 mL IVPB     100 mg 200 mL/hr over 30 Minutes Intravenous Daily 01/11/2020 1313 12/26/19 1013   12/20/2019 2000  cefTRIAXone (ROCEPHIN) 2 g in sodium chloride 0.9 % 100 mL IVPB     2 g 200 mL/hr over 30 Minutes Intravenous Every 24 hours 01/03/2020 1853  12/27/19 1959   12/12/2019 2000  azithromycin (ZITHROMAX) tablet 500 mg     500 mg Oral Daily 12/24/2019 1853 12/26/19 0914   12/20/2019 1400  remdesivir 200 mg in sodium chloride 0.9% 250 mL IVPB     200 mg 580 mL/hr over 30 Minutes Intravenous Once 12/21/2019 1313 01/06/2020 1616        Objective:   Vitals:   12/26/19 0315 12/26/19 0400 12/26/19 0815 12/26/19 1203  BP: 108/67 134/72 130/79 123/73  Pulse: 87 88 88 94  Resp:  17 (!) 22 20  Temp:  98 F (36.7 C) 97.9 F (36.6 C) 97.8 F (36.6 C)  TempSrc:  Oral Oral Oral  SpO2: 95% 94% 95% 100%  Weight:      Height:        Wt Readings from Last 3 Encounters:  12/25/2019 72.2 kg  06/16/19 76.2 kg  06/11/19 76.2 kg     Intake/Output Summary (Last 24 hours) at 12/26/2019 1533 Last data filed at 12/26/2019 0500 Gross per 24 hour  Intake 325 ml  Output 500 ml  Net -175 ml     Physical Exam  Awake Alert, Oriented X 3, No new F.N deficits, Normal affect Symmetrical Chest wall movement, Good air movement bilaterally, B/L rales RRR,No Gallops,Rubs or new Murmurs, No Parasternal Heave +ve B.Sounds, Abd Soft, No tenderness, No rebound - guarding or rigidity. No Cyanosis, Clubbing or edema, No new Rash or bruise       Data Review:    CBC Recent Labs  Lab 01/02/2020 1006 12/23/19 0352 12/24/19 1105 12/25/19 0202 12/26/19 0148  WBC 15.9* 13.1* 17.3* 17.5* 21.3*  HGB 13.1 12.2* 13.5 12.6* 11.7*  HCT 39.2 36.9* 40.6 37.5* 35.1*  PLT 273 248 284 368 374  MCV 88.5 88.7 88.6 87.4 87.8  MCH 29.6 29.3 29.5 29.4 29.3  MCHC 33.4 33.1 33.3 33.6 33.3  RDW 13.7 13.9 14.6 14.5 14.6  LYMPHSABS  --  0.8 0.8 0.7 0.7  MONOABS  --  0.8 0.8 0.7 1.1*  EOSABS  --  0.0 0.0 0.0 0.0  BASOSABS  --  0.0 0.0 0.1 0.1    Chemistries  Recent Labs  Lab 12/19/2019 1006 12/23/19 0352 12/24/19 1105 12/25/19 0202 12/26/19 0148  NA 133* 134* 138 136 140  K 3.6 3.4* 4.0 3.6 4.2  CL 95* 98 105 103 107  CO2 23 23 19* 21* 23  GLUCOSE 208* 209* 240*  276* 168*  BUN 56* 55* 48* 47* 42*  CREATININE 2.26* 1.83* 1.61* 1.55* 1.38*  CALCIUM 9.2 8.4* 8.2* 8.4* 8.9  MG  --  2.2 2.2 2.3 2.2  AST  --  47* 42* 37 42*  ALT  --  61* 43 41 46*  ALKPHOS  --  59 62 66 71  BILITOT  --  0.1* 1.0 0.1* 0.2*   ------------------------------------------------------------------------------------------------------------------ No results for input(s): CHOL, HDL, LDLCALC, TRIG, CHOLHDL, LDLDIRECT in the last 72 hours.  Lab Results  Component Value Date   HGBA1C 7.4 (H) 12/23/2019   ------------------------------------------------------------------------------------------------------------------ No results for input(s): TSH, T4TOTAL, T3FREE, THYROIDAB in the last 72 hours.  Invalid input(s): FREET3 ------------------------------------------------------------------------------------------------------------------ Recent Labs    12/24/19 1105  FERRITIN 795*    Coagulation profile No results for input(s): INR, PROTIME in the last 168 hours.  Recent Labs    12/25/19 0202 12/26/19 0148  DDIMER 1.69* 1.22*    Cardiac Enzymes No results for input(s): CKMB, TROPONINI, MYOGLOBIN in the last 168 hours.  Invalid input(s): CK ------------------------------------------------------------------------------------------------------------------    Component Value Date/Time   BNP 148.6 (H) 12/25/2019 0202    Inpatient Medications  Scheduled Meds: . vitamin C  500 mg Oral Daily  . aspirin EC  81 mg Oral QODAY  . dexamethasone (DECADRON) injection  6 mg Intravenous Q12H  . enoxaparin (LOVENOX) injection  1 mg/kg Subcutaneous Q12H  . famotidine  20 mg Oral BID  . insulin aspart  0-15 Units Subcutaneous TID WC  . insulin aspart  0-5 Units Subcutaneous QHS  . insulin detemir  5 Units Subcutaneous Daily  . Ipratropium-Albuterol  2 puff Inhalation Q6H  . linagliptin  5 mg Oral Daily  . phosphorus  500 mg Oral BID  . zinc sulfate  220 mg Oral Daily    Continuous Infusions: . cefTRIAXone (ROCEPHIN)  IV Stopped (12/25/19 2100)   PRN Meds:.chlorpheniramine-HYDROcodone, guaiFENesin-dextromethorphan  Micro Results No results found for this or any previous visit (from the past 240 hour(s)).  Radiology Reports CT ANGIO CHEST PE W OR WO CONTRAST  Result Date: 12/23/2019 CLINICAL DATA:  Shortness of breath. Coronavirus exposure in late December. Fever and shortness of breath. EXAM: CT ANGIOGRAPHY CHEST WITH CONTRAST TECHNIQUE: Multidetector CT imaging of the chest was performed using the standard protocol during bolus administration of intravenous contrast. Multiplanar CT image reconstructions and MIPs were obtained to evaluate the vascular anatomy. CONTRAST:  46mL OMNIPAQUE IOHEXOL 350 MG/ML SOLN COMPARISON:  Chest radiography yesterday. FINDINGS: Cardiovascular: Pulmonary arterial opacification is good. No pulmonary emboli. The aorta shows advanced atherosclerotic disease with irregular plaque. No aneurysm or dissection. Heart size is normal. There is coronary artery calcification. Mediastinum/Nodes: No mediastinal mass or adenopathy. Small mediastinal lymph nodes are present. Single exception to this is a right paratracheal node in the upper chest measuring 1.4 cm in diameter. Lungs/Pleura: Widespread bilateral hazy mid and lower lung pulmonary infiltrates typical of coronavirus infection. No dense consolidation, collapse or effusion. No sign of underlying lung lesion. Upper Abdomen: Negative Musculoskeletal: Ordinary degenerative changes affect the spine. Review  of the MIP images confirms the above findings. IMPRESSION: Widespread bilateral pulmonary infiltrates which are hazy and lower chest predominant. Findings typical of coronavirus pneumonia. No dense consolidation, lobar collapse or pleural effusion. No pulmonary emboli. Considerable aortic atherosclerosis.  Coronary artery calcification. Electronically Signed   By: Nelson Chimes M.D.   On:  12/23/2019 15:46   DG Chest Portable 1 View  Result Date: 12/26/2019 CLINICAL DATA:  Cough and shortness of breath. EXAM: PORTABLE CHEST 1 VIEW COMPARISON:  Chest x-ray dated January 08, 2019. FINDINGS: The heart size and mediastinal contours are within normal limits. Normal pulmonary vascularity. Patchy opacities at the left lung base. No pleural effusion or pneumothorax. No acute osseous abnormality. IMPRESSION: 1. Left lower lobe pneumonia. Electronically Signed   By: Titus Dubin M.D.   On: 12/29/2019 10:41      Phillips Climes M.D on 12/26/2019 at 3:33 PM  Between 7am to 7pm - Pager - [phone]  After 7pm go to [email or web address] - password [facility]  [facility] -  Office  [phone]

## 2019-12-26 NOTE — Progress Notes (Signed)
[place] for Lovenox Indication: atrial fibrillation  Allergies  Allergen Reactions  . Lisinopril Other (See Comments)    BP dropped very low    Patient Measurements: Height: 5\' 8"  (172.7 cm) Weight: 159 lb 3.2 oz (72.2 kg) IBW/kg (Calculated) : 68.4  Vital Signs: Temp: 97.8 F (36.6 C) (01/15 1203) Temp Source: Oral (01/15 1203) BP: 123/73 (01/15 1203) Pulse Rate: 94 (01/15 1203)  Labs: Recent Labs    12/24/19 1105 12/24/19 1105 12/25/19 0202 12/26/19 0148  HGB 13.5   < > 12.6* 11.7*  HCT 40.6  --  37.5* 35.1*  PLT 284  --  368 374  CREATININE 1.61*  --  1.55* 1.38*   < > = values in this interval not displayed.    Estimated Creatinine Clearance: 42.7 mL/min (A) (by C-G formula based on SCr of 1.38 mg/dL (H)).   Medical History: Past Medical History:  Diagnosis Date  . Acquired hypothyroidism   . Arthritis   . Cervical radiculopathy    "fingers get cold and my neck pops when i turn it dide to side, theyre going to send me for a nerve induction next month"  . Chronic kidney disease    "they told me a couple years ago  to avoid taking ibuprofen because my kidneys werent 100%"  . Environmental allergies   . Hypertension   . Neuropathy    "ive got numbness in my left foot "   . Type 2 diabetes mellitus (HCC)     Medications: See med list  Assessment: 79 y/o M admitted with COVID-19 PNA. Patient has developed afib w/ RVR. Pharmacy is consulted for Lovenox. Patient has a h/o CKD with SCr near baseline yesterday, improved from admission. Awaiting labs today.   CHA2DS2-VASc Score =  4  SCr continues to improve   Goal of Therapy:  Anti-Xa level 0.6-1 units/ml 4hrs after LMWH dose given Monitor platelets by anticoagulation protocol: Yes   Plan:  -Continue Lovenox 70 mg (1 mg/kg) q 12 hours -Renal function improving steadily. Will proceed without levels -F/U plans for transition to oral agent -Will sign off and  monitor remotely  Thank you for the consult.   Ulice Dash D 12/26/2019,12:44 PM

## 2019-12-27 ENCOUNTER — Inpatient Hospital Stay (HOSPITAL_COMMUNITY): Payer: PPO

## 2019-12-27 LAB — GLUCOSE, CAPILLARY
Glucose-Capillary: 176 mg/dL — ABNORMAL HIGH (ref 70–99)
Glucose-Capillary: 279 mg/dL — ABNORMAL HIGH (ref 70–99)
Glucose-Capillary: 195 mg/dL — ABNORMAL HIGH (ref 70–99)

## 2019-12-27 LAB — D-DIMER, QUANTITATIVE: D-Dimer, Quant: 2.04 ug/mL-FEU — ABNORMAL HIGH (ref 0.00–0.50)

## 2019-12-27 LAB — COMPREHENSIVE METABOLIC PANEL
ALT: 105 U/L — ABNORMAL HIGH (ref 0–44)
AST: 78 U/L — ABNORMAL HIGH (ref 15–41)
Albumin: 3.1 g/dL — ABNORMAL LOW (ref 3.5–5.0)
Alkaline Phosphatase: 118 U/L (ref 38–126)
Anion gap: 15 (ref 5–15)
BUN: 45 mg/dL — ABNORMAL HIGH (ref 8–23)
CO2: 22 mmol/L (ref 22–32)
Calcium: 8.7 mg/dL — ABNORMAL LOW (ref 8.9–10.3)
Chloride: 104 mmol/L (ref 98–111)
Creatinine, Ser: 1.3 mg/dL — ABNORMAL HIGH (ref 0.61–1.24)
GFR calc Af Amer: >60 mL/min (ref >60)
GFR calc non Af Amer: 52 mL/min — ABNORMAL LOW (ref >60)
Glucose, Bld: 152 mg/dL — ABNORMAL HIGH (ref 70–99)
Potassium: 4 mmol/L (ref 3.5–5.1)
Sodium: 141 mmol/L (ref 135–145)
Total Bilirubin: 0.5 mg/dL (ref 0.3–1.2)
Total Protein: 7 g/dL (ref 6.5–8.1)

## 2019-12-27 LAB — CBC WITH DIFFERENTIAL/PLATELET
Abs Immature Granulocytes: 1.14 10*3/uL — ABNORMAL HIGH (ref 0.00–0.07)
Basophils Absolute: 0.2 10*3/uL — ABNORMAL HIGH (ref 0.0–0.1)
Basophils Relative: 1 %
Eosinophils Absolute: 0 10*3/uL (ref 0.0–0.5)
Eosinophils Relative: 0 %
HCT: 39.3 % (ref 39.0–52.0)
Hemoglobin: 13.1 g/dL (ref 13.0–17.0)
Immature Granulocytes: 4 %
Lymphocytes Relative: 4 %
Lymphs Abs: 0.9 10*3/uL (ref 0.7–4.0)
MCH: 29.1 pg (ref 26.0–34.0)
MCHC: 33.3 g/dL (ref 30.0–36.0)
MCV: 87.3 fL (ref 80.0–100.0)
Monocytes Absolute: 1.1 10*3/uL — ABNORMAL HIGH (ref 0.1–1.0)
Monocytes Relative: 4 %
Neutro Abs: 23.4 10*3/uL — ABNORMAL HIGH (ref 1.7–7.7)
Neutrophils Relative %: 87 %
Platelets: 499 10*3/uL — ABNORMAL HIGH (ref 150–400)
RBC: 4.5 MIL/uL (ref 4.22–5.81)
RDW: 14.8 % (ref 11.5–15.5)
WBC: 26.7 10*3/uL — ABNORMAL HIGH (ref 4.0–10.5)
nRBC: 0 % (ref 0.0–0.2)

## 2019-12-27 LAB — MAGNESIUM: Magnesium: 2.2 mg/dL (ref 1.7–2.4)

## 2019-12-27 LAB — PROCALCITONIN: Procalcitonin: 1.45 ng/mL

## 2019-12-27 LAB — PHOSPHORUS: Phosphorus: 3.5 mg/dL (ref 2.5–4.6)

## 2019-12-27 LAB — C-REACTIVE PROTEIN: CRP: 5.7 mg/dL — ABNORMAL HIGH (ref <1.0)

## 2019-12-27 MED ORDER — HYDROXYZINE HCL 25 MG PO TABS
25.0000 mg | ORAL_TABLET | Freq: Once | ORAL | Status: AC
  Administered 2019-12-27: 25 mg via ORAL
  Filled 2019-12-27: qty 1

## 2019-12-27 MED ORDER — MELATONIN 5 MG PO TABS
5.0000 mg | ORAL_TABLET | Freq: Every evening | ORAL | Status: DC | PRN
  Administered 2019-12-27 – 2019-12-29 (×3): 5 mg via ORAL
  Filled 2019-12-27 (×4): qty 1

## 2019-12-27 NOTE — Progress Notes (Signed)
PROGRESS NOTE                                                                                                                                                                                                             Patient Demographics:    Terrence Cisneros, is a 79 y.o. male, DOB - 1941-01-01, [id number]  Admit date - 01/01/2020   Admitting Physician Reubin Milan, MD  Outpatient Primary MD for the patient is [name], MD  LOS - 5   Chief Complaint  Patient presents with  . Shortness of Breath  . Weakness       Brief Narrative    79 y.o.malewith medical history significant ofacquired hypothyroidism, osteoarthritis, radiculopathy, chronic kidney disease, environmental allergies, hypertension, neuropathy, type 2 diabetes mellitus who is coming to the emergency department due to viral symptoms associated with progressively worse dyspneafor about 10 daysand history of exposure to COVID-19around Christmas with her causing the later tested positive for SARS 2 coronavirus,, patient was noted to be hypoxic, transferred  to [facility] for further management.   Subjective:    Terrence Cisneros reports dyspnea and cough at baseline, patient had some hypoxia overnight where he required intermittent NRB on top of his 15 L high flow nasal cannula.   Assessment  & Plan :    Principal Problem:   Pneumonia due to COVID-19 virus Active Problems:   Essential hypertension   Type 2 diabetes mellitus with hyperglycemia, without long-term current use of insulin (HCC)   AKI (acute kidney injury) ([place])   Lactic acidosis  Acute hypoxic respiratory failure due to COVID-19 pneumonia. -Patient with significant oxygen requirement this morning, he remains on 15 L high flow nasal cannula today, did require some NRB overnight intermittently, but this morning he is back to baseline on 15 L high flow nasal cannula. -Was encouraged use incentive spirometry,  flutter valve, out of bed to chair, and to prone once possible -Continue with IV steroids.  Increased his dose to 6 mg IV twice daily given worsening hypoxia -Treated with IV remdesivir -Received convalescent plasma 1/14 -Not a candidate for Actemra given elevated procalcitonin at 37 on admission. -Treated with  IV Rocephin and azithromycin given elevated pro-Cal, and to cover for bacterial pneumonia. -Continue to trend inflammatory markers closely. -Leukocytosis most likely related to steroids, especially with procalcitonin  trending down  COVID-19 Labs  Recent Labs    12/25/19 0202 12/26/19 0148 12/27/19 0436  DDIMER 1.69* 1.22* 2.04*  CRP 12.4* 6.5* 5.7*    Lab Results  Component Value Date   SARSCOV2NAA NEGATIVE 06/12/2019   A. fib with RVR -New onset, Coreg amiodarone drip briefly, then converted to normal sinus rhythm. -started on full dose Lovenox for anticoagulation  Type 2 diabetes mellitus with hyperglycemia, without long-term current use of insulin (HCC) -Continue with insulin sliding scale, remains elevated, started on Levemir 5 units  Essential hypertension - Hold Hyzaar and hydrochlorothiazide due to renal function.  Soft blood pressure initially, now blood pressure started to increase he was started on low-dose metoprolol .  AKI (acute kidney injury) ([place]) -Losartan and hydrochlorothiazide especially in the setting of low blood pressure -improving  Hypophosphatemia -Repleted  Code Status : Full  Family Communication  : Discussed with wife via phone 1/16  Disposition Plan  : Home  Consults  :  None  Procedures  : None  DVT Prophylaxis  :  [place] lovenox  Lab Results  Component Value Date   PLT 499 (H) 12/27/2019    Antibiotics  :    Anti-infectives (From admission, onward)   Start     Dose/Rate Route Frequency Ordered Stop   12/23/19 1000  remdesivir 100 mg in sodium chloride 0.9 % 100 mL IVPB     100 mg 200 mL/hr over 30 Minutes Intravenous  Daily 12/24/2019 1313 12/26/19 1100   12/21/2019 2000  cefTRIAXone (ROCEPHIN) 2 g in sodium chloride 0.9 % 100 mL IVPB     2 g 200 mL/hr over 30 Minutes Intravenous Every 24 hours 12/20/2019 1853 12/26/19 2015   12/17/2019 2000  azithromycin (ZITHROMAX) tablet 500 mg     500 mg Oral Daily 01/04/2020 1853 12/26/19 0914   01/09/2020 1400  remdesivir 200 mg in sodium chloride 0.9% 250 mL IVPB     200 mg 580 mL/hr over 30 Minutes Intravenous Once 12/23/2019 1313 01/10/2020 1616        Objective:   Vitals:   12/27/19 0413 12/27/19 0735 12/27/19 1148 12/27/19 1619  BP: (!) 144/77 138/79 134/68 132/83  Pulse: (!) 104 (!) 104 100 (!) 110  Resp: 20 20 16 18   Temp: 97.7 F (36.5 C) 97.9 F (36.6 C) (!) 97.4 F (36.3 C) 98.4 F (36.9 C)  TempSrc: Oral Oral Oral Oral  SpO2: 92% 96% 95% 95%  Weight:      Height:        Wt Readings from Last 3 Encounters:  01/11/2020 72.2 kg  06/16/19 76.2 kg  06/11/19 76.2 kg     Intake/Output Summary (Last 24 hours) at 12/27/2019 1712 Last data filed at 12/27/2019 1300 Gross per 24 hour  Intake 360 ml  Output --  Net 360 ml     Physical Exam  Awake Alert, Oriented X 3, No new F.N deficits, Normal affect Symmetrical Chest wall movement, Good air movement bilaterally, B/L rales RRR,No Gallops,Rubs or new Murmurs, No Parasternal Heave +ve B.Sounds, Abd Soft, No tenderness, No rebound - guarding or rigidity. No Cyanosis, Clubbing or edema, No new Rash or bruise        Data Review:    CBC Recent Labs  Lab 12/23/19 0352 12/24/19 1105 12/25/19 0202 12/26/19 0148 12/27/19 0436  WBC 13.1* 17.3* 17.5* 21.3* 26.7*  HGB 12.2* 13.5 12.6* 11.7* 13.1  HCT 36.9* 40.6 37.5* 35.1* 39.3  PLT 248 284 368 374 499*  MCV 88.7 88.6 87.4 87.8 87.3  MCH 29.3 29.5 29.4 29.3 29.1  MCHC 33.1 33.3 33.6 33.3 33.3  RDW 13.9 14.6 14.5 14.6 14.8  LYMPHSABS 0.8 0.8 0.7 0.7 0.9  MONOABS 0.8 0.8 0.7 1.1* 1.1*  EOSABS 0.0 0.0 0.0 0.0 0.0  BASOSABS 0.0 0.0 0.1 0.1 0.2*     Chemistries  Recent Labs  Lab 12/23/19 0352 12/24/19 1105 12/25/19 0202 12/26/19 0148 12/27/19 0436  NA 134* 138 136 140 141  K 3.4* 4.0 3.6 4.2 4.0  CL 98 105 103 107 104  CO2 23 19* 21* 23 22  GLUCOSE 209* 240* 276* 168* 152*  BUN 55* 48* 47* 42* 45*  CREATININE 1.83* 1.61* 1.55* 1.38* 1.30*  CALCIUM 8.4* 8.2* 8.4* 8.9 8.7*  MG 2.2 2.2 2.3 2.2 2.2  AST 47* 42* 37 42* 78*  ALT 61* 43 41 46* 105*  ALKPHOS 59 62 66 71 118  BILITOT 0.1* 1.0 0.1* 0.2* 0.5   ------------------------------------------------------------------------------------------------------------------ No results for input(s): CHOL, HDL, LDLCALC, TRIG, CHOLHDL, LDLDIRECT in the last 72 hours.  Lab Results  Component Value Date   HGBA1C 7.4 (H) 12/23/2019   ------------------------------------------------------------------------------------------------------------------ No results for input(s): TSH, T4TOTAL, T3FREE, THYROIDAB in the last 72 hours.  Invalid input(s): FREET3 ------------------------------------------------------------------------------------------------------------------ No results for input(s): VITAMINB12, FOLATE, FERRITIN, TIBC, IRON, RETICCTPCT in the last 72 hours.  Coagulation profile No results for input(s): INR, PROTIME in the last 168 hours.  Recent Labs    12/26/19 0148 12/27/19 0436  DDIMER 1.22* 2.04*    Cardiac Enzymes No results for input(s): CKMB, TROPONINI, MYOGLOBIN in the last 168 hours.  Invalid input(s): CK ------------------------------------------------------------------------------------------------------------------    Component Value Date/Time   BNP 148.6 (H) 12/25/2019 0202    Inpatient Medications  Scheduled Meds: . vitamin C  500 mg Oral Daily  . aspirin EC  81 mg Oral QODAY  . dexamethasone (DECADRON) injection  6 mg Intravenous Q12H  . enoxaparin (LOVENOX) injection  1 mg/kg Subcutaneous Q12H  . famotidine  20 mg Oral BID  . insulin aspart   0-15 Units Subcutaneous TID WC  . insulin aspart  0-5 Units Subcutaneous QHS  . insulin detemir  5 Units Subcutaneous Daily  . Ipratropium-Albuterol  2 puff Inhalation Q6H  . linagliptin  5 mg Oral Daily  . phosphorus  500 mg Oral BID  . zinc sulfate  220 mg Oral Daily   Continuous Infusions:  PRN Meds:.chlorpheniramine-HYDROcodone, guaiFENesin-dextromethorphan  Micro Results No results found for this or any previous visit (from the past 240 hour(s)).  Radiology Reports CT ANGIO CHEST PE W OR WO CONTRAST  Result Date: 12/23/2019 CLINICAL DATA:  Shortness of breath. Coronavirus exposure in late December. Fever and shortness of breath. EXAM: CT ANGIOGRAPHY CHEST WITH CONTRAST TECHNIQUE: Multidetector CT imaging of the chest was performed using the standard protocol during bolus administration of intravenous contrast. Multiplanar CT image reconstructions and MIPs were obtained to evaluate the vascular anatomy. CONTRAST:  67mL OMNIPAQUE IOHEXOL 350 MG/ML SOLN COMPARISON:  Chest radiography yesterday. FINDINGS: Cardiovascular: Pulmonary arterial opacification is good. No pulmonary emboli. The aorta shows advanced atherosclerotic disease with irregular plaque. No aneurysm or dissection. Heart size is normal. There is coronary artery calcification. Mediastinum/Nodes: No mediastinal mass or adenopathy. Small mediastinal lymph nodes are present. Single exception to this is a right paratracheal node in the upper chest measuring 1.4 cm in diameter. Lungs/Pleura: Widespread bilateral hazy mid and lower lung pulmonary infiltrates typical of coronavirus infection. No dense consolidation, collapse or effusion.  No sign of underlying lung lesion. Upper Abdomen: Negative Musculoskeletal: Ordinary degenerative changes affect the spine. Review of the MIP images confirms the above findings. IMPRESSION: Widespread bilateral pulmonary infiltrates which are hazy and lower chest predominant. Findings typical of coronavirus  pneumonia. No dense consolidation, lobar collapse or pleural effusion. No pulmonary emboli. Considerable aortic atherosclerosis.  Coronary artery calcification. Electronically Signed   By: Nelson Chimes M.D.   On: 12/23/2019 15:46   DG Chest Port 1 View  Result Date: 12/27/2019 CLINICAL DATA:  Hypoxia. EXAM: PORTABLE CHEST 1 VIEW COMPARISON:  December 22, 2019. FINDINGS: The heart size and mediastinal contours are within normal limits. No pneumothorax or pleural effusion is noted. Increased bilateral basilar opacities are noted concerning for worsening infiltrates or atelectasis. The visualized skeletal structures are unremarkable. IMPRESSION: Increased bilateral basilar opacities are noted concerning for worsening infiltrates or atelectasis. Electronically Signed   By: Marijo Conception M.D.   On: 12/27/2019 11:54   DG Chest Portable 1 View  Result Date: 12/16/2019 CLINICAL DATA:  Cough and shortness of breath. EXAM: PORTABLE CHEST 1 VIEW COMPARISON:  Chest x-ray dated January 08, 2019. FINDINGS: The heart size and mediastinal contours are within normal limits. Normal pulmonary vascularity. Patchy opacities at the left lung base. No pleural effusion or pneumothorax. No acute osseous abnormality. IMPRESSION: 1. Left lower lobe pneumonia. Electronically Signed   By: Titus Dubin M.D.   On: 12/21/2019 10:41      Phillips Climes M.D on 12/27/2019 at 5:12 PM  Between 7am to 7pm - Pager - [phone]  After 7pm go to [email or web address] - password [facility]  [facility] -  Office  [phone]

## 2019-12-28 LAB — CBC WITH DIFFERENTIAL/PLATELET
Abs Immature Granulocytes: 0.88 10*3/uL — ABNORMAL HIGH (ref 0.00–0.07)
Basophils Absolute: 0.1 10*3/uL (ref 0.0–0.1)
Basophils Relative: 1 %
Eosinophils Absolute: 0 10*3/uL (ref 0.0–0.5)
Eosinophils Relative: 0 %
HCT: 34.9 % — ABNORMAL LOW (ref 39.0–52.0)
Hemoglobin: 11.8 g/dL — ABNORMAL LOW (ref 13.0–17.0)
Immature Granulocytes: 4 %
Lymphocytes Relative: 2 %
Lymphs Abs: 0.5 10*3/uL — ABNORMAL LOW (ref 0.7–4.0)
MCH: 29.2 pg (ref 26.0–34.0)
MCHC: 33.8 g/dL (ref 30.0–36.0)
MCV: 86.4 fL (ref 80.0–100.0)
Monocytes Absolute: 0.6 10*3/uL (ref 0.1–1.0)
Monocytes Relative: 3 %
Neutro Abs: 18.4 10*3/uL — ABNORMAL HIGH (ref 1.7–7.7)
Neutrophils Relative %: 90 %
Platelets: 412 10*3/uL — ABNORMAL HIGH (ref 150–400)
RBC: 4.04 MIL/uL — ABNORMAL LOW (ref 4.22–5.81)
RDW: 14.6 % (ref 11.5–15.5)
WBC: 20.5 10*3/uL — ABNORMAL HIGH (ref 4.0–10.5)
nRBC: 0 % (ref 0.0–0.2)

## 2019-12-28 LAB — COMPREHENSIVE METABOLIC PANEL
ALT: 96 U/L — ABNORMAL HIGH (ref 0–44)
AST: 63 U/L — ABNORMAL HIGH (ref 15–41)
Albumin: 2.9 g/dL — ABNORMAL LOW (ref 3.5–5.0)
Alkaline Phosphatase: 130 U/L — ABNORMAL HIGH (ref 38–126)
Anion gap: 12 (ref 5–15)
BUN: 44 mg/dL — ABNORMAL HIGH (ref 8–23)
CO2: 22 mmol/L (ref 22–32)
Calcium: 8.4 mg/dL — ABNORMAL LOW (ref 8.9–10.3)
Chloride: 106 mmol/L (ref 98–111)
Creatinine, Ser: 1.29 mg/dL — ABNORMAL HIGH (ref 0.61–1.24)
GFR calc Af Amer: >60 mL/min (ref >60)
GFR calc non Af Amer: 53 mL/min — ABNORMAL LOW (ref >60)
Glucose, Bld: 189 mg/dL — ABNORMAL HIGH (ref 70–99)
Potassium: 4 mmol/L (ref 3.5–5.1)
Sodium: 140 mmol/L (ref 135–145)
Total Bilirubin: 1 mg/dL (ref 0.3–1.2)
Total Protein: 6.3 g/dL — ABNORMAL LOW (ref 6.5–8.1)

## 2019-12-28 LAB — GLUCOSE, CAPILLARY
Glucose-Capillary: 211 mg/dL — ABNORMAL HIGH (ref 70–99)
Glucose-Capillary: 201 mg/dL — ABNORMAL HIGH (ref 70–99)
Glucose-Capillary: 165 mg/dL — ABNORMAL HIGH (ref 70–99)
Glucose-Capillary: 99 mg/dL (ref 70–99)

## 2019-12-28 LAB — D-DIMER, QUANTITATIVE: D-Dimer, Quant: 2.68 ug/mL-FEU — ABNORMAL HIGH (ref 0.00–0.50)

## 2019-12-28 LAB — MAGNESIUM: Magnesium: 2 mg/dL (ref 1.7–2.4)

## 2019-12-28 LAB — PHOSPHORUS: Phosphorus: 3.9 mg/dL (ref 2.5–4.6)

## 2019-12-28 LAB — C-REACTIVE PROTEIN: CRP: 4.9 mg/dL — ABNORMAL HIGH (ref <1.0)

## 2019-12-28 MED ORDER — INSULIN ASPART 100 UNIT/ML ~~LOC~~ SOLN
0.0000 [IU] | Freq: Three times a day (TID) | SUBCUTANEOUS | Status: DC
  Administered 2019-12-28: 4 [IU] via SUBCUTANEOUS
  Administered 2019-12-29: 3 [IU] via SUBCUTANEOUS
  Administered 2019-12-30: 17:00:00 7 [IU] via SUBCUTANEOUS
  Administered 2019-12-30: 13:00:00 4 [IU] via SUBCUTANEOUS
  Administered 2019-12-31: 11 [IU] via SUBCUTANEOUS
  Administered 2019-12-31: 4 [IU] via SUBCUTANEOUS

## 2019-12-28 MED ORDER — INSULIN DETEMIR 100 UNIT/ML ~~LOC~~ SOLN
12.0000 [IU] | Freq: Every day | SUBCUTANEOUS | Status: DC
  Administered 2019-12-29: 12 [IU] via SUBCUTANEOUS
  Filled 2019-12-28: qty 0.12

## 2019-12-28 MED ORDER — DEXAMETHASONE SODIUM PHOSPHATE 10 MG/ML IJ SOLN
6.0000 mg | Freq: Every day | INTRAMUSCULAR | Status: DC
  Administered 2019-12-29: 6 mg via INTRAVENOUS
  Filled 2019-12-28: qty 1

## 2019-12-28 MED ORDER — HYDROCOD POLST-CPM POLST ER 10-8 MG/5ML PO SUER: 2.5000 mL | Freq: Two times a day (BID) | ORAL | Status: DC | PRN

## 2019-12-28 MED ORDER — INSULIN DETEMIR 100 UNIT/ML ~~LOC~~ SOLN
7.0000 [IU] | Freq: Once | SUBCUTANEOUS | Status: AC
  Administered 2019-12-28: 7 [IU] via SUBCUTANEOUS
  Filled 2019-12-28: qty 0.07

## 2019-12-28 MED ORDER — METOPROLOL TARTRATE 25 MG PO TABS
25.0000 mg | ORAL_TABLET | Freq: Two times a day (BID) | ORAL | Status: DC
  Administered 2019-12-28 – 2019-12-29 (×3): 25 mg via ORAL
  Filled 2019-12-28 (×3): qty 1

## 2019-12-28 NOTE — Progress Notes (Signed)
Ok to increase SSI to resistant scale per Dr Waldron Labs.  Onnie Boer, PharmD, BCIDP, AAHIVP, CPP Infectious Disease Pharmacist 12/28/2019 2:33 PM

## 2019-12-28 NOTE — Progress Notes (Signed)
PROGRESS NOTE                                                                                                                                                                                                             Patient Demographics:    Terrence Cisneros, is a 79 y.o. male, DOB - October 25, 1941, [id number]  Admit date - 12/13/2019   Admitting Physician Reubin Milan, MD  Outpatient Primary MD for the patient is [name], MD  LOS - 6   Chief Complaint  Patient presents with  . Shortness of Breath  . Weakness       Brief Narrative    79 y.o.malewith medical history significant ofacquired hypothyroidism, osteoarthritis, radiculopathy, chronic kidney disease, environmental allergies, hypertension, neuropathy, type 2 diabetes mellitus who is coming to the emergency department due to viral symptoms associated with progressively worse dyspneafor about 10 daysand history of exposure to COVID-19around Christmas with her causing the later tested positive for SARS 2 coronavirus,, patient was noted to be hypoxic, transferred  to [facility] for further management.   Subjective:    Terrence Cisneros reports dyspnea and cough at baseline, has any fever, denies any chest pain.    Assessment  & Plan :    Principal Problem:   Pneumonia due to COVID-19 virus Active Problems:   Essential hypertension   Type 2 diabetes mellitus with hyperglycemia, without long-term current use of insulin (HCC)   AKI (acute kidney injury) ([place])   Lactic acidosis  Acute hypoxic respiratory failure due to COVID-19 pneumonia. -Patient with significant oxygen requirement this morning, he remains on 15 L high flow nasal cannula today, his oxygen requirement has improved today, he is 10 to 12 L high flow nasal cannula this morning. -Was encouraged use incentive spirometry, flutter valve, out of bed to chair, and to prone once possible -Continue with IV steroids.    -Treated with IV remdesivir -Received convalescent plasma 1/14 -Not a candidate for Actemra given elevated procalcitonin at 37 on admission. -Treated with 5 days IV Rocephin and azithromycin given elevated pro-Cal, and to cover for bacterial pneumonia. -Continue to trend inflammatory markers closely. -Leukocytosis most likely related to steroids, especially with procalcitonin trending down  COVID-19 Labs  Recent Labs    12/26/19 0148 12/27/19 0436 12/28/19 0326  DDIMER 1.22* 2.04* 2.68*  CRP 6.5*  5.7* 4.9*    Lab Results  Component Value Date   SARSCOV2NAA NEGATIVE 06/12/2019   A. fib with RVR -New onset, Coreg amiodarone drip briefly, then converted to normal sinus rhythm.  He is having some tachycardia today, will start on low-dose metoprolol -started on full dose Lovenox for anticoagulation  Type 2 diabetes mellitus with hyperglycemia, without long-term current use of insulin (HCC) -Continue with insulin sliding scale, remains elevated, started on Levemir 5 units  Essential hypertension - Hold Hyzaar and hydrochlorothiazide due to renal function.  Soft blood pressure initially, now blood pressure started to increase he was started on low-dose metoprolol .  AKI (acute kidney injury) ([place]) -Losartan and hydrochlorothiazide especially in the setting of low blood pressure -improving  Hypophosphatemia -Repleted  Code Status : Full  Family Communication  : Discussed with wife via phone 1/16  Disposition Plan  : Home  Consults  :  None  Procedures  : None  DVT Prophylaxis  :  [place] lovenox  Lab Results  Component Value Date   PLT 412 (H) 12/28/2019    Antibiotics  :    Anti-infectives (From admission, onward)   Start     Dose/Rate Route Frequency Ordered Stop   12/23/19 1000  remdesivir 100 mg in sodium chloride 0.9 % 100 mL IVPB     100 mg 200 mL/hr over 30 Minutes Intravenous Daily 12/21/2019 1313 12/26/19 1100   12/19/2019 2000  cefTRIAXone (ROCEPHIN) 2 g in  sodium chloride 0.9 % 100 mL IVPB     2 g 200 mL/hr over 30 Minutes Intravenous Every 24 hours 12/20/2019 1853 12/26/19 2015   12/31/2019 2000  azithromycin (ZITHROMAX) tablet 500 mg     500 mg Oral Daily 01/05/2020 1853 12/26/19 0914   12/15/2019 1400  remdesivir 200 mg in sodium chloride 0.9% 250 mL IVPB     200 mg 580 mL/hr over 30 Minutes Intravenous Once 12/23/2019 1313 12/31/2019 1616        Objective:   Vitals:   12/28/19 0007 12/28/19 0509 12/28/19 0750 12/28/19 1141  BP: (!) 148/85 (!) 151/89 123/82 129/83  Pulse: (!) 115 (!) 115 (!) 107 86  Resp: 20 19 20 18   Temp: 97.6 F (36.4 C) (!) 97.4 F (36.3 C) 97.6 F (36.4 C) 97.8 F (36.6 C)  TempSrc: Oral Oral Oral Oral  SpO2: 99% 95% 98% 97%  Weight:      Height:        Wt Readings from Last 3 Encounters:  12/27/2019 72.2 kg  06/16/19 76.2 kg  06/11/19 76.2 kg     Intake/Output Summary (Last 24 hours) at 12/28/2019 1508 Last data filed at 12/27/2019 1907 Gross per 24 hour  Intake 360 ml  Output --  Net 360 ml     Physical Exam  Awake Alert, Oriented X 3, No new F.N deficits, Normal affect Symmetrical Chest wall movement, Good air movement bilaterally, CTAB RRR,No Gallops,Rubs or new Murmurs, No Parasternal Heave +ve B.Sounds, Abd Soft, No tenderness, No rebound - guarding or rigidity. No Cyanosis, Clubbing or edema, No new Rash or bruise       Data Review:    CBC Recent Labs  Lab 12/24/19 1105 12/25/19 0202 12/26/19 0148 12/27/19 0436 12/28/19 0326  WBC 17.3* 17.5* 21.3* 26.7* 20.5*  HGB 13.5 12.6* 11.7* 13.1 11.8*  HCT 40.6 37.5* 35.1* 39.3 34.9*  PLT 284 368 374 499* 412*  MCV 88.6 87.4 87.8 87.3 86.4  MCH 29.5 29.4 29.3 29.1 29.2  MCHC 33.3 33.6 33.3 33.3 33.8  RDW 14.6 14.5 14.6 14.8 14.6  LYMPHSABS 0.8 0.7 0.7 0.9 0.5*  MONOABS 0.8 0.7 1.1* 1.1* 0.6  EOSABS 0.0 0.0 0.0 0.0 0.0  BASOSABS 0.0 0.1 0.1 0.2* 0.1    Chemistries  Recent Labs  Lab 12/24/19 1105 12/25/19 0202 12/26/19 0148  12/27/19 0436 12/28/19 0326  NA 138 136 140 141 140  K 4.0 3.6 4.2 4.0 4.0  CL 105 103 107 104 106  CO2 19* 21* 23 22 22   GLUCOSE 240* 276* 168* 152* 189*  BUN 48* 47* 42* 45* 44*  CREATININE 1.61* 1.55* 1.38* 1.30* 1.29*  CALCIUM 8.2* 8.4* 8.9 8.7* 8.4*  MG 2.2 2.3 2.2 2.2 2.0  AST 42* 37 42* 78* 63*  ALT 43 41 46* 105* 96*  ALKPHOS 62 66 71 118 130*  BILITOT 1.0 0.1* 0.2* 0.5 1.0   ------------------------------------------------------------------------------------------------------------------ No results for input(s): CHOL, HDL, LDLCALC, TRIG, CHOLHDL, LDLDIRECT in the last 72 hours.  Lab Results  Component Value Date   HGBA1C 7.4 (H) 12/23/2019   ------------------------------------------------------------------------------------------------------------------ No results for input(s): TSH, T4TOTAL, T3FREE, THYROIDAB in the last 72 hours.  Invalid input(s): FREET3 ------------------------------------------------------------------------------------------------------------------ No results for input(s): VITAMINB12, FOLATE, FERRITIN, TIBC, IRON, RETICCTPCT in the last 72 hours.  Coagulation profile No results for input(s): INR, PROTIME in the last 168 hours.  Recent Labs    12/27/19 0436 12/28/19 0326  DDIMER 2.04* 2.68*    Cardiac Enzymes No results for input(s): CKMB, TROPONINI, MYOGLOBIN in the last 168 hours.  Invalid input(s): CK ------------------------------------------------------------------------------------------------------------------    Component Value Date/Time   BNP 148.6 (H) 12/25/2019 0202    Inpatient Medications  Scheduled Meds: . vitamin C  500 mg Oral Daily  . aspirin EC  81 mg Oral QODAY  . dexamethasone (DECADRON) injection  6 mg Intravenous Q12H  . enoxaparin (LOVENOX) injection  1 mg/kg Subcutaneous Q12H  . famotidine  20 mg Oral BID  . insulin aspart  0-20 Units Subcutaneous TID WC  . insulin aspart  0-5 Units Subcutaneous QHS  .  insulin detemir  5 Units Subcutaneous Daily  . Ipratropium-Albuterol  2 puff Inhalation Q6H  . linagliptin  5 mg Oral Daily  . metoprolol tartrate  25 mg Oral BID  . zinc sulfate  220 mg Oral Daily   Continuous Infusions:  PRN Meds:.chlorpheniramine-HYDROcodone, guaiFENesin-dextromethorphan, Melatonin  Micro Results No results found for this or any previous visit (from the past 240 hour(s)).  Radiology Reports CT ANGIO CHEST PE W OR WO CONTRAST  Result Date: 12/23/2019 CLINICAL DATA:  Shortness of breath. Coronavirus exposure in late December. Fever and shortness of breath. EXAM: CT ANGIOGRAPHY CHEST WITH CONTRAST TECHNIQUE: Multidetector CT imaging of the chest was performed using the standard protocol during bolus administration of intravenous contrast. Multiplanar CT image reconstructions and MIPs were obtained to evaluate the vascular anatomy. CONTRAST:  109mL OMNIPAQUE IOHEXOL 350 MG/ML SOLN COMPARISON:  Chest radiography yesterday. FINDINGS: Cardiovascular: Pulmonary arterial opacification is good. No pulmonary emboli. The aorta shows advanced atherosclerotic disease with irregular plaque. No aneurysm or dissection. Heart size is normal. There is coronary artery calcification. Mediastinum/Nodes: No mediastinal mass or adenopathy. Small mediastinal lymph nodes are present. Single exception to this is a right paratracheal node in the upper chest measuring 1.4 cm in diameter. Lungs/Pleura: Widespread bilateral hazy mid and lower lung pulmonary infiltrates typical of coronavirus infection. No dense consolidation, collapse or effusion. No sign of underlying lung lesion. Upper Abdomen: Negative Musculoskeletal: Ordinary degenerative  changes affect the spine. Review of the MIP images confirms the above findings. IMPRESSION: Widespread bilateral pulmonary infiltrates which are hazy and lower chest predominant. Findings typical of coronavirus pneumonia. No dense consolidation, lobar collapse or pleural  effusion. No pulmonary emboli. Considerable aortic atherosclerosis.  Coronary artery calcification. Electronically Signed   By: Nelson Chimes M.D.   On: 12/23/2019 15:46   DG Chest Port 1 View  Result Date: 12/27/2019 CLINICAL DATA:  Hypoxia. EXAM: PORTABLE CHEST 1 VIEW COMPARISON:  December 22, 2019. FINDINGS: The heart size and mediastinal contours are within normal limits. No pneumothorax or pleural effusion is noted. Increased bilateral basilar opacities are noted concerning for worsening infiltrates or atelectasis. The visualized skeletal structures are unremarkable. IMPRESSION: Increased bilateral basilar opacities are noted concerning for worsening infiltrates or atelectasis. Electronically Signed   By: Marijo Conception M.D.   On: 12/27/2019 11:54   DG Chest Portable 1 View  Result Date: 01/09/2020 CLINICAL DATA:  Cough and shortness of breath. EXAM: PORTABLE CHEST 1 VIEW COMPARISON:  Chest x-ray dated January 08, 2019. FINDINGS: The heart size and mediastinal contours are within normal limits. Normal pulmonary vascularity. Patchy opacities at the left lung base. No pleural effusion or pneumothorax. No acute osseous abnormality. IMPRESSION: 1. Left lower lobe pneumonia. Electronically Signed   By: Titus Dubin M.D.   On: 12/12/2019 10:41      Phillips Climes M.D on 12/28/2019 at 3:08 PM  Between 7am to 7pm - Pager - [phone]  After 7pm go to [email or web address] - password [facility]  [facility] -  Office  [phone]

## 2019-12-28 NOTE — Progress Notes (Signed)
Spoke with patient's wife, updated her on patient's condition and answered any questions.

## 2019-12-29 LAB — CBC WITH DIFFERENTIAL/PLATELET
Abs Immature Granulocytes: 1.54 10*3/uL — ABNORMAL HIGH (ref 0.00–0.07)
Basophils Absolute: 0.2 10*3/uL — ABNORMAL HIGH (ref 0.0–0.1)
Basophils Relative: 1 %
Eosinophils Absolute: 0 10*3/uL (ref 0.0–0.5)
Eosinophils Relative: 0 %
HCT: 38.7 % — ABNORMAL LOW (ref 39.0–52.0)
Hemoglobin: 13 g/dL (ref 13.0–17.0)
Immature Granulocytes: 6 %
Lymphocytes Relative: 4 %
Lymphs Abs: 1 10*3/uL (ref 0.7–4.0)
MCH: 29.6 pg (ref 26.0–34.0)
MCHC: 33.6 g/dL (ref 30.0–36.0)
MCV: 88.2 fL (ref 80.0–100.0)
Monocytes Absolute: 0.4 10*3/uL (ref 0.1–1.0)
Monocytes Relative: 2 %
Neutro Abs: 23.9 10*3/uL — ABNORMAL HIGH (ref 1.7–7.7)
Neutrophils Relative %: 87 %
Platelets: 525 10*3/uL — ABNORMAL HIGH (ref 150–400)
RBC: 4.39 MIL/uL (ref 4.22–5.81)
RDW: 15.1 % (ref 11.5–15.5)
WBC: 27.1 10*3/uL — ABNORMAL HIGH (ref 4.0–10.5)
nRBC: 0.1 % (ref 0.0–0.2)

## 2019-12-29 LAB — COMPREHENSIVE METABOLIC PANEL
ALT: 94 U/L — ABNORMAL HIGH (ref 0–44)
AST: 58 U/L — ABNORMAL HIGH (ref 15–41)
Albumin: 3.2 g/dL — ABNORMAL LOW (ref 3.5–5.0)
Alkaline Phosphatase: 179 U/L — ABNORMAL HIGH (ref 38–126)
Anion gap: 12 (ref 5–15)
BUN: 41 mg/dL — ABNORMAL HIGH (ref 8–23)
CO2: 24 mmol/L (ref 22–32)
Calcium: 8.7 mg/dL — ABNORMAL LOW (ref 8.9–10.3)
Chloride: 102 mmol/L (ref 98–111)
Creatinine, Ser: 1.21 mg/dL (ref 0.61–1.24)
GFR calc Af Amer: >60 mL/min (ref >60)
GFR calc non Af Amer: 57 mL/min — ABNORMAL LOW (ref >60)
Glucose, Bld: 63 mg/dL — ABNORMAL LOW (ref 70–99)
Potassium: 4.3 mmol/L (ref 3.5–5.1)
Sodium: 138 mmol/L (ref 135–145)
Total Bilirubin: 0.7 mg/dL (ref 0.3–1.2)
Total Protein: 6.9 g/dL (ref 6.5–8.1)

## 2019-12-29 LAB — GLUCOSE, CAPILLARY
Glucose-Capillary: 283 mg/dL — ABNORMAL HIGH (ref 70–99)
Glucose-Capillary: 221 mg/dL — ABNORMAL HIGH (ref 70–99)
Glucose-Capillary: 72 mg/dL (ref 70–99)
Glucose-Capillary: 112 mg/dL — ABNORMAL HIGH (ref 70–99)
Glucose-Capillary: 147 mg/dL — ABNORMAL HIGH (ref 70–99)
Glucose-Capillary: 81 mg/dL (ref 70–99)

## 2019-12-29 LAB — MAGNESIUM: Magnesium: 1.9 mg/dL (ref 1.7–2.4)

## 2019-12-29 LAB — PHOSPHORUS: Phosphorus: 2.9 mg/dL (ref 2.5–4.6)

## 2019-12-29 MED ORDER — INSULIN DETEMIR 100 UNIT/ML ~~LOC~~ SOLN
10.0000 [IU] | Freq: Every day | SUBCUTANEOUS | Status: DC
  Administered 2019-12-30: 10 [IU] via SUBCUTANEOUS
  Filled 2019-12-29: qty 0.1

## 2019-12-29 MED ORDER — METOPROLOL TARTRATE 25 MG PO TABS
37.5000 mg | ORAL_TABLET | Freq: Two times a day (BID) | ORAL | Status: DC
  Administered 2019-12-29 – 2019-12-30 (×2): 37.5 mg via ORAL
  Filled 2019-12-29 (×2): qty 2

## 2019-12-29 MED ORDER — TRAZODONE HCL 50 MG PO TABS
25.0000 mg | ORAL_TABLET | Freq: Every evening | ORAL | Status: DC | PRN
  Administered 2019-12-31 (×2): 25 mg via ORAL
  Filled 2019-12-29 (×2): qty 1

## 2019-12-29 NOTE — Progress Notes (Signed)
PROGRESS NOTE                                                                                                                                                                                                             Patient Demographics:    Terrence Cisneros, is a 79 y.o. male, DOB - 08-28-1941, [id number]  Admit date - 01/10/2020   Admitting Physician Reubin Milan, MD  Outpatient Primary MD for the patient is [name], MD  LOS - 7   Chief Complaint  Patient presents with  . Shortness of Breath  . Weakness       Brief Narrative    79 y.o.malewith medical history significant ofacquired hypothyroidism, osteoarthritis, radiculopathy, chronic kidney disease, environmental allergies, hypertension, neuropathy, type 2 diabetes mellitus who is coming to the emergency department due to viral symptoms associated with progressively worse dyspneafor about 10 daysand history of exposure to COVID-19around Christmas with her causing the later tested positive for SARS 2 coronavirus,, patient was noted to be hypoxic, transferred  to [facility] for further management.   Subjective:    Terrence Cisneros reports dyspnea and cough at baseline, has any fever, denies any chest pain.    Assessment  & Plan :    Principal Problem:   Pneumonia due to COVID-19 virus Active Problems:   Essential hypertension   Type 2 diabetes mellitus with hyperglycemia, without long-term current use of insulin (HCC)   AKI (acute kidney injury) ([place])   Lactic acidosis  Acute hypoxic respiratory failure due to COVID-19 pneumonia. -Patient with significant oxygen requirement this morning, he remains on 15 L high flow nasal cannula today, his oxygen requirement has improved today, he remains on 10 L nasal cannula today, but significantly dyspneic, becomes hypoxic with minimal activity -Was encouraged use incentive spirometry, flutter valve, out of bed to chair, and to prone  once possible -Continue with IV steroids.  -Treated with IV remdesivir -Received convalescent plasma 1/14 -Not a candidate for Actemra given elevated procalcitonin at 37 on admission. -Treated with 5 days IV Rocephin and azithromycin given elevated pro-Cal, and to cover for bacterial pneumonia. -Continue to trend inflammatory markers closely. -Leukocytosis most likely related to steroids, especially with procalcitonin trending down  COVID-19 Labs  Recent Labs    12/27/19 0436 12/28/19 0326  DDIMER 2.04* 2.68*  CRP 5.7*  4.9*    Lab Results  Component Value Date   SARSCOV2NAA NEGATIVE 06/12/2019   A. fib with RVR -New onset, initially on amiodarone drip given soft blood pressure, converted back to normal sinus rhythm . -Now started on metoprolol, will increase dose today given improved blood pressure, and still elevated heart rate . -started on full dose Lovenox for anticoagulation  Type 2 diabetes mellitus with hyperglycemia, without long-term current use of insulin (HCC) -Continue with insulin sliding scale, decrease Levemir to 10 units daily given low CBG this morning  Essential hypertension - Hold Hyzaar and hydrochlorothiazide due to renal function.  Blood pressure has improved started on metoprolol, will increase to 37.5 mg p.o. twice daily today .  AKI (acute kidney injury) ([place]) -Losartan and hydrochlorothiazide especially in the setting of low blood pressure -improving  Hypophosphatemia -Repleted  Code Status : Full  Family Communication  : Discussed with wife today  Disposition Plan  : Home  Consults  :  None  Procedures  : None  DVT Prophylaxis  :  [place] lovenox  Lab Results  Component Value Date   PLT 525 (H) 12/29/2019    Antibiotics  :    Anti-infectives (From admission, onward)   Start     Dose/Rate Route Frequency Ordered Stop   12/23/19 1000  remdesivir 100 mg in sodium chloride 0.9 % 100 mL IVPB     100 mg 200 mL/hr over 30 Minutes  Intravenous Daily 12/16/2019 1313 12/26/19 1100   12/29/2019 2000  cefTRIAXone (ROCEPHIN) 2 g in sodium chloride 0.9 % 100 mL IVPB     2 g 200 mL/hr over 30 Minutes Intravenous Every 24 hours 12/21/2019 1853 12/26/19 2015   12/17/2019 2000  azithromycin (ZITHROMAX) tablet 500 mg     500 mg Oral Daily 12/17/2019 1853 12/26/19 0914   01/05/2020 1400  remdesivir 200 mg in sodium chloride 0.9% 250 mL IVPB     200 mg 580 mL/hr over 30 Minutes Intravenous Once 01/02/2020 1313 01/03/2020 1616        Objective:   Vitals:   12/29/19 0400 12/29/19 0742 12/29/19 0835 12/29/19 1253  BP: 130/86 139/75 139/75   Pulse: (!) 104  (!) 104   Resp:    18  Temp: 98.6 F (37 C)   98.5 F (36.9 C)  TempSrc: Oral   Oral  SpO2: 97%     Weight:      Height:        Wt Readings from Last 3 Encounters:  12/24/2019 72.2 kg  06/16/19 76.2 kg  06/11/19 76.2 kg     Intake/Output Summary (Last 24 hours) at 12/29/2019 1528 Last data filed at 12/29/2019 0800 Gross per 24 hour  Intake --  Output 400 ml  Net -400 ml     Physical Exam  Awake Alert, Oriented X 3,frail,  No new F.N deficits, Normal affect Symmetrical Chest wall movement, Good air movement bilaterally, scattered rales RRR,No Gallops,Rubs or new Murmurs, No Parasternal Heave +ve B.Sounds, Abd Soft, No tenderness, No rebound - guarding or rigidity. No Cyanosis, Clubbing or edema, No new Rash or bruise        Data Review:    CBC Recent Labs  Lab 12/25/19 0202 12/26/19 0148 12/27/19 0436 12/28/19 0326 12/29/19 0650  WBC 17.5* 21.3* 26.7* 20.5* 27.1*  HGB 12.6* 11.7* 13.1 11.8* 13.0  HCT 37.5* 35.1* 39.3 34.9* 38.7*  PLT 368 374 499* 412* 525*  MCV 87.4 87.8 87.3 86.4 88.2  MCH 29.4  29.3 29.1 29.2 29.6  MCHC 33.6 33.3 33.3 33.8 33.6  RDW 14.5 14.6 14.8 14.6 15.1  LYMPHSABS 0.7 0.7 0.9 0.5* 1.0  MONOABS 0.7 1.1* 1.1* 0.6 0.4  EOSABS 0.0 0.0 0.0 0.0 0.0  BASOSABS 0.1 0.1 0.2* 0.1 0.2*    Chemistries  Recent Labs  Lab 12/25/19 0202  12/26/19 0148 12/27/19 0436 12/28/19 0326 12/29/19 0650  NA 136 140 141 140 138  K 3.6 4.2 4.0 4.0 4.3  CL 103 107 104 106 102  CO2 21* 23 22 22 24   GLUCOSE 276* 168* 152* 189* 63*  BUN 47* 42* 45* 44* 41*  CREATININE 1.55* 1.38* 1.30* 1.29* 1.21  CALCIUM 8.4* 8.9 8.7* 8.4* 8.7*  MG 2.3 2.2 2.2 2.0 1.9  AST 37 42* 78* 63* 58*  ALT 41 46* 105* 96* 94*  ALKPHOS 66 71 118 130* 179*  BILITOT 0.1* 0.2* 0.5 1.0 0.7   ------------------------------------------------------------------------------------------------------------------ No results for input(s): CHOL, HDL, LDLCALC, TRIG, CHOLHDL, LDLDIRECT in the last 72 hours.  Lab Results  Component Value Date   HGBA1C 7.4 (H) 12/23/2019   ------------------------------------------------------------------------------------------------------------------ No results for input(s): TSH, T4TOTAL, T3FREE, THYROIDAB in the last 72 hours.  Invalid input(s): FREET3 ------------------------------------------------------------------------------------------------------------------ No results for input(s): VITAMINB12, FOLATE, FERRITIN, TIBC, IRON, RETICCTPCT in the last 72 hours.  Coagulation profile No results for input(s): INR, PROTIME in the last 168 hours.  Recent Labs    12/27/19 0436 12/28/19 0326  DDIMER 2.04* 2.68*    Cardiac Enzymes No results for input(s): CKMB, TROPONINI, MYOGLOBIN in the last 168 hours.  Invalid input(s): CK ------------------------------------------------------------------------------------------------------------------    Component Value Date/Time   BNP 148.6 (H) 12/25/2019 0202    Inpatient Medications  Scheduled Meds: . vitamin C  500 mg Oral Daily  . aspirin EC  81 mg Oral QODAY  . dexamethasone (DECADRON) injection  6 mg Intravenous Daily  . enoxaparin (LOVENOX) injection  1 mg/kg Subcutaneous Q12H  . famotidine  20 mg Oral BID  . insulin aspart  0-20 Units Subcutaneous TID WC  . insulin aspart   0-5 Units Subcutaneous QHS  . [START ON 12/30/2019] insulin detemir  10 Units Subcutaneous Daily  . Ipratropium-Albuterol  2 puff Inhalation Q6H  . linagliptin  5 mg Oral Daily  . metoprolol tartrate  25 mg Oral BID  . zinc sulfate  220 mg Oral Daily   Continuous Infusions:  PRN Meds:.guaiFENesin-dextromethorphan, Melatonin  Micro Results No results found for this or any previous visit (from the past 240 hour(s)).  Radiology Reports CT ANGIO CHEST PE W OR WO CONTRAST  Result Date: 12/23/2019 CLINICAL DATA:  Shortness of breath. Coronavirus exposure in late December. Fever and shortness of breath. EXAM: CT ANGIOGRAPHY CHEST WITH CONTRAST TECHNIQUE: Multidetector CT imaging of the chest was performed using the standard protocol during bolus administration of intravenous contrast. Multiplanar CT image reconstructions and MIPs were obtained to evaluate the vascular anatomy. CONTRAST:  65mL OMNIPAQUE IOHEXOL 350 MG/ML SOLN COMPARISON:  Chest radiography yesterday. FINDINGS: Cardiovascular: Pulmonary arterial opacification is good. No pulmonary emboli. The aorta shows advanced atherosclerotic disease with irregular plaque. No aneurysm or dissection. Heart size is normal. There is coronary artery calcification. Mediastinum/Nodes: No mediastinal mass or adenopathy. Small mediastinal lymph nodes are present. Single exception to this is a right paratracheal node in the upper chest measuring 1.4 cm in diameter. Lungs/Pleura: Widespread bilateral hazy mid and lower lung pulmonary infiltrates typical of coronavirus infection. No dense consolidation, collapse or effusion. No sign of underlying lung  lesion. Upper Abdomen: Negative Musculoskeletal: Ordinary degenerative changes affect the spine. Review of the MIP images confirms the above findings. IMPRESSION: Widespread bilateral pulmonary infiltrates which are hazy and lower chest predominant. Findings typical of coronavirus pneumonia. No dense consolidation,  lobar collapse or pleural effusion. No pulmonary emboli. Considerable aortic atherosclerosis.  Coronary artery calcification. Electronically Signed   By: Nelson Chimes M.D.   On: 12/23/2019 15:46   DG Chest Port 1 View  Result Date: 12/27/2019 CLINICAL DATA:  Hypoxia. EXAM: PORTABLE CHEST 1 VIEW COMPARISON:  December 22, 2019. FINDINGS: The heart size and mediastinal contours are within normal limits. No pneumothorax or pleural effusion is noted. Increased bilateral basilar opacities are noted concerning for worsening infiltrates or atelectasis. The visualized skeletal structures are unremarkable. IMPRESSION: Increased bilateral basilar opacities are noted concerning for worsening infiltrates or atelectasis. Electronically Signed   By: Marijo Conception M.D.   On: 12/27/2019 11:54   DG Chest Portable 1 View  Result Date: 01/11/2020 CLINICAL DATA:  Cough and shortness of breath. EXAM: PORTABLE CHEST 1 VIEW COMPARISON:  Chest x-ray dated January 08, 2019. FINDINGS: The heart size and mediastinal contours are within normal limits. Normal pulmonary vascularity. Patchy opacities at the left lung base. No pleural effusion or pneumothorax. No acute osseous abnormality. IMPRESSION: 1. Left lower lobe pneumonia. Electronically Signed   By: Titus Dubin M.D.   On: 12/24/2019 10:41      Phillips Climes M.D on 12/29/2019 at 3:28 PM  Between 7am to 7pm - Pager - [phone]  After 7pm go to [email or web address] - password [facility]  [facility] -  Office  [phone]

## 2019-12-29 NOTE — Progress Notes (Signed)
Inpatient Diabetes Program Recommendations  AACE/ADA: New Consensus Statement on Inpatient Glycemic Control (2015)  Target Ranges:  Prepandial:   less than 140 mg/dL      Peak postprandial:   less than 180 mg/dL (1-2 hours)      Critically ill patients:  140 - 180 mg/dL   Lab Results  Component Value Date   GLUCAP 72 12/29/2019   HGBA1C 7.4 (H) 12/23/2019    Review of Glycemic Control Results for Terrence Cisneros, Terrence Cisneros" (MRN [id number]) as of 12/29/2019 11:16  Ref. Range 12/29/2019 06:50  Glucose Latest Ref Range: 70 - 99 mg/dL 63 (L)   Diabetes history: DM 2 Outpatient Diabetes medications: Metformin 500 mg BID  Current orders for Inpatient glycemic control:  Levemir 12 units Daily Novolog 0-20 units tid + hs Tradjenta 5 mg Daily  Decadron 6 mg Q24 hours BUN/Creat: 41/1.21  Inpatient Diabetes Program Recommendations:    Fasting glucose 72 this am, in labs 63 mg/dl. Please lower Levemir dose to 10 units.  Thanks, Tama Headings RN, MSN, BC-ADM Inpatient Diabetes Coordinator Team Pager [phone] (8a-5p)

## 2019-12-30 LAB — COMPREHENSIVE METABOLIC PANEL
ALT: 79 U/L — ABNORMAL HIGH (ref 0–44)
AST: 53 U/L — ABNORMAL HIGH (ref 15–41)
Albumin: 2.9 g/dL — ABNORMAL LOW (ref 3.5–5.0)
Alkaline Phosphatase: 192 U/L — ABNORMAL HIGH (ref 38–126)
Anion gap: 11 (ref 5–15)
BUN: 44 mg/dL — ABNORMAL HIGH (ref 8–23)
CO2: 21 mmol/L — ABNORMAL LOW (ref 22–32)
Calcium: 8.7 mg/dL — ABNORMAL LOW (ref 8.9–10.3)
Chloride: 104 mmol/L (ref 98–111)
Creatinine, Ser: 1.26 mg/dL — ABNORMAL HIGH (ref 0.61–1.24)
GFR calc Af Amer: >60 mL/min (ref >60)
GFR calc non Af Amer: 54 mL/min — ABNORMAL LOW (ref >60)
Glucose, Bld: 61 mg/dL — ABNORMAL LOW (ref 70–99)
Potassium: 4.7 mmol/L (ref 3.5–5.1)
Sodium: 136 mmol/L (ref 135–145)
Total Bilirubin: 0.9 mg/dL (ref 0.3–1.2)
Total Protein: 6.3 g/dL — ABNORMAL LOW (ref 6.5–8.1)

## 2019-12-30 LAB — CBC
HCT: 35.7 % — ABNORMAL LOW (ref 39.0–52.0)
Hemoglobin: 12 g/dL — ABNORMAL LOW (ref 13.0–17.0)
MCH: 29.2 pg (ref 26.0–34.0)
MCHC: 33.6 g/dL (ref 30.0–36.0)
MCV: 86.9 fL (ref 80.0–100.0)
Platelets: 404 10*3/uL — ABNORMAL HIGH (ref 150–400)
RBC: 4.11 MIL/uL — ABNORMAL LOW (ref 4.22–5.81)
RDW: 14.8 % (ref 11.5–15.5)
WBC: 22.8 10*3/uL — ABNORMAL HIGH (ref 4.0–10.5)
nRBC: 0 % (ref 0.0–0.2)

## 2019-12-30 LAB — GLUCOSE, CAPILLARY
Glucose-Capillary: 67 mg/dL — ABNORMAL LOW (ref 70–99)
Glucose-Capillary: 168 mg/dL — ABNORMAL HIGH (ref 70–99)
Glucose-Capillary: 212 mg/dL — ABNORMAL HIGH (ref 70–99)
Glucose-Capillary: 205 mg/dL — ABNORMAL HIGH (ref 70–99)

## 2019-12-30 LAB — PHOSPHORUS: Phosphorus: 4.1 mg/dL (ref 2.5–4.6)

## 2019-12-30 LAB — D-DIMER, QUANTITATIVE: D-Dimer, Quant: 6.68 ug/mL-FEU — ABNORMAL HIGH (ref 0.00–0.50)

## 2019-12-30 LAB — C-REACTIVE PROTEIN: CRP: 15.1 mg/dL — ABNORMAL HIGH (ref <1.0)

## 2019-12-30 MED ORDER — METOPROLOL TARTRATE 25 MG PO TABS
50.0000 mg | ORAL_TABLET | Freq: Two times a day (BID) | ORAL | Status: DC
  Administered 2019-12-30 – 2019-12-31 (×3): 50 mg via ORAL
  Filled 2019-12-30 (×3): qty 1

## 2019-12-30 MED ORDER — APIXABAN 5 MG PO TABS
5.0000 mg | ORAL_TABLET | Freq: Two times a day (BID) | ORAL | Status: DC
  Administered 2019-12-30 – 2020-01-03 (×9): 5 mg via ORAL
  Filled 2019-12-30 (×9): qty 1

## 2019-12-30 MED ORDER — INSULIN DETEMIR 100 UNIT/ML ~~LOC~~ SOLN
7.0000 [IU] | Freq: Every day | SUBCUTANEOUS | Status: DC
  Administered 2019-12-31 – 2020-01-06 (×7): 7 [IU] via SUBCUTANEOUS
  Filled 2019-12-30 (×9): qty 0.07

## 2019-12-30 MED ORDER — DEXAMETHASONE SODIUM PHOSPHATE 10 MG/ML IJ SOLN
10.0000 mg | Freq: Every day | INTRAMUSCULAR | Status: DC
  Administered 2019-12-30 – 2020-01-06 (×8): 10 mg via INTRAVENOUS
  Filled 2019-12-30 (×8): qty 1

## 2019-12-30 MED ORDER — PNEUMOCOCCAL VAC POLYVALENT 25 MCG/0.5ML IJ INJ
0.5000 mL | INJECTION | INTRAMUSCULAR | Status: DC
  Filled 2019-12-30: qty 0.5

## 2019-12-30 NOTE — Progress Notes (Signed)
PROGRESS NOTE                                                                                                                                                                                                             Patient Demographics:    Terrence Cisneros, is a 79 y.o. male, DOB - [birth year], [id number]  Admit date - 01/02/2020   Admitting Physician Reubin Milan, MD  Outpatient Primary MD for the patient is [name], MD  LOS - 8   Chief Complaint  Patient presents with  . Shortness of Breath  . Weakness       Brief Narrative    79 y.o.malewith medical history significant ofacquired hypothyroidism, osteoarthritis, radiculopathy, chronic kidney disease, environmental allergies, hypertension, neuropathy, type 2 diabetes mellitus who is coming to the emergency department due to viral symptoms associated with progressively worse dyspneafor about 10 daysand history of exposure to COVID-19around Christmas with her causing the later tested positive for SARS 2 coronavirus,, patient was noted to be hypoxic, transferred  to [facility] for further management.   Subjective:    Terrence Cisneros reports dyspnea, mainly on exertion, still reports cough, denies any chest pain or fever .   Assessment  & Plan :    Principal Problem:   Pneumonia due to COVID-19 virus Active Problems:   Essential hypertension   Type 2 diabetes mellitus with hyperglycemia, without long-term current use of insulin (HCC)   AKI (acute kidney injury) ([place])   Lactic acidosis  Acute hypoxic respiratory failure due to COVID-19 pneumonia. -Patient with significant oxygen requirement this morning, he did require 15 L high flow nasal cannula , had some improvement of oxygen requirement, he remains around 10 L high flow nasal cannula over the last 48 hours, but still significantly dyspneic, and hypoxic with minimal exertion. -Was encouraged use incentive spirometry, flutter  valve, out of bed to chair, and to prone once possible -Continue with IV steroids, I have increased his steroid dose to 10 mg IV Decadron given CRP went from 4.9-15.1 today. -Treated with IV remdesivir -Received convalescent plasma 1/14 -Not a candidate for Actemra given elevated procalcitonin at 37 on admission. -Treated with 5 days IV Rocephin and azithromycin given elevated pro-Cal, and to cover for bacterial pneumonia. -Continue to trend inflammatory markers closely. -Leukocytosis most likely related to steroids, especially with  procalcitonin trending down  COVID-19 Labs  Recent Labs    12/28/19 0326 12/30/19 0215  DDIMER 2.68* 6.68*  CRP 4.9* 15.1*    Lab Results  Component Value Date   SARSCOV2NAA NEGATIVE 06/12/2019   A. fib with RVR -New onset, initially on amiodarone drip given soft blood pressure, converted back to normal sinus rhythm . -Now started on metoprolol, had an increase today given blood pressure is elevated and heart rate is elevated as well, . -started on full dose Lovenox for anticoagulation, will transition to Eliquis  Type 2 diabetes mellitus with hyperglycemia, without long-term current use of insulin ([place]) -Continue with insulin sliding scale, lower his Levemir further to avoid hypoglycemia given CBG of 67 this morning.  Essential hypertension - Hold Hyzaar and hydrochlorothiazide due to renal function.   -Increase metoprolol to 50 mg oral twice daily today .  AKI (acute kidney injury) ([place]) -Losartan and hydrochlorothiazide especially in the setting of low blood pressure -improving  Hypophosphatemia -Repleted  Code Status : Full  Family Communication  : Discussed with wife 1/18  Disposition Plan  : Home  Consults  :  None  Procedures  : None  DVT Prophylaxis  :  [place] lovenox  Lab Results  Component Value Date   PLT 404 (H) 12/30/2019    Antibiotics  :    Anti-infectives (From admission, onward)   Start     Dose/Rate Route  Frequency Ordered Stop   12/23/19 1000  remdesivir 100 mg in sodium chloride 0.9 % 100 mL IVPB     100 mg 200 mL/hr over 30 Minutes Intravenous Daily 12/19/2019 1313 12/26/19 1100   01/03/2020 2000  cefTRIAXone (ROCEPHIN) 2 g in sodium chloride 0.9 % 100 mL IVPB     2 g 200 mL/hr over 30 Minutes Intravenous Every 24 hours 01/09/2020 1853 12/26/19 2015   12/21/2019 2000  azithromycin (ZITHROMAX) tablet 500 mg     500 mg Oral Daily 12/15/2019 1853 12/26/19 0914   12/13/2019 1400  remdesivir 200 mg in sodium chloride 0.9% 250 mL IVPB     200 mg 580 mL/hr over 30 Minutes Intravenous Once 01/05/2020 1313 12/29/2019 1616        Objective:   Vitals:   12/29/19 2339 12/30/19 0811 12/30/19 1101 12/30/19 1200  BP: (!) 146/64 (!) 153/90    Pulse: (!) 102     Resp: 18 16  18   Temp: 98.2 F (36.8 C) 98.3 F (36.8 C)  98.2 F (36.8 C)  TempSrc: Oral Oral  Axillary  SpO2: 92%  92%   Weight:      Height:        Wt Readings from Last 3 Encounters:  01/04/2020 72.2 kg  06/16/19 76.2 kg  06/11/19 76.2 kg     Intake/Output Summary (Last 24 hours) at 12/30/2019 1556 Last data filed at 12/30/2019 0929 Gross per 24 hour  Intake --  Output 802 ml  Net -802 ml     Physical Exam  Awake Alert, Oriented X 3, No new F.N deficits, Normal affect Symmetrical Chest wall movement, Good air movement bilaterally, scattered rales RRR,No Gallops,Rubs or new Murmurs, No Parasternal Heave +ve B.Sounds, Abd Soft, No tenderness, No rebound - guarding or rigidity. No Cyanosis, Clubbing or edema, No new Rash or bruise       Data Review:    CBC Recent Labs  Lab 12/25/19 0202 12/25/19 0202 12/26/19 0148 12/27/19 0436 12/28/19 0326 12/29/19 0650 12/30/19 0215  WBC 17.5*   < >  21.3* 26.7* 20.5* 27.1* 22.8*  HGB 12.6*   < > 11.7* 13.1 11.8* 13.0 12.0*  HCT 37.5*   < > 35.1* 39.3 34.9* 38.7* 35.7*  PLT 368   < > 374 499* 412* 525* 404*  MCV 87.4   < > 87.8 87.3 86.4 88.2 86.9  MCH 29.4   < > 29.3 29.1 29.2  29.6 29.2  MCHC 33.6   < > 33.3 33.3 33.8 33.6 33.6  RDW 14.5   < > 14.6 14.8 14.6 15.1 14.8  LYMPHSABS 0.7  --  0.7 0.9 0.5* 1.0  --   MONOABS 0.7  --  1.1* 1.1* 0.6 0.4  --   EOSABS 0.0  --  0.0 0.0 0.0 0.0  --   BASOSABS 0.1  --  0.1 0.2* 0.1 0.2*  --    < > = values in this interval not displayed.    Chemistries  Recent Labs  Lab 12/25/19 0202 12/25/19 0202 12/26/19 0148 12/27/19 0436 12/28/19 0326 12/29/19 0650 12/30/19 0215  NA 136   < > 140 141 140 138 136  K 3.6   < > 4.2 4.0 4.0 4.3 4.7  CL 103   < > 107 104 106 102 104  CO2 21*   < > 23 22 22 24  21*  GLUCOSE 276*   < > 168* 152* 189* 63* 61*  BUN 47*   < > 42* 45* 44* 41* 44*  CREATININE 1.55*   < > 1.38* 1.30* 1.29* 1.21 1.26*  CALCIUM 8.4*   < > 8.9 8.7* 8.4* 8.7* 8.7*  MG 2.3  --  2.2 2.2 2.0 1.9  --   AST 37   < > 42* 78* 63* 58* 53*  ALT 41   < > 46* 105* 96* 94* 79*  ALKPHOS 66   < > 71 118 130* 179* 192*  BILITOT 0.1*   < > 0.2* 0.5 1.0 0.7 0.9   < > = values in this interval not displayed.   ------------------------------------------------------------------------------------------------------------------ No results for input(s): CHOL, HDL, LDLCALC, TRIG, CHOLHDL, LDLDIRECT in the last 72 hours.  Lab Results  Component Value Date   HGBA1C 7.4 (H) 12/23/2019   ------------------------------------------------------------------------------------------------------------------ No results for input(s): TSH, T4TOTAL, T3FREE, THYROIDAB in the last 72 hours.  Invalid input(s): FREET3 ------------------------------------------------------------------------------------------------------------------ No results for input(s): VITAMINB12, FOLATE, FERRITIN, TIBC, IRON, RETICCTPCT in the last 72 hours.  Coagulation profile No results for input(s): INR, PROTIME in the last 168 hours.  Recent Labs    12/28/19 0326 12/30/19 0215  DDIMER 2.68* 6.68*    Cardiac Enzymes No results for input(s): CKMB, TROPONINI,  MYOGLOBIN in the last 168 hours.  Invalid input(s): CK ------------------------------------------------------------------------------------------------------------------    Component Value Date/Time   BNP 148.6 (H) 12/25/2019 0202    Inpatient Medications  Scheduled Meds: . apixaban  5 mg Oral BID  . vitamin C  500 mg Oral Daily  . aspirin EC  81 mg Oral QODAY  . dexamethasone (DECADRON) injection  10 mg Intravenous Daily  . famotidine  20 mg Oral BID  . insulin aspart  0-20 Units Subcutaneous TID WC  . insulin aspart  0-5 Units Subcutaneous QHS  . insulin detemir  10 Units Subcutaneous Daily  . Ipratropium-Albuterol  2 puff Inhalation Q6H  . linagliptin  5 mg Oral Daily  . metoprolol tartrate  37.5 mg Oral BID  . [START ON 12/31/2019] pneumococcal 23 valent vaccine  0.5 mL Intramuscular Tomorrow-1000  . zinc sulfate  220 mg  Oral Daily   Continuous Infusions:  PRN Meds:.guaiFENesin-dextromethorphan, Melatonin, traZODone  Micro Results No results found for this or any previous visit (from the past 240 hour(s)).  Radiology Reports CT ANGIO CHEST PE W OR WO CONTRAST  Result Date: 12/23/2019 CLINICAL DATA:  Shortness of breath. Coronavirus exposure in late December. Fever and shortness of breath. EXAM: CT ANGIOGRAPHY CHEST WITH CONTRAST TECHNIQUE: Multidetector CT imaging of the chest was performed using the standard protocol during bolus administration of intravenous contrast. Multiplanar CT image reconstructions and MIPs were obtained to evaluate the vascular anatomy. CONTRAST:  57mL OMNIPAQUE IOHEXOL 350 MG/ML SOLN COMPARISON:  Chest radiography yesterday. FINDINGS: Cardiovascular: Pulmonary arterial opacification is good. No pulmonary emboli. The aorta shows advanced atherosclerotic disease with irregular plaque. No aneurysm or dissection. Heart size is normal. There is coronary artery calcification. Mediastinum/Nodes: No mediastinal mass or adenopathy. Small mediastinal lymph  nodes are present. Single exception to this is a right paratracheal node in the upper chest measuring 1.4 cm in diameter. Lungs/Pleura: Widespread bilateral hazy mid and lower lung pulmonary infiltrates typical of coronavirus infection. No dense consolidation, collapse or effusion. No sign of underlying lung lesion. Upper Abdomen: Negative Musculoskeletal: Ordinary degenerative changes affect the spine. Review of the MIP images confirms the above findings. IMPRESSION: Widespread bilateral pulmonary infiltrates which are hazy and lower chest predominant. Findings typical of coronavirus pneumonia. No dense consolidation, lobar collapse or pleural effusion. No pulmonary emboli. Considerable aortic atherosclerosis.  Coronary artery calcification. Electronically Signed   By: Nelson Chimes M.D.   On: 12/23/2019 15:46   DG Chest Port 1 View  Result Date: 12/27/2019 CLINICAL DATA:  Hypoxia. EXAM: PORTABLE CHEST 1 VIEW COMPARISON:  December 22, 2019. FINDINGS: The heart size and mediastinal contours are within normal limits. No pneumothorax or pleural effusion is noted. Increased bilateral basilar opacities are noted concerning for worsening infiltrates or atelectasis. The visualized skeletal structures are unremarkable. IMPRESSION: Increased bilateral basilar opacities are noted concerning for worsening infiltrates or atelectasis. Electronically Signed   By: Marijo Conception M.D.   On: 12/27/2019 11:54   DG Chest Portable 1 View  Result Date: 01/04/2020 CLINICAL DATA:  Cough and shortness of breath. EXAM: PORTABLE CHEST 1 VIEW COMPARISON:  Chest x-ray dated January 08, 2019. FINDINGS: The heart size and mediastinal contours are within normal limits. Normal pulmonary vascularity. Patchy opacities at the left lung base. No pleural effusion or pneumothorax. No acute osseous abnormality. IMPRESSION: 1. Left lower lobe pneumonia. Electronically Signed   By: Titus Dubin M.D.   On: 12/18/2019 10:41      Phillips Climes M.D on 12/30/2019 at 3:56 PM  Between 7am to 7pm - Pager - [phone]  After 7pm go to [email or web address] - password [facility]  [facility] -  Office  [phone]

## 2019-12-30 NOTE — Plan of Care (Signed)
Plan of Care reviewed. 

## 2019-12-30 NOTE — Progress Notes (Signed)
Patient states that "I just physically can't do it anymore."  Staes he will do a lot better at home and that his son will be here in the A.M. to pick him up.  Talked with his wife Bethena Roys on the phone, says that yes he wants to come home.  Educated both [place] and patient that still requiring 10 L HFNC, which is a lot of oxygen, more than patient would be able to be on a home oxygen source.  Asked patient if he wanted a bed bath tonight, states that he did one today and changed his clothes on his own.

## 2019-12-30 NOTE — Progress Notes (Signed)
[place] for Lovenox>apixaban Indication: atrial fibrillation  Allergies  Allergen Reactions  . Lisinopril Other (See Comments)    BP dropped very low    Patient Measurements: Height: 5\' 8"  (172.7 cm) Weight: 159 lb 3.2 oz (72.2 kg) IBW/kg (Calculated) : 68.4  Vital Signs: Temp: 98.2 F (36.8 C) (01/19 1200) Temp Source: Axillary (01/19 1200) BP: 153/90 (01/19 0811)  Labs: Recent Labs    12/28/19 0326 12/28/19 0326 12/29/19 0650 12/30/19 0215  HGB 11.8*   < > 13.0 12.0*  HCT 34.9*  --  38.7* 35.7*  PLT 412*  --  525* 404*  CREATININE 1.29*  --  1.21 1.26*   < > = values in this interval not displayed.    Estimated Creatinine Clearance: 46.7 mL/min (A) (by C-G formula based on SCr of 1.26 mg/dL (H)).   Medical History: Past Medical History:  Diagnosis Date  . Acquired hypothyroidism   . Arthritis   . Cervical radiculopathy    "fingers get cold and my neck pops when i turn it dide to side, theyre going to send me for a nerve induction next month"  . Chronic kidney disease    "they told me a couple years ago  to avoid taking ibuprofen because my kidneys werent 100%"  . Environmental allergies   . Hypertension   . Neuropathy    "ive got numbness in my left foot "   . Type 2 diabetes mellitus (HCC)     Medications: See med list  Assessment: 79 y/o M admitted with COVID-19 PNA. Patient has developed afib w/ RVR. Pharmacy is consulted for Lovenox. Patient has a h/o CKD with SCr near baseline yesterday, improved from admission. Awaiting labs today.   CHA2DS2-VASc Score =  4  Pt has been on Lovenox for his anticoagulation. Ok to change to apixaban today per Dr. Waldron Labs today.   Scr 1.26, CBC stable, age<80, wt>60kg  Goal of Therapy:  Monitor platelets by anticoagulation protocol: Yes   Plan:  Dc lovenox  Apixaban 5mg  PO BID  Onnie Boer, PharmD, BCIDP, AAHIVP, CPP Infectious Disease Pharmacist 12/30/2019 1:57  PM

## 2019-12-31 LAB — CBC
HCT: 35.1 % — ABNORMAL LOW (ref 39.0–52.0)
Hemoglobin: 12 g/dL — ABNORMAL LOW (ref 13.0–17.0)
MCH: 29.9 pg (ref 26.0–34.0)
MCHC: 34.2 g/dL (ref 30.0–36.0)
MCV: 87.5 fL (ref 80.0–100.0)
Platelets: 335 10*3/uL (ref 150–400)
RBC: 4.01 MIL/uL — ABNORMAL LOW (ref 4.22–5.81)
RDW: 14.6 % (ref 11.5–15.5)
WBC: 21.1 10*3/uL — ABNORMAL HIGH (ref 4.0–10.5)
nRBC: 0 % (ref 0.0–0.2)

## 2019-12-31 LAB — GLUCOSE, CAPILLARY
Glucose-Capillary: 75 mg/dL (ref 70–99)
Glucose-Capillary: 179 mg/dL — ABNORMAL HIGH (ref 70–99)
Glucose-Capillary: 262 mg/dL — ABNORMAL HIGH (ref 70–99)
Glucose-Capillary: 251 mg/dL — ABNORMAL HIGH (ref 70–99)

## 2019-12-31 LAB — COMPREHENSIVE METABOLIC PANEL
ALT: 62 U/L — ABNORMAL HIGH (ref 0–44)
AST: 36 U/L (ref 15–41)
Albumin: 2.8 g/dL — ABNORMAL LOW (ref 3.5–5.0)
Alkaline Phosphatase: 168 U/L — ABNORMAL HIGH (ref 38–126)
Anion gap: 11 (ref 5–15)
BUN: 44 mg/dL — ABNORMAL HIGH (ref 8–23)
CO2: 21 mmol/L — ABNORMAL LOW (ref 22–32)
Calcium: 8.5 mg/dL — ABNORMAL LOW (ref 8.9–10.3)
Chloride: 103 mmol/L (ref 98–111)
Creatinine, Ser: 1.27 mg/dL — ABNORMAL HIGH (ref 0.61–1.24)
GFR calc Af Amer: >60 mL/min (ref >60)
GFR calc non Af Amer: 54 mL/min — ABNORMAL LOW (ref >60)
Glucose, Bld: 63 mg/dL — ABNORMAL LOW (ref 70–99)
Potassium: 4.4 mmol/L (ref 3.5–5.1)
Sodium: 135 mmol/L (ref 135–145)
Total Bilirubin: 1.1 mg/dL (ref 0.3–1.2)
Total Protein: 6.3 g/dL — ABNORMAL LOW (ref 6.5–8.1)

## 2019-12-31 LAB — C-REACTIVE PROTEIN: CRP: 12 mg/dL — ABNORMAL HIGH (ref <1.0)

## 2019-12-31 LAB — D-DIMER, QUANTITATIVE: D-Dimer, Quant: 3.7 ug/mL-FEU — ABNORMAL HIGH (ref 0.00–0.50)

## 2019-12-31 MED ORDER — SALINE SPRAY 0.65 % NA SOLN
1.0000 | NASAL | Status: DC | PRN
  Administered 2019-12-31: 1 via NASAL
  Filled 2019-12-31: qty 44

## 2019-12-31 NOTE — Plan of Care (Signed)
Plan of Care reviewed. 

## 2019-12-31 NOTE — Progress Notes (Signed)
Pt asked earlier if he felt an anti-anxiety medication might help but patient refused.  Pt given Trazodone for sleep earlier since Melatonin did not seem to help much the night before.  Pt is very restless in bed, wires keep getting tangled, pt does not seem to be sleeping well again tonight.

## 2019-12-31 NOTE — Progress Notes (Signed)
PROGRESS NOTE  Terrence Cisneros [id number] DOB: July 25, 1941 DOA: 12/15/2019 PCP: [name], MD   LOS: 9 days   Brief Narrative / Interim history: 79 year old male with hypothyroidism, OA, CKD 3A with baseline creatinine 1.4-1.8, HTN, DM2 who was admitted to the hospital on 1/11 due to worsening dyspnea and he was diagnosed with COVID-19.  He was exposed around Christmas time.  Due to hypoxia he was admitted to [facility] for further management  Subjective / 24h Interval events: Quite upset this morning and repetitive regarding his wishes to go home as he feels like his environment would be a lot Calmer his own environment.  Has little to no understanding the fact that he is still requiring 12 L nasal cannula and we cannot do that at home  Assessment & Plan:  Principal Problem Acute Hypoxic Respiratory Failure due to Covid-19 Viral Illness -Patient with significant oxygen requirements currently on 12 L remaining quite hypoxic and satting in the mid 80s while talking -He is very short of breath with minimal exertion -His CRP remains high, he has finished a course of remdesivir and currently on IV steroids -He received convalescent plasma on 1/14, was not a candidate for Actemra given elevated procalcitonin at 37 on admission -He also completed 5-day course of ceftriaxone and azithromycin -Continue to wean off as tolerated  COVID-19 Labs  Recent Labs    12/30/19 0215 12/31/19 0425  DDIMER 6.68* 3.70*  CRP 15.1* 12.0*    Lab Results  Component Value Date   SARSCOV2NAA NEGATIVE 06/12/2019    Active Problems A. 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 apixaban  5 mg Oral BID  . vitamin C  500 mg Oral Daily  . aspirin EC  81 mg Oral QODAY  . dexamethasone (DECADRON) injection  10 mg Intravenous Daily  . famotidine  20 mg Oral BID  . insulin aspart  0-20 Units Subcutaneous TID WC  . insulin aspart  0-5 Units Subcutaneous QHS  . insulin detemir  7 Units Subcutaneous Daily  . Ipratropium-Albuterol  2 puff Inhalation Q6H  . linagliptin  5 mg Oral Daily  . metoprolol tartrate  50 mg Oral BID  . pneumococcal 23 valent vaccine  0.5 mL Intramuscular Tomorrow-1000  . zinc sulfate  220 mg Oral Daily   Continuous Infusions: PRN Meds:.guaiFENesin-dextromethorphan, Melatonin, sodium chloride, traZODone  DVT prophylaxis: On Eliquis Code Status: Full code Family Communication: Discussed with patient Patient admitted from: Home Anticipated d/c place: Home Barriers to d/c: Persistent hypoxia, will be able to be discharged home once oxygen levels improve hopefully on room air may be discharged on couple liters  Consultants:  None  Procedures:  None  Microbiology: None  Antimicrobials: None  Objective: Vitals:   12/30/19 2114 12/31/19 0400 12/31/19 0803 12/31/19 1200  BP: (!) 114/56 125/72 Marland Kitchen)  145/90 115/65  Pulse:  71 90 89  Resp:  (!) 22 19 17   Temp:  98.9 F (37.2 C) (!) 97.3 F (36.3 C)   TempSrc:  Axillary Oral   SpO2:  91% 90% 90%  Weight:      Height:        Intake/Output Summary (Last 24 hours) at 12/31/2019 1505 Last data filed at 12/31/2019 0600 Gross per 24 hour  Intake --  Output 275 ml  Net -275 ml   Filed Weights   12/20/2019 0956 12/21/2019 2100   Weight: 77.1 kg 72.2 kg    Examination:  Constitutional: NAD Eyes: no scleral icterus ENMT: Mucous membranes are moist.  Neck: normal, supple Respiratory: Diffuse bilateral rhonchi, no wheezing, no crackles, tachypneic Cardiovascular: Regular rate and rhythm, no murmurs / rubs / gallops.  Abdomen: non distended, no tenderness. Bowel sounds positive.  Musculoskeletal: no clubbing / cyanosis.  Skin: no rashes Neurologic: CN 2-12 grossly intact. Strength 5/5 in all 4.   Data Reviewed: I have independently reviewed following labs and imaging studies   CBC: Recent Labs  Lab 12/25/19 0202 12/25/19 0202 12/26/19 0148 12/26/19 0148 12/27/19 0436 12/28/19 0326 12/29/19 0650 12/30/19 0215 12/31/19 0425  WBC 17.5*   < > 21.3*   < > 26.7* 20.5* 27.1* 22.8* 21.1*  NEUTROABS 15.6*  --  18.8*  --  23.4* 18.4* 23.9*  --   --   HGB 12.6*   < > 11.7*   < > 13.1 11.8* 13.0 12.0* 12.0*  HCT 37.5*   < > 35.1*   < > 39.3 34.9* 38.7* 35.7* 35.1*  MCV 87.4   < > 87.8   < > 87.3 86.4 88.2 86.9 87.5  PLT 368   < > 374   < > 499* 412* 525* 404* 335   < > = values in this interval not displayed.   Basic Metabolic Panel: Recent Labs  Lab 12/25/19 0202 12/25/19 0202 12/26/19 0148 12/26/19 0148 12/27/19 [id number] 12/28/19 0326 12/29/19 0650 12/30/19 0215 12/31/19 0425  NA 136   < > 140   < > 141 140 138 136 135  K 3.6   < > 4.2   < > 4.0 4.0 4.3 4.7 4.4  CL 103   < > 107   < > 104 106 102 104 103  CO2 21*   < > 23   < > 22 22 24  21* 21*  GLUCOSE 276*   < > 168*   < > 152* 189* 63* 61* 63*  BUN 47*   < > 42*   < > 45* 44* 41* 44* 44*  CREATININE 1.55*   < > 1.38*   < > 1.30* 1.29* 1.21 1.26* 1.27*  CALCIUM 8.4*   < > 8.9   < > 8.7* 8.4* 8.7* 8.7* 8.5*  MG 2.3  --  2.2  --  2.2 2.0 1.9  --   --   PHOS 2.6   < > 2.2*  --  3.5 3.9 2.9 4.1  --    < > = values in this interval not displayed.   GFR: Estimated Creatinine Clearance: 46.4 mL/min (A) (by C-G formula based on SCr of 1.27 mg/dL  (H)). Liver Function Tests: Recent Labs  Lab 12/27/19 0436 12/28/19 0326 12/29/19 0650 12/30/19 0215 12/31/19 0425  AST 78* 63* 58* 53* 36  ALT 105* 96* 94* 79* 62*  ALKPHOS 118 130* 179* 192* 168*  BILITOT 0.5 1.0 0.7 0.9 1.1  PROT 7.0 6.3* 6.9 6.3* 6.3*  ALBUMIN 3.1* 2.9* 3.2* 2.9* 2.8*   No results for input(s): LIPASE, AMYLASE in the last 168 hours. No results for input(s): AMMONIA in the last 168 hours. Coagulation Profile: No results for input(s): INR, PROTIME in the last 168 hours. Cardiac Enzymes: No results for input(s): CKTOTAL, CKMB, CKMBINDEX, TROPONINI in the last 168 hours. BNP (last 3 results) No results for input(s): PROBNP in the last 8760 hours. HbA1C: No results for input(s): HGBA1C in the last 72 hours. CBG: Recent Labs  Lab 12/30/19 0811 12/30/19 1159 12/30/19 1644 12/30/19 2002 12/31/19 1241  GLUCAP 67* 168* 212* 205* 179*   Lipid Profile: No results for input(s): CHOL, HDL, LDLCALC, TRIG, CHOLHDL, LDLDIRECT in the last 72 hours. Thyroid Function Tests: No results for input(s): TSH, T4TOTAL, FREET4, T3FREE, THYROIDAB in the last 72 hours. Anemia Panel: No results for input(s): VITAMINB12, FOLATE, FERRITIN, TIBC, IRON, RETICCTPCT in the last 72 hours. Urine analysis:    Component Value Date/Time   COLORURINE YELLOW 12/16/2019 1006   APPEARANCEUR CLEAR 01/04/2020 1006   LABSPEC 1.023 01/10/2020 1006   PHURINE 6.0 12/14/2019 1006   GLUCOSEU 150 (A) 12/30/2019 1006   HGBUR MODERATE (A) 12/16/2019 1006   BILIRUBINUR NEGATIVE 12/18/2019 1006   KETONESUR 5 (A) 12/21/2019 1006   PROTEINUR 100 (A) 01/11/2020 1006   UROBILINOGEN 0.2 02/02/2012 2341   NITRITE NEGATIVE 12/26/2019 1006   LEUKOCYTESUR NEGATIVE 12/19/2019 1006   Sepsis Labs: Invalid input(s): PROCALCITONIN, LACTICIDVEN  No results found for this or any previous visit (from the past 240 hour(s)).    Radiology Studies: No results found.   Marzetta Board, MD, PhD [facility]  Contact via  [email or web address]  [place] P: [phone] F: [phone]

## 2019-12-31 NOTE — Discharge Instructions (Signed)
Information on my medicine - ELIQUIS (apixaban)  This medication education was reviewed with me or my healthcare representative as part of my discharge preparation.  The pharmacist that spoke with me during my hospital stay was:  Onnie Boer, RPH-CPP  Why was Eliquis prescribed for you? Eliquis was prescribed for you to reduce the risk of a blood clot forming that can cause a stroke if you have a medical condition called atrial fibrillation (a type of irregular heartbeat).  What do You need to know about Eliquis ? Take your Eliquis TWICE DAILY - one tablet in the morning and one tablet in the evening with or without food. If you have difficulty swallowing the tablet whole please discuss with your pharmacist how to take the medication safely.  Take Eliquis exactly as prescribed by your doctor and DO NOT stop taking Eliquis without talking to the doctor who prescribed the medication.  Stopping may increase your risk of developing a stroke.  Refill your prescription before you run out.  After discharge, you should have regular check-up appointments with your healthcare provider that is prescribing your Eliquis.  In the future your dose may need to be changed if your kidney function or weight changes by a significant amount or as you get older.  What do you do if you miss a dose? If you miss a dose, take it as soon as you remember on the same day and resume taking twice daily.  Do not take more than one dose of ELIQUIS at the same time to make up a missed dose.  Important Safety Information A possible side effect of Eliquis is bleeding. You should call your healthcare provider right away if you experience any of the following: ? Bleeding from an injury or your nose that does not stop. ? Unusual colored urine (red or dark brown) or unusual colored stools (red or black). ? Unusual bruising for unknown reasons. ? A serious fall or if you hit your head (even if there is no bleeding).  Some  medicines may interact with Eliquis and might increase your risk of bleeding or clotting while on Eliquis. To help avoid this, consult your healthcare provider or pharmacist prior to using any new prescription or non-prescription medications, including herbals, vitamins, non-steroidal anti-inflammatory drugs (NSAIDs) and supplements.  This website has more information on Eliquis (apixaban): http://www.eliquis.com/eliquis/home   ------------------------------------------ Atrial Fibrillation   Atrial fibrillation is a type of heartbeat that is irregular or fast. If you have this condition, your heart beats without any order. This makes it hard for your heart to pump blood in a normal way. Atrial fibrillation may come and go, or it may become a long-lasting problem. If this condition is not treated, it can put you at higher risk for stroke, heart failure, and other heart problems. What are the causes? This condition may be caused by diseases that damage the heart. They include:  High blood pressure.  Heart failure.  Heart valve disease.  Heart surgery. Other causes include:  Diabetes.  Thyroid disease.  Being overweight.  Kidney disease. Sometimes the cause is not known. What increases the risk? You are more likely to develop this condition if:  You are older.  You smoke.  You exercise often and very hard.  You have a family history of this condition.  You are a man.  You use drugs.  You drink a lot of alcohol.  You have lung conditions, such as emphysema, pneumonia, or COPD.  You have sleep apnea.  What are the signs or symptoms? Common symptoms of this condition include:  A feeling that your heart is beating very fast.  Chest pain or discomfort.  Feeling short of breath.  Suddenly feeling light-headed or weak.  Getting tired easily during activity.  Fainting.  Sweating. In some cases, there are no symptoms. How is this treated? Treatment for this  condition depends on underlying conditions and how you feel when you have atrial fibrillation. They include:  Medicines to: ? Prevent blood clots. ? Treat heart rate or heart rhythm problems.  Using devices, such as a pacemaker, to correct heart rhythm problems.  Doing surgery to remove the part of the heart that sends bad signals.  Closing an area where clots can form in the heart (left atrial appendage). In some cases, your doctor will treat other underlying conditions. Follow these instructions at home: Medicines  Take over-the-counter and prescription medicines only as told by your doctor.  Do not take any new medicines without first talking to your doctor.  If you are taking blood thinners: ? Talk with your doctor before you take any medicines that have aspirin or NSAIDs, such as ibuprofen, in them. ? Take your medicine exactly as told by your doctor. Take it at the same time each day. ? Avoid activities that could hurt or bruise you. Follow instructions about how to prevent falls. ? Wear a bracelet that says you are taking blood thinners. Or, carry a card that lists what medicines you take. Lifestyle       Do not use any products that have nicotine or tobacco in them. These include cigarettes, e-cigarettes, and chewing tobacco. If you need help quitting, ask your doctor.  Eat heart-healthy foods. Talk with your doctor about the right eating plan for you.  Exercise regularly as told by your doctor.  Do not drink alcohol.  Lose weight if you are overweight.  Do not use drugs, including cannabis. General instructions  If you have a condition that causes breathing to stop for a short period of time (apnea), treat it as told by your doctor.  Keep a healthy weight. Do not use diet pills unless your doctor says they are safe for you. Diet pills may make heart problems worse.  Keep all follow-up visits as told by your doctor. This is important. Contact a doctor  if:  You notice a change in the speed, rhythm, or strength of your heartbeat.  You are taking a blood-thinning medicine and you get more bruising.  You get tired more easily when you move or exercise.  You have a sudden change in weight. Get help right away if:    You have pain in your chest or your belly (abdomen).  You have trouble breathing.  You have side effects of blood thinners, such as blood in your vomit, poop (stool), or pee (urine), or bleeding that cannot stop.  You have any signs of a stroke. "BE FAST" is an easy way to remember the main warning signs: ? B - Balance. Signs are dizziness, sudden trouble walking, or loss of balance. ? E - Eyes. Signs are trouble seeing or a change in how you see. ? F - Face. Signs are sudden weakness or loss of feeling in the face, or the face or eyelid drooping on one side. ? A - Arms. Signs are weakness or loss of feeling in an arm. This happens suddenly and usually on one side of the body. ? S - Speech. Signs are  sudden trouble speaking, slurred speech, or trouble understanding what people say. ? T - Time. Time to call emergency services. Write down what time symptoms started.  You have other signs of a stroke, such as: ? A sudden, very bad headache with no known cause. ? Feeling like you may vomit (nausea). ? Vomiting. ? A seizure. These symptoms may be an emergency. Do not wait to see if the symptoms will go away. Get medical help right away. Call your local emergency services (911 in the [place] Do not drive yourself to the hospital. Summary  Atrial fibrillation is a type of heartbeat that is irregular or fast.  You are at higher risk of this condition if you smoke, are older, have diabetes, or are overweight.  Follow your doctor's instructions about medicines, diet, exercise, and follow-up visits.  Get help right away if you have signs or symptoms of a stroke.  Get help right away if you cannot catch your breath, or you  have chest pain or discomfort. This information is not intended to replace advice given to you by your health care provider. Make sure you discuss any questions you have with your health care provider. Document Revised: 05/21/2019 Document Reviewed: 05/21/2019 Elsevier Patient Education  [place].

## 2020-01-01 ENCOUNTER — Inpatient Hospital Stay (HOSPITAL_COMMUNITY): Payer: PPO

## 2020-01-01 DIAGNOSIS — N179 Acute kidney failure, unspecified: Secondary | ICD-10-CM

## 2020-01-01 DIAGNOSIS — J1282 Pneumonia due to coronavirus disease 2019: Secondary | ICD-10-CM

## 2020-01-01 DIAGNOSIS — U071 COVID-19: Principal | ICD-10-CM

## 2020-01-01 LAB — CBC
HCT: 35.9 % — ABNORMAL LOW (ref 39.0–52.0)
Hemoglobin: 12 g/dL — ABNORMAL LOW (ref 13.0–17.0)
MCH: 29.6 pg (ref 26.0–34.0)
MCHC: 33.4 g/dL (ref 30.0–36.0)
MCV: 88.4 fL (ref 80.0–100.0)
Platelets: 275 10*3/uL (ref 150–400)
RBC: 4.06 MIL/uL — ABNORMAL LOW (ref 4.22–5.81)
RDW: 14.7 % (ref 11.5–15.5)
WBC: 26.1 10*3/uL — ABNORMAL HIGH (ref 4.0–10.5)
nRBC: 0 % (ref 0.0–0.2)

## 2020-01-01 LAB — POCT I-STAT 7, (LYTES, BLD GAS, ICA,H+H)
Acid-base deficit: 4 mmol/L — ABNORMAL HIGH (ref 0.0–2.0)
Bicarbonate: 21.2 mmol/L (ref 20.0–28.0)
Calcium, Ion: 1.17 mmol/L (ref 1.15–1.40)
HCT: 31 % — ABNORMAL LOW (ref 39.0–52.0)
Hemoglobin: 10.5 g/dL — ABNORMAL LOW (ref 13.0–17.0)
O2 Saturation: 99 %
Potassium: 4.3 mmol/L (ref 3.5–5.1)
Sodium: 135 mmol/L (ref 135–145)
TCO2: 22 mmol/L (ref 22–32)
pCO2 arterial: 37.3 mmHg (ref 32.0–48.0)
pH, Arterial: 7.363 (ref 7.350–7.450)
pO2, Arterial: 170 mmHg — ABNORMAL HIGH (ref 83.0–108.0)

## 2020-01-01 LAB — COMPREHENSIVE METABOLIC PANEL
ALT: 49 U/L — ABNORMAL HIGH (ref 0–44)
AST: 29 U/L (ref 15–41)
Albumin: 2.6 g/dL — ABNORMAL LOW (ref 3.5–5.0)
Alkaline Phosphatase: 139 U/L — ABNORMAL HIGH (ref 38–126)
Anion gap: 13 (ref 5–15)
BUN: 48 mg/dL — ABNORMAL HIGH (ref 8–23)
CO2: 22 mmol/L (ref 22–32)
Calcium: 8.4 mg/dL — ABNORMAL LOW (ref 8.9–10.3)
Chloride: 99 mmol/L (ref 98–111)
Creatinine, Ser: 1.21 mg/dL (ref 0.61–1.24)
GFR calc Af Amer: >60 mL/min (ref >60)
GFR calc non Af Amer: 57 mL/min — ABNORMAL LOW (ref >60)
Glucose, Bld: 110 mg/dL — ABNORMAL HIGH (ref 70–99)
Potassium: 4.8 mmol/L (ref 3.5–5.1)
Sodium: 134 mmol/L — ABNORMAL LOW (ref 135–145)
Total Bilirubin: 0.9 mg/dL (ref 0.3–1.2)
Total Protein: 6 g/dL — ABNORMAL LOW (ref 6.5–8.1)

## 2020-01-01 LAB — C-REACTIVE PROTEIN: CRP: 5.6 mg/dL — ABNORMAL HIGH (ref <1.0)

## 2020-01-01 LAB — D-DIMER, QUANTITATIVE: D-Dimer, Quant: 8.77 ug/mL-FEU — ABNORMAL HIGH (ref 0.00–0.50)

## 2020-01-01 LAB — GLUCOSE, CAPILLARY
Glucose-Capillary: 131 mg/dL — ABNORMAL HIGH (ref 70–99)
Glucose-Capillary: 145 mg/dL — ABNORMAL HIGH (ref 70–99)
Glucose-Capillary: 133 mg/dL — ABNORMAL HIGH (ref 70–99)
Glucose-Capillary: 140 mg/dL — ABNORMAL HIGH (ref 70–99)
Glucose-Capillary: 207 mg/dL — ABNORMAL HIGH (ref 70–99)

## 2020-01-01 LAB — PHOSPHORUS
Phosphorus: 4.1 mg/dL (ref 2.5–4.6)
Phosphorus: 4.9 mg/dL — ABNORMAL HIGH (ref 2.5–4.6)

## 2020-01-01 LAB — MAGNESIUM
Magnesium: 2.2 mg/dL (ref 1.7–2.4)
Magnesium: 2.1 mg/dL (ref 1.7–2.4)

## 2020-01-01 MED ORDER — CHLORHEXIDINE GLUCONATE CLOTH 2 % EX PADS
6.0000 | MEDICATED_PAD | Freq: Every day | CUTANEOUS | Status: DC
  Administered 2020-01-01 – 2020-01-07 (×7): 6 via TOPICAL

## 2020-01-01 MED ORDER — ZINC SULFATE 220 (50 ZN) MG PO CAPS
220.0000 mg | ORAL_CAPSULE | Freq: Every day | ORAL | Status: DC
  Administered 2020-01-01 – 2020-01-07 (×7): 220 mg
  Filled 2020-01-01 (×7): qty 1

## 2020-01-01 MED ORDER — DEXMEDETOMIDINE HCL IN NACL 400 MCG/100ML IV SOLN
0.0000 ug/kg/h | INTRAVENOUS | Status: DC
  Administered 2020-01-01: 0.4 ug/kg/h via INTRAVENOUS
  Filled 2020-01-01: qty 100

## 2020-01-01 MED ORDER — LACTATED RINGERS IV SOLN: INTRAVENOUS | Status: DC

## 2020-01-01 MED ORDER — DOCUSATE SODIUM 50 MG/5ML PO LIQD: 100.0000 mg | Freq: Two times a day (BID) | ORAL | Status: DC | PRN

## 2020-01-01 MED ORDER — FENTANYL CITRATE (PF) 100 MCG/2ML IJ SOLN
INTRAMUSCULAR | Status: AC
  Administered 2020-01-01: 25 ug via INTRAVENOUS
  Filled 2020-01-01: qty 2

## 2020-01-01 MED ORDER — MIDAZOLAM HCL 2 MG/2ML IJ SOLN
1.0000 mg | INTRAMUSCULAR | Status: DC | PRN
  Administered 2020-01-05 – 2020-01-07 (×3): 1 mg via INTRAVENOUS
  Filled 2020-01-01 (×6): qty 2

## 2020-01-01 MED ORDER — ROCURONIUM BROMIDE 10 MG/ML (PF) SYRINGE: 70.0000 mg | PREFILLED_SYRINGE | Freq: Once | INTRAVENOUS | Status: AC

## 2020-01-01 MED ORDER — PROPOFOL 1000 MG/100ML IV EMUL
0.0000 ug/kg/min | INTRAVENOUS | Status: DC
  Administered 2020-01-01: 20 ug/kg/min via INTRAVENOUS
  Administered 2020-01-01: 5 ug/kg/min via INTRAVENOUS
  Filled 2020-01-01 (×3): qty 100

## 2020-01-01 MED ORDER — VITAL HIGH PROTEIN PO LIQD
1000.0000 mL | ORAL | Status: DC
  Administered 2020-01-01: 1000 mL

## 2020-01-01 MED ORDER — FENTANYL CITRATE (PF) 100 MCG/2ML IJ SOLN
25.0000 ug | INTRAMUSCULAR | Status: DC | PRN
  Administered 2020-01-04: 25 ug via INTRAVENOUS
  Administered 2020-01-04: 50 ug via INTRAVENOUS
  Administered 2020-01-04 – 2020-01-05 (×4): 25 ug via INTRAVENOUS
  Administered 2020-01-05: 50 ug via INTRAVENOUS
  Administered 2020-01-06 – 2020-01-07 (×9): 100 ug via INTRAVENOUS
  Filled 2020-01-01 (×13): qty 2

## 2020-01-01 MED ORDER — MIDAZOLAM HCL 2 MG/2ML IJ SOLN: 1.0000 mg | Freq: Once | INTRAMUSCULAR | Status: AC

## 2020-01-01 MED ORDER — INSULIN ASPART 100 UNIT/ML ~~LOC~~ SOLN
0.0000 [IU] | SUBCUTANEOUS | Status: DC
  Administered 2020-01-01: 5 [IU] via SUBCUTANEOUS
  Administered 2020-01-01 (×3): 2 [IU] via SUBCUTANEOUS
  Administered 2020-01-02 (×2): 5 [IU] via SUBCUTANEOUS
  Administered 2020-01-02 (×3): 3 [IU] via SUBCUTANEOUS
  Administered 2020-01-02: 17:00:00 8 [IU] via SUBCUTANEOUS
  Administered 2020-01-03: 2 [IU] via SUBCUTANEOUS
  Administered 2020-01-03: 5 [IU] via SUBCUTANEOUS
  Administered 2020-01-03: 01:00:00 2 [IU] via SUBCUTANEOUS
  Administered 2020-01-03: 8 [IU] via SUBCUTANEOUS
  Administered 2020-01-03: 17:00:00 5 [IU] via SUBCUTANEOUS
  Administered 2020-01-04: 3 [IU] via SUBCUTANEOUS
  Administered 2020-01-04: 11 [IU] via SUBCUTANEOUS
  Administered 2020-01-04: 8 [IU] via SUBCUTANEOUS
  Administered 2020-01-04: 5 [IU] via SUBCUTANEOUS
  Administered 2020-01-04: 2 [IU] via SUBCUTANEOUS
  Administered 2020-01-04: 5 [IU] via SUBCUTANEOUS
  Administered 2020-01-05: 3 [IU] via SUBCUTANEOUS
  Administered 2020-01-05 (×3): 2 [IU] via SUBCUTANEOUS
  Administered 2020-01-05 (×2): 3 [IU] via SUBCUTANEOUS
  Administered 2020-01-06: 11 [IU] via SUBCUTANEOUS
  Administered 2020-01-06 (×2): 5 [IU] via SUBCUTANEOUS
  Administered 2020-01-06: 20:00:00 11 [IU] via SUBCUTANEOUS
  Administered 2020-01-06 (×2): 8 [IU] via SUBCUTANEOUS
  Administered 2020-01-07: 5 [IU] via SUBCUTANEOUS

## 2020-01-01 MED ORDER — MIDAZOLAM HCL 2 MG/2ML IJ SOLN: 1.0000 mg | INTRAMUSCULAR | Status: DC | PRN

## 2020-01-01 MED ORDER — ASCORBIC ACID 500 MG PO TABS
500.0000 mg | ORAL_TABLET | Freq: Every day | ORAL | Status: DC
  Administered 2020-01-01 – 2020-01-07 (×7): 500 mg
  Filled 2020-01-01 (×7): qty 1

## 2020-01-01 MED ORDER — ORAL CARE MOUTH RINSE
15.0000 mL | OROMUCOSAL | Status: DC
  Administered 2020-01-01 – 2020-01-07 (×56): 15 mL via OROMUCOSAL

## 2020-01-01 MED ORDER — PRO-STAT SUGAR FREE PO LIQD
30.0000 mL | Freq: Every day | ORAL | Status: DC
  Administered 2020-01-02 – 2020-01-07 (×6): 30 mL
  Filled 2020-01-01 (×7): qty 30

## 2020-01-01 MED ORDER — ROCURONIUM BROMIDE 10 MG/ML (PF) SYRINGE
PREFILLED_SYRINGE | INTRAVENOUS | Status: AC
  Administered 2020-01-01: 70 mg via INTRAVENOUS
  Filled 2020-01-01: qty 10

## 2020-01-01 MED ORDER — PRO-STAT SUGAR FREE PO LIQD
30.0000 mL | Freq: Two times a day (BID) | ORAL | Status: DC
  Administered 2020-01-01: 30 mL
  Filled 2020-01-01: qty 30

## 2020-01-01 MED ORDER — FENTANYL CITRATE (PF) 100 MCG/2ML IJ SOLN: 25.0000 ug | Freq: Once | INTRAMUSCULAR | Status: AC

## 2020-01-01 MED ORDER — ETOMIDATE 2 MG/ML IV SOLN: 10.0000 mg | Freq: Once | INTRAVENOUS | Status: AC

## 2020-01-01 MED ORDER — ACETAMINOPHEN 325 MG PO TABS
650.0000 mg | ORAL_TABLET | Freq: Four times a day (QID) | ORAL | Status: DC | PRN
  Administered 2020-01-04 – 2020-01-07 (×2): 650 mg
  Filled 2020-01-01 (×2): qty 2

## 2020-01-01 MED ORDER — ETOMIDATE 2 MG/ML IV SOLN
INTRAVENOUS | Status: AC
  Administered 2020-01-01: 10 mg via INTRAVENOUS
  Filled 2020-01-01: qty 20

## 2020-01-01 MED ORDER — MIDAZOLAM HCL 2 MG/2ML IJ SOLN
INTRAMUSCULAR | Status: AC
  Administered 2020-01-01: 1 mg via INTRAVENOUS
  Filled 2020-01-01: qty 4

## 2020-01-01 MED ORDER — VITAL AF 1.2 CAL PO LIQD
1000.0000 mL | ORAL | Status: DC
  Administered 2020-01-01 – 2020-01-06 (×3): 1000 mL

## 2020-01-01 MED ORDER — FENTANYL CITRATE (PF) 100 MCG/2ML IJ SOLN: 25.0000 ug | INTRAMUSCULAR | Status: DC | PRN

## 2020-01-01 MED ORDER — CHLORHEXIDINE GLUCONATE 0.12% ORAL RINSE (MEDLINE KIT)
15.0000 mL | Freq: Two times a day (BID) | OROMUCOSAL | Status: DC
  Administered 2020-01-01 – 2020-01-04 (×6): 15 mL via OROMUCOSAL

## 2020-01-01 MED ORDER — FAMOTIDINE 20 MG PO TABS
20.0000 mg | ORAL_TABLET | Freq: Two times a day (BID) | ORAL | Status: DC
  Administered 2020-01-01 – 2020-01-05 (×9): 20 mg
  Filled 2020-01-01 (×9): qty 1

## 2020-01-01 MED ORDER — LACTATED RINGERS IV BOLUS
1000.0000 mL | Freq: Once | INTRAVENOUS | Status: AC
  Administered 2020-01-01: 1000 mL via INTRAVENOUS

## 2020-01-01 MED ORDER — METOPROLOL TARTRATE 25 MG PO TABS
50.0000 mg | ORAL_TABLET | Freq: Two times a day (BID) | ORAL | Status: DC
  Administered 2020-01-01 – 2020-01-07 (×12): 50 mg
  Filled 2020-01-01 (×13): qty 2

## 2020-01-01 NOTE — Progress Notes (Signed)
Pt understands POC but still anxious to go home when able.  Discussed bed-time with patient to help with better sleep.  Messaged MD and received order to D/C telemetry.  Discussed Trazodone for sleep-aid.  Pt not to have Vitals taken unless he is awake so that he can have possible good night sleep.

## 2020-01-01 NOTE — Progress Notes (Signed)
Patient's cell phone sent to security to be safely stored while patient is in ICU.

## 2020-01-01 NOTE — Progress Notes (Signed)
Patient has been sleeping well tonight, but has been pulling off [place] while sleeping, sats drop as low as 76% as informed by [facility] [place] staff.  Oxygen put back on patient x 6 this shift.

## 2020-01-01 NOTE — Progress Notes (Signed)
Pt transported on ventilator to CT and back with RN. VSS throughout, RT will continue to monitor.

## 2020-01-01 NOTE — Progress Notes (Signed)
Received a call at [phone] from [place], informed that  patient sats had dropped as low as 69%.  Went in room, patient kept pulling [place] off throughout the night.  Upon entering the room [place] still in place and patient breathing through his mouth.  Switched over to non-rebreather and sats slowiy improved to 90%.  Tried to arouse patient and was unable with sternal rub.  Code blue called.  Additional staff to bedside,  VSS.  Informed by ICU charge RN that patient was being taken to ICU and would be intubated at that location.  Informed Dr. Vanita Ingles of patient condition, states that he will call and update patient wife.

## 2020-01-01 NOTE — Progress Notes (Signed)
Rapid Response Event Note  Overview:  Code Blue announced - On arrival, Primary RN stated patient's eyes rolled back in his head and he was shaking.  Decided to bring patient up to ICU to be intubated d/t inability to clear on secretions.      Initial Focused Assessment: N: Unarousable R: Tenacious secretions blocking oral airway   Interventions: Airway suctioned by RT. Transferred to ICU for intubation  Plan of Care (if not transferred):  Event Summary:   at  0705 Arrived to patient room    at  0708 Suctioned   at [facility] in [place]

## 2020-01-01 NOTE — Progress Notes (Signed)
[facility] Brief note  Called to code blue 0650 this morning Briefly, 79 yo male who lives independently treated here for COVID pneumonia, requiring 10L O2 yesterday, was pulling off oxygen all night, but remained responsive, talkative.  At baseline was having conversation with attending MD yesterday.  This morning he pulled off his O2 and his O2 saturation dropped to 60% and he became unresponsive.  Remained unresponsive after placing O2 back and with return of normal O2 saturation.  Thick secretions in airway, no cough.  Brought to ICU, intubated, vent, sedation orders were written.  I think that this acute encephalopathy was mostly driven by hypoxemia, but agree that performing a head CT is reasonable as well.  Will order head CT, hand off to day team for further care.  Roselie Awkward, MD [facility] Pager: [phone] Cell: [phone] If no response, call [phone]

## 2020-01-01 NOTE — Progress Notes (Signed)
 Initial Nutrition Assessment  DOCUMENTATION CODES:   Not applicable  INTERVENTION:   Tube Feeding:  Vital AF 1.2 at 55 ml/hr  Pro-Stat 30 mL daily  TF regimen provides 114 g of protein, 1684 kcals and 1069 mL of free water  TF regimen and propofol at current rate providing 1972 total kcal/day    NUTRITION DIAGNOSIS:   Inadequate oral intake related to acute illness as evidenced by NPO status.   GOAL:   Patient will meet greater than or equal to 90% of their needs  MONITOR:   Vent status, Skin, TF tolerance, Weight trends, Labs  REASON FOR ASSESSMENT:   Consult, Ventilator Enteral/tube feeding initiation and management  ASSESSMENT:   79 yo male admitted on 1/11 with acute respiratory failure with COVID 19 pneumonia with decline requiring intubation on 1/21. PMH DM, HTN, CKD  RD working remotely.  1/11 Admit 1/21 Intubated, CXR with pneumomediastinum  Patient is currently intubated on ventilator support MV: 11.4 L/min Temp (24hrs), Avg:97.8 F (36.6 C), Min:97.7 F (36.5 C), Max:97.9 F (36.6 C)  Propofol: 10.9 ml/hr (288 kcals/24 hours)  Unable to obtain diet and weight history at this time  Recorded po intake 5-100% of meals prior to intubation  Weight 72.2 kg on admit (1/11); no new weight since  Labs:reviewed Meds: Vic C, decadron, ss novolog, levemir, LR at 30 ml/hr, zinc sulfate   Diet Order:   Diet Order            Diet NPO time specified  Diet effective now              EDUCATION NEEDS:   Not appropriate for education at this time  Skin:  Skin Assessment: Reviewed RN Assessment  Last BM:  1/21  Height:   Ht Readings from Last 1 Encounters:  01/10/2020 5\' 8"  (1.727 m)    Weight:   Wt Readings from Last 1 Encounters:  01/09/2020 72.2 kg    BMI:  Body mass index is 24.21 kg/m.  Estimated Nutritional Needs:   Kcal:  1618 kcals (PSU), 1800-2100 kcals (kcals/kg)  Protein:  105-125 g  Fluid:  >/= 1.8 L    Suly Vukelich  MS, RDN, LDN, CNSC [phone] Pager  [phone] Weekend/On-Call Pager

## 2020-01-01 NOTE — Progress Notes (Signed)
Updated wife Bethena Roys, questions and concerns addressed. No issues at this time.

## 2020-01-01 NOTE — Progress Notes (Signed)
Spoke with pt's wife.  Updated about events of this morning.  Explained CT head was normal.  Explained that he has pneumomediastinum.  Difficult to say if this happened prior to intubation, and if so could have been cause of worsening hypoxia.    Chesley Mires, MD [facility] Pulmonary/Critical Care 01/01/2020, 1:34 PM

## 2020-01-01 NOTE — Progress Notes (Signed)
Pt wife updated and all questions answered.  

## 2020-01-01 NOTE — Procedures (Signed)
Intubation Procedure Note Terrence Cisneros [id number] [birth year]  Procedure: Intubation Indications: Airway protection and maintenance  Procedure Details Consent: Risks of procedure as well as the alternatives and risks of each were explained to the (patient/caregiver).  Consent for procedure obtained. Time Out: Verified patient identification, verified procedure, site/side was marked, verified correct patient position, special equipment/implants available, medications/allergies/relevent history reviewed, required imaging and test results available.  Performed  Drugs Etomidate 51m, versed 146m fentanyl 5023m rocuronium 70m37m x 1 with MAC 4 blade Grade 1 view 8.0 ET tube passed through cords under direct visualization Placement confirmed with bilateral breath sounds, positive EtCO2 change and smoke in tube   Evaluation Hemodynamic Status: BP stable throughout; O2 sats: stable throughout Patient's Current Condition: stable Complications: No apparent complications Patient did tolerate procedure well. Chest X-ray ordered to verify placement.  CXR: pending.   Terrence Awkward1/2021

## 2020-01-01 NOTE — Consult Note (Signed)
NAME:  Terrence Cisneros, MRN:  [id number], DOB:  November 05, 1941, LOS: 10 ADMISSION DATE:  01/03/2020, CONSULTATION DATE:  01/01/2020 REFERRING MD:  Dr. Cruzita Lederer, [facility], CHIEF COMPLAINT:  Altered mental status   Brief History   79 yo male presented with nausea, dyspnea, cough.  Didn't improve with outpt antibiotic and prednisone.  Found to have hypoxia from COVID 19 pneumonia.  Reports having exposure to COVID during Christmas.  Noted to have elevated procalcitonin also and treated with ABx for CAP.  Had transient A fib with RVR.  Developed worsening hypoxia with altered mental status 1/21 and transferred to ICU.  Past Medical History  Hypothyroidism, OA, CKD 2, Allergies, HTN, DM, Neuropathy  Significant Hospital Events   1/11 Admit 1/13 A fib with RVR, add amiodarone 1/17 d/c amiodarone and start lopressor 1/20 transition from lovenox to eliquis 1/21 worsening hypoxia with altered mental status >> to ICU and intubated  Consults:    Procedures:  ETT 1/21 >>   Significant Diagnostic Tests:  CT angio chest 1/12 >> b/l GGO, atherosclerosis CT head 1/21 >> CT chest 1/21 >>   Micro Data:  SARS CoV2 Ag 1/11 >> Positive Sputum 1/21 >>   COVID Therapy:  Decadron 1/11 >> Remdesivir 1/11 >> 1/15 Convalescent plasma 1/14 >>   Antimicrobial Therapy:  Rocephin 1/11 >> 1/15 Zithromax 1/11 >> 1/15   Interim history/subjective:    Objective   Blood pressure (!) 144/74, pulse 84, temperature 97.9 F (36.6 C), temperature source Oral, resp. rate (!) 22, height 5\' 8"  (1.727 m), weight 72.2 kg, SpO2 100 %.    FiO2 (%):  [100 %] 100 %  No intake or output data in the 24 hours ending 01/01/20 0922 Filed Weights   12/13/2019 0956 01/06/2020 2100  Weight: 77.1 kg 72.2 kg    Examination:  General - sedated Eyes - pupils reactive ENT - ETT in place, mild crepitus around neck Cardiac - regular rate/rhythm, no murmur Chest - b/l rhonchi Abdomen - soft, non tender, + bowel sounds Extremities  - no cyanosis, clubbing, or edema Skin - no rashes Neuro - RASS -3  CXR (reviewed by me) - pneumomediastinum with subcutaneous emphysema.  Likely has air tracking from pneumomediastinum giving appearance of small pneumothorax.   Resolved Hospital Problem list     Assessment & Plan:   Acute hypoxic respiratory failure from COVID 19 pneumonia and Community acquired pneumonia with worsening hypoxia 1/21. - full vent support - d/c decadron 1/21 - not candidate for actemera due to concern for bacterial infection on admission - repeat sputum culture  Pneumomediastinum noted on CXR 1/21. - will get CT chest with IV contrast  Acute metabolic encephalopathy 2nd to hypoxia. - will get CT head - RASS goal 0 to -1 while on vent - bradycardia with precedex >> change to diprivan with prn versed, fentanyl  New onset PAF with RVR >> now in sinus rhythm. Hx of HTN. 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 - SSI with levemir  Anemia of critical illness. - f/u CBC  Best practice:  Diet: tube feeds DVT prophylaxis: eliquis GI prophylaxis: pepcid Mobility: bed rest Code Status: full code Disposition: ICU  Labs   CBC: Recent Labs  Lab 12/26/19 0148 12/26/19 0148 12/27/19 0436 12/27/19 0436 12/28/19 0326 12/28/19 0326 12/29/19 0650 12/30/19 0215 12/31/19 0425 01/01/20 0615 01/01/20 0826  WBC 21.3*   < > 26.7*   < > 20.5*  --  27.1*  22.8* 21.1* 26.1*  --   NEUTROABS 18.8*  --  23.4*  --  18.4*  --  23.9*  --   --   --   --   HGB 11.7*   < > 13.1   < > 11.8*   < > 13.0 12.0* 12.0* 12.0* 10.5*  HCT 35.1*   < > 39.3   < > 34.9*   < > 38.7* 35.7* 35.1* 35.9* 31.0*  MCV 87.8   < > 87.3   < > 86.4  --  88.2 86.9 87.5 88.4  --   PLT 374   < > 499*   < > 412*  --  525* 404* 335 275  --    < > = values in this interval not displayed.    Basic Metabolic Panel: Recent Labs  Lab 12/26/19 0148 12/26/19 0148 12/27/19 [id number]  12/27/19 [id number] 12/28/19 0326 12/28/19 0326 12/29/19 [id number] 12/30/19 0215 12/31/19 0425 01/01/20 0615 01/01/20 0826  NA 140   < > 141   < > 140   < > 138 136 135 134* 135  K 4.2   < > 4.0   < > 4.0   < > 4.3 4.7 4.4 4.8 4.3  CL 107   < > 104   < > 106  --  102 104 103 99  --   CO2 23   < > 22   < > 22  --  24 21* 21* 22  --   GLUCOSE 168*   < > 152*   < > 189*  --  63* 61* 63* 110*  --   BUN 42*   < > 45*   < > 44*  --  41* 44* 44* 48*  --   CREATININE 1.38*   < > 1.30*   < > 1.29*  --  1.21 1.26* 1.27* 1.21  --   CALCIUM 8.9   < > 8.7*   < > 8.4*  --  8.7* 8.7* 8.5* 8.4*  --   MG 2.2  --  2.2  --  2.0  --  1.9  --   --  2.2  --   PHOS 2.2*   < > 3.5  --  3.9  --  2.9 4.1  --  4.1  --    < > = values in this interval not displayed.   GFR: Estimated Creatinine Clearance: 48.7 mL/min (by C-G formula based on SCr of 1.21 mg/dL). Recent Labs  Lab 12/26/19 0148 12/27/19 0436 12/27/19 0900 12/28/19 0326 12/29/19 0650 12/30/19 0215 12/31/19 0425 01/01/20 0615  PROCALCITON 4.12  --  1.45  --   --   --   --   --   WBC 21.3*   < >  --    < > 27.1* 22.8* 21.1* 26.1*   < > = values in this interval not displayed.    Liver Function Tests: Recent Labs  Lab 12/28/19 0326 12/29/19 0650 12/30/19 0215 12/31/19 0425 01/01/20 0615  AST 63* 58* 53* 36 29  ALT 96* 94* 79* 62* 49*  ALKPHOS 130* 179* 192* 168* 139*  BILITOT 1.0 0.7 0.9 1.1 0.9  PROT 6.3* 6.9 6.3* 6.3* 6.0*  ALBUMIN 2.9* 3.2* 2.9* 2.8* 2.6*   No results for input(s): LIPASE, AMYLASE in the last 168 hours. No results for input(s): AMMONIA in the last 168 hours.  ABG    Component Value Date/Time   PHART 7.363 01/01/2020 0826   PCO2ART 37.3  01/01/2020 0826   PO2ART 170.0 (H) 01/01/2020 0826   HCO3 21.2 01/01/2020 0826   TCO2 22 01/01/2020 0826   ACIDBASEDEF 4.0 (H) 01/01/2020 0826   O2SAT 99.0 01/01/2020 0826     Coagulation Profile: No results for input(s): INR, PROTIME in the last 168 hours.  Cardiac  Enzymes: No results for input(s): CKTOTAL, CKMB, CKMBINDEX, TROPONINI in the last 168 hours.  HbA1C: Hgb A1c MFr Bld  Date/Time Value Ref Range Status  12/23/2019 11:05 AM 7.4 (H) 4.8 - 5.6 % Final    Comment:    (NOTE) Pre diabetes:          5.7%-6.4% Diabetes:              >6.4% Glycemic control for   <7.0% adults with diabetes   06/11/2019 08:56 AM 6.4 (H) 4.8 - 5.6 % Final    Comment:    (NOTE) Pre diabetes:          5.7%-6.4% Diabetes:              >6.4% Glycemic control for   <7.0% adults with diabetes     CBG: Recent Labs  Lab 12/31/19 1241 12/31/19 1640 12/31/19 2152 01/01/20 0648 01/01/20 0827  GLUCAP 179* 262* 251* 131* 145*    Review of Systems:   Unable to obtain  Past Medical History  He,  has a past medical history of Acquired hypothyroidism, Arthritis, Cervical radiculopathy, Chronic kidney disease, Environmental allergies, Hypertension, Neuropathy, and Type 2 diabetes mellitus ([place]).   Surgical History    Past Surgical History:  Procedure Laterality Date  . CHOLECYSTECTOMY  02/03/2012   Procedure: LAPAROSCOPIC CHOLECYSTECTOMY WITH INTRAOPERATIVE CHOLANGIOGRAM;  Surgeon: Shann Medal, MD;  Location: [facility];  Service: General;  Laterality: N/A;  . COLONOSCOPY W/ POLYPECTOMY    . KIDNEY STONE SURGERY  90s   some i passed, some they blasted, some they went in to get   . TONSILLECTOMY    . TOTAL KNEE ARTHROPLASTY Right 06/16/2019   Procedure: TOTAL KNEE ARTHROPLASTY;  Surgeon: Vickey Huger, MD;  Location: [facility];  Service: Orthopedics;  Laterality: Right;     Social History   reports that he has never smoked. He has never used smokeless tobacco. He reports that he does not drink alcohol or use drugs.   Family History   His family history includes Hypertension in an other family member.   Allergies Allergies  Allergen Reactions  . Lisinopril Other (See Comments)    BP dropped very low     Home Medications  Prior to Admission medications    Medication Sig Start Date End Date Taking? Authorizing Provider  acetaminophen (TYLENOL) 650 MG CR tablet Take 1,300 mg by mouth every 8 (eight) hours as needed for pain.   Yes [provider]  Ascorbic Acid (VITA-C PO) Take 1 tablet by mouth daily.   Yes [provider]  aspirin EC 81 MG tablet Take 81 mg by mouth every other day.   Yes [provider]  losartan-hydrochlorothiazide (HYZAAR) 100-25 MG tablet Take 0.5 tablets by mouth daily.  12/14/17  Yes [provider]  metFORMIN (GLUCOPHAGE) 500 MG tablet Take 500 mg by mouth 2 (two) times daily with a meal.  01/31/19  Yes [provider]  Multiple Vitamins-Minerals (ZINC PO) Take 1 tablet by mouth daily.   Yes [provider]  amoxicillin-clavulanate (AUGMENTIN) 875-125 MG tablet Take 1 tablet by mouth 2 (two) times daily. Patient not taking: Reported on 12/24/2019 07/09/19  Landis Martins, DPM  aspirin EC 325 MG EC tablet Take 1 tablet (325 mg total) by mouth 2 (two) times daily. Patient not taking: Reported on 12/14/2019 06/17/19   Donia Ast, PA  ciprofloxacin (CIPRO) 250 MG tablet Take 1 tablet (250 mg total) by mouth 2 (two) times daily. Patient not taking: Reported on 01/11/2020 07/07/19   Evelina Bucy, DPM  clindamycin (CLEOCIN) 300 MG capsule Take 1 capsule (300 mg total) by mouth 2 (two) times daily. Patient not taking: Reported on 12/15/2019 07/07/19   Evelina Bucy, DPM  methocarbamol (ROBAXIN) 500 MG tablet Take 1-2 tablets (500-1,000 mg total) by mouth every 6 (six) hours as needed for muscle spasms. Patient not taking: Reported on 12/20/2019 06/17/19   Donia Ast, PA  oxyCODONE (OXY IR/ROXICODONE) 5 MG immediate release tablet Take 1-2 tablets (5-10 mg total) by mouth every 6 (six) hours as needed for moderate pain (pain score 4-6). Patient not taking: Reported on 12/29/2019 06/17/19   Donia Ast, [place]     Critical care time: 25 minutes    Chesley Mires,  MD Secretary 01/01/2020, 9:53 AM

## 2020-01-02 ENCOUNTER — Inpatient Hospital Stay (HOSPITAL_COMMUNITY): Payer: PPO

## 2020-01-02 DIAGNOSIS — E039 Hypothyroidism, unspecified: Secondary | ICD-10-CM

## 2020-01-02 LAB — COMPREHENSIVE METABOLIC PANEL
ALT: 45 U/L — ABNORMAL HIGH (ref 0–44)
AST: 25 U/L (ref 15–41)
Albumin: 2.4 g/dL — ABNORMAL LOW (ref 3.5–5.0)
Alkaline Phosphatase: 122 U/L (ref 38–126)
Anion gap: 11 (ref 5–15)
BUN: 59 mg/dL — ABNORMAL HIGH (ref 8–23)
CO2: 21 mmol/L — ABNORMAL LOW (ref 22–32)
Calcium: 7.9 mg/dL — ABNORMAL LOW (ref 8.9–10.3)
Chloride: 101 mmol/L (ref 98–111)
Creatinine, Ser: 1.37 mg/dL — ABNORMAL HIGH (ref 0.61–1.24)
GFR calc Af Amer: 57 mL/min — ABNORMAL LOW (ref >60)
GFR calc non Af Amer: 49 mL/min — ABNORMAL LOW (ref >60)
Glucose, Bld: 160 mg/dL — ABNORMAL HIGH (ref 70–99)
Potassium: 4.8 mmol/L (ref 3.5–5.1)
Sodium: 133 mmol/L — ABNORMAL LOW (ref 135–145)
Total Bilirubin: 0.6 mg/dL (ref 0.3–1.2)
Total Protein: 5.8 g/dL — ABNORMAL LOW (ref 6.5–8.1)

## 2020-01-02 LAB — CBC
HCT: 35.1 % — ABNORMAL LOW (ref 39.0–52.0)
Hemoglobin: 11.6 g/dL — ABNORMAL LOW (ref 13.0–17.0)
MCH: 30 pg (ref 26.0–34.0)
MCHC: 33 g/dL (ref 30.0–36.0)
MCV: 90.7 fL (ref 80.0–100.0)
Platelets: 221 10*3/uL (ref 150–400)
RBC: 3.87 MIL/uL — ABNORMAL LOW (ref 4.22–5.81)
RDW: 15 % (ref 11.5–15.5)
WBC: 24.8 10*3/uL — ABNORMAL HIGH (ref 4.0–10.5)
nRBC: 0 % (ref 0.0–0.2)

## 2020-01-02 LAB — D-DIMER, QUANTITATIVE: D-Dimer, Quant: >20 ug/mL-FEU — ABNORMAL HIGH (ref 0.00–0.50)

## 2020-01-02 LAB — C-REACTIVE PROTEIN: CRP: 3.7 mg/dL — ABNORMAL HIGH (ref <1.0)

## 2020-01-02 LAB — MAGNESIUM
Magnesium: 2.2 mg/dL (ref 1.7–2.4)
Magnesium: 2.1 mg/dL (ref 1.7–2.4)

## 2020-01-02 LAB — GLUCOSE, CAPILLARY
Glucose-Capillary: 173 mg/dL — ABNORMAL HIGH (ref 70–99)
Glucose-Capillary: 185 mg/dL — ABNORMAL HIGH (ref 70–99)
Glucose-Capillary: 187 mg/dL — ABNORMAL HIGH (ref 70–99)
Glucose-Capillary: 212 mg/dL — ABNORMAL HIGH (ref 70–99)
Glucose-Capillary: 229 mg/dL — ABNORMAL HIGH (ref 70–99)
Glucose-Capillary: 261 mg/dL — ABNORMAL HIGH (ref 70–99)

## 2020-01-02 LAB — PHOSPHORUS
Phosphorus: 4.1 mg/dL (ref 2.5–4.6)
Phosphorus: 3.1 mg/dL (ref 2.5–4.6)

## 2020-01-02 LAB — TRIGLYCERIDES: Triglycerides: 148 mg/dL (ref <150)

## 2020-01-02 NOTE — Progress Notes (Signed)
Updated patient's wife, Bethena Roys on patient condition and plan of care. All questions answered at this time.

## 2020-01-02 NOTE — Progress Notes (Signed)
NAME:  Terrence Cisneros, MRN:  [id number], DOB:  28-Jun-1941, LOS: 11 ADMISSION DATE:  12/12/2019, CONSULTATION DATE:  1/22 REFERRING MD:  Vanita Ingles, CHIEF COMPLAINT:  dyspnea   Brief History   79 yo male presented with nausea, dyspnea, cough.  Didn't improve with outpt antibiotic and prednisone.  Found to have hypoxia from COVID 19 pneumonia.  Reports having exposure to COVID during Christmas.  Noted to have elevated procalcitonin also and treated with ABx for CAP.  Had transient A fib with RVR.  Developed worsening hypoxia with altered mental status 1/21 and transferred to ICU.  Past Medical History  Hypothyroidism, OA, CKD 2, Allergies, HTN, DM, Neuropathy  Significant Hospital Events   1/11 Admit 1/13 A fib with RVR, add amiodarone 1/17 d/c amiodarone and start lopressor 1/20 transition from lovenox to eliquis 1/21 worsening hypoxia with altered mental status >> to ICU and intubated  Consults:  PCCM  Procedures:  1/21 ETT >   Significant Diagnostic Tests:  CT angio chest 1/12 >> b/l GGO, atherosclerosis CT head 1/21 >> retropharyngeal gas, NAICP CT chest 1/21 >> small right sided pneumothorax, extensive pneumomediastinum, bilatearl infiltrates consistent with COVID 19  Micro Data:  SARS CoV2 Ag 1/11 >> Positive Sputum 1/21 >> yeast, GPC  Antimicrobials:  Decadron 1/11 >  Remdesivir 1/11 >> 1/15 Convalescent plasma 1/14 >> Rocephin 1/11 >> 1/15 Zithromax 1/11 >> 1/15   Interim history/subjective:  Passing SBT  Objective   Blood pressure (!) 122/58, pulse (!) 59, temperature 98.1 F (36.7 C), temperature source Oral, resp. rate 18, height 5\' 8"  (1.727 m), weight 72.2 kg, SpO2 98 %.    Vent Mode: PRVC FiO2 (%):  [40 %-100 %] 40 % Set Rate:  [22 bmp] 22 bmp Vt Set:  [480 mL] 480 mL PEEP:  [8 cmH20] 8 cmH20 Plateau Pressure:  [21 cmH20-25 cmH20] 24 cmH20   Intake/Output Summary (Last 24 hours) at 01/02/2020 [id number] Last data filed at 01/02/2020 0600 Gross per 24 hour    Intake 1864.77 ml  Output 495 ml  Net 1369.77 ml   Filed Weights   12/18/2019 0956 12/21/2019 2100  Weight: 77.1 kg 72.2 kg    Examination:  General:  In bed on vent HENT: NCAT ETT in place PULM: CTA B, vent supported breathing CV: RRR, no mgr GI: BS+, soft, nontender MSK: normal bulk and tone Neuro: sedated on vent but will localize to pain  1/22 CXR >> personally reviewed, no clear pneumothorax seen, bilateral infiltrates  Resolved Hospital Problem list     Assessment & Plan:  ARDS due to COVID 19 : this morning he is pasing an SBT, oxygenation favors extubation, mental status uncertain Wean on pressure support as long as tolerated  Stop sedation, if awake enough then extubate  Resp culture positive for GPC Monitor for fever  Need for sedation WUA now  Pneumomediastinum Small R pneumothorax Daily CXR  Extubate if able  Acute toxic metabolic encephalopathy Stop sedation and assess today  Paroxyxsmal Atrial fib In sinus rhythm Tele Metoprolol eliquis  DM2 SSI + detemir Accucheck q4h  Anemia of chronic disease Monitor for bleeding Monitor for bleeding Transfuse PRBC for Hgb < 7 gm/dL   Best practice:  Diet: tube feeding, continue today Pain/Anxiety/Delirium protocol (if indicated): yes, RASS target 0 to -1 VAP protocol (if indicated): yes DVT prophylaxis: Eliquis GI prophylaxis: famotidine Glucose control: SSI Mobility: bed rest Code Status: full Family Communication: I updated his wife Bethena Roys 1/22 on plan of care Disposition: remain  in ICU  Labs   CBC: Recent Labs  Lab 12/27/19 0436 12/27/19 0436 12/28/19 0326 12/28/19 0326 12/29/19 0650 12/29/19 0650 12/30/19 0215 12/31/19 0425 01/01/20 0615 01/01/20 0826 01/02/20 0500  WBC 26.7*   < > 20.5*   < > 27.1*  --  22.8* 21.1* 26.1*  --  24.8*  NEUTROABS 23.4*  --  18.4*  --  23.9*  --   --   --   --   --   --   HGB 13.1   < > 11.8*   < > 13.0   < > 12.0* 12.0* 12.0* 10.5* 11.6*  HCT 39.3    < > 34.9*   < > 38.7*   < > 35.7* 35.1* 35.9* 31.0* 35.1*  MCV 87.3   < > 86.4   < > 88.2  --  86.9 87.5 88.4  --  90.7  PLT 499*   < > 412*   < > 525*  --  404* 335 275  --  221   < > = values in this interval not displayed.    Basic Metabolic Panel: Recent Labs  Lab 12/28/19 0326 12/28/19 0326 12/29/19 0650 12/29/19 0650 12/30/19 0215 12/31/19 0425 01/01/20 0615 01/01/20 0826 01/01/20 1651 01/02/20 0500  NA 140   < > 138   < > 136 135 134* 135  --  133*  K 4.0   < > 4.3   < > 4.7 4.4 4.8 4.3  --  4.8  CL 106   < > 102  --  104 103 99  --   --  101  CO2 22   < > 24  --  21* 21* 22  --   --  21*  GLUCOSE 189*   < > 63*  --  61* 63* 110*  --   --  160*  BUN 44*   < > 41*  --  44* 44* 48*  --   --  59*  CREATININE 1.29*   < > 1.21  --  1.26* 1.27* 1.21  --   --  1.37*  CALCIUM 8.4*   < > 8.7*  --  8.7* 8.5* 8.4*  --   --  7.9*  MG 2.0  --  1.9  --   --   --  2.2  --  2.1 2.2  PHOS 3.9   < > 2.9  --  4.1  --  4.1  --  4.9* 4.1   < > = values in this interval not displayed.   GFR: Estimated Creatinine Clearance: 43 mL/min (A) (by C-G formula based on SCr of 1.37 mg/dL (H)). Recent Labs  Lab 12/27/19 0900 12/28/19 0326 12/30/19 0215 12/31/19 0425 01/01/20 0615 01/02/20 0500  PROCALCITON 1.45  --   --   --   --   --   WBC  --    < > 22.8* 21.1* 26.1* 24.8*   < > = values in this interval not displayed.    Liver Function Tests: Recent Labs  Lab 12/29/19 0650 12/30/19 0215 12/31/19 0425 01/01/20 0615 01/02/20 0500  AST 58* 53* 36 29 25  ALT 94* 79* 62* 49* 45*  ALKPHOS 179* 192* 168* 139* 122  BILITOT 0.7 0.9 1.1 0.9 0.6  PROT 6.9 6.3* 6.3* 6.0* 5.8*  ALBUMIN 3.2* 2.9* 2.8* 2.6* 2.4*   No results for input(s): LIPASE, AMYLASE in the last 168 hours. No results for input(s): AMMONIA in the last 168 hours.  ABG    Component Value Date/Time   PHART 7.363 01/01/2020 0826   PCO2ART 37.3 01/01/2020 0826   PO2ART 170.0 (H) 01/01/2020 0826   HCO3 21.2 01/01/2020  0826   TCO2 22 01/01/2020 0826   ACIDBASEDEF 4.0 (H) 01/01/2020 0826   O2SAT 99.0 01/01/2020 0826     Coagulation Profile: No results for input(s): INR, PROTIME in the last 168 hours.  Cardiac Enzymes: No results for input(s): CKTOTAL, CKMB, CKMBINDEX, TROPONINI in the last 168 hours.  HbA1C: Hgb A1c MFr Bld  Date/Time Value Ref Range Status  12/23/2019 11:05 AM 7.4 (H) 4.8 - 5.6 % Final    Comment:    (NOTE) Pre diabetes:          5.7%-6.4% Diabetes:              >6.4% Glycemic control for   <7.0% adults with diabetes   06/11/2019 08:56 AM 6.4 (H) 4.8 - 5.6 % Final    Comment:    (NOTE) Pre diabetes:          5.7%-6.4% Diabetes:              >6.4% Glycemic control for   <7.0% adults with diabetes     CBG: Recent Labs  Lab 01/01/20 1339 01/01/20 1718 01/01/20 2010 01/02/20 0037 01/02/20 0442  GLUCAP 133* 140* 207* 212* 187*       Critical care time: 40 minutes       Roselie Awkward, MD [facility] Pager: [phone] Cell: [phone] If no response, call [phone]

## 2020-01-03 ENCOUNTER — Inpatient Hospital Stay (HOSPITAL_COMMUNITY): Payer: PPO

## 2020-01-03 LAB — CBC WITH DIFFERENTIAL/PLATELET
Abs Immature Granulocytes: 0.47 10*3/uL — ABNORMAL HIGH (ref 0.00–0.07)
Basophils Absolute: 0.1 10*3/uL (ref 0.0–0.1)
Basophils Relative: 0 %
Eosinophils Absolute: 0 10*3/uL (ref 0.0–0.5)
Eosinophils Relative: 0 %
HCT: 35.8 % — ABNORMAL LOW (ref 39.0–52.0)
Hemoglobin: 11.9 g/dL — ABNORMAL LOW (ref 13.0–17.0)
Immature Granulocytes: 2 %
Lymphocytes Relative: 3 %
Lymphs Abs: 0.7 10*3/uL (ref 0.7–4.0)
MCH: 29.7 pg (ref 26.0–34.0)
MCHC: 33.2 g/dL (ref 30.0–36.0)
MCV: 89.3 fL (ref 80.0–100.0)
Monocytes Absolute: 1.4 10*3/uL — ABNORMAL HIGH (ref 0.1–1.0)
Monocytes Relative: 5 %
Neutro Abs: 26.6 10*3/uL — ABNORMAL HIGH (ref 1.7–7.7)
Neutrophils Relative %: 90 %
Platelets: 208 10*3/uL (ref 150–400)
RBC: 4.01 MIL/uL — ABNORMAL LOW (ref 4.22–5.81)
RDW: 14.9 % (ref 11.5–15.5)
WBC: 29.3 10*3/uL — ABNORMAL HIGH (ref 4.0–10.5)
nRBC: 0 % (ref 0.0–0.2)

## 2020-01-03 LAB — COMPREHENSIVE METABOLIC PANEL
ALT: 40 U/L (ref 0–44)
AST: 22 U/L (ref 15–41)
Albumin: 2.2 g/dL — ABNORMAL LOW (ref 3.5–5.0)
Alkaline Phosphatase: 110 U/L (ref 38–126)
Anion gap: 12 (ref 5–15)
BUN: 75 mg/dL — ABNORMAL HIGH (ref 8–23)
CO2: 21 mmol/L — ABNORMAL LOW (ref 22–32)
Calcium: 7.8 mg/dL — ABNORMAL LOW (ref 8.9–10.3)
Chloride: 102 mmol/L (ref 98–111)
Creatinine, Ser: 1.93 mg/dL — ABNORMAL HIGH (ref 0.61–1.24)
GFR calc Af Amer: 38 mL/min — ABNORMAL LOW (ref >60)
GFR calc non Af Amer: 32 mL/min — ABNORMAL LOW (ref >60)
Glucose, Bld: 122 mg/dL — ABNORMAL HIGH (ref 70–99)
Potassium: 4.6 mmol/L (ref 3.5–5.1)
Sodium: 135 mmol/L (ref 135–145)
Total Bilirubin: 0.7 mg/dL (ref 0.3–1.2)
Total Protein: 5.6 g/dL — ABNORMAL LOW (ref 6.5–8.1)

## 2020-01-03 LAB — MRSA PCR SCREENING: MRSA by PCR: NEGATIVE

## 2020-01-03 LAB — GLUCOSE, CAPILLARY
Glucose-Capillary: 104 mg/dL — ABNORMAL HIGH (ref 70–99)
Glucose-Capillary: 114 mg/dL — ABNORMAL HIGH (ref 70–99)
Glucose-Capillary: 132 mg/dL — ABNORMAL HIGH (ref 70–99)
Glucose-Capillary: 136 mg/dL — ABNORMAL HIGH (ref 70–99)
Glucose-Capillary: 242 mg/dL — ABNORMAL HIGH (ref 70–99)
Glucose-Capillary: 244 mg/dL — ABNORMAL HIGH (ref 70–99)
Glucose-Capillary: 271 mg/dL — ABNORMAL HIGH (ref 70–99)

## 2020-01-03 LAB — TRIGLYCERIDES: Triglycerides: 83 mg/dL (ref <150)

## 2020-01-03 NOTE — Progress Notes (Signed)
NAME:  Terrence Cisneros, MRN:  [id number], DOB:  01-21-1941, LOS: 12 ADMISSION DATE:  12/21/2019, CONSULTATION DATE:  1/22 REFERRING MD:  Vanita Ingles, CHIEF COMPLAINT:  dyspnea   Brief History   79 yo male presented with nausea, dyspnea, cough.  Didn't improve with outpt antibiotic and prednisone.  Found to have hypoxia from COVID 19 pneumonia.  Reports having exposure to COVID during Christmas.  Noted to have elevated procalcitonin also and treated with ABx for CAP.  Had transient A fib with RVR.  Developed worsening hypoxia with altered mental status 1/21 and transferred to ICU.  Past Medical History  Hypothyroidism, OA, CKD 2, Allergies, HTN, DM, Neuropathy  Significant Hospital Events   1/11 Admit 1/13 A fib with RVR, add amiodarone 1/17 d/c amiodarone and start lopressor 1/20 transition from lovenox to eliquis 1/21 worsening hypoxia with altered mental status >> to ICU and intubated  Consults:  PCCM  Procedures:  1/21 ETT >   Significant Diagnostic Tests:  CT angio chest 1/12 >> b/l GGO, atherosclerosis CT head 1/21 >> retropharyngeal gas, NAICP CT chest 1/21 >> small right sided pneumothorax, extensive pneumomediastinum, bilatearl infiltrates consistent with COVID 19  Micro Data:  SARS CoV2 Ag 1/11 >> Positive Sputum 1/21 >> yeast, GPC  Antimicrobials:  Decadron 1/11 >  Remdesivir 1/11 >> 1/15 Convalescent plasma 1/14 >> Rocephin 1/11 >> 1/15 Zithromax 1/11 >> 1/15   Interim history/subjective:   Grimace to pain Cough, gag today Localizes to pain again today  Objective   Blood pressure (!) 146/64, pulse 76, temperature 98.9 F (37.2 C), temperature source Axillary, resp. rate (!) 23, height 5\' 8"  (1.727 m), weight 82 kg, SpO2 96 %.    Vent Mode: PRVC FiO2 (%):  [40 %] 40 % Set Rate:  [22 bmp] 22 bmp Vt Set:  [480 mL] 480 mL PEEP:  [5 cmH20] 5 cmH20 Pressure Support:  [5 cmH20] 5 cmH20 Plateau Pressure:  [14 cmH20-19 cmH20] 14 cmH20   Intake/Output Summary (Last  24 hours) at 01/03/2020 0816 Last data filed at 01/03/2020 0600 Gross per 24 hour  Intake 1490.95 ml  Output 1100 ml  Net 390.95 ml   Filed Weights   12/13/2019 0956 12/30/2019 2100 01/03/20 0342  Weight: 77.1 kg 72.2 kg 82 kg    Examination:  General:  In bed on vent HENT: NCAT ETT in place PULM: CTA B, vent supported breathing CV: RRR, no mgr GI: BS+, soft, nontender MSK: normal bulk and tone Neuro: drowsy, no eye opening to touch or voice, but will localize to pain, has cough, gag, movement to pain is more robust today  1/23 CXR >> personally reviewed,no significant change in bilateral airspace disease in bases  Resolved Hospital Problem list     Assessment & Plan:  ARDS due to COVID 19 : this morning he is pasing an SBT, oxygenation favors extubation, mental status uncertain Continue pressure support as long as tolerted Pulmonary toilette, VAP prevention  Resp culture positive for GPC Monitor for fever  Need for sedation Hold all sedation  Pneumomediastinum Small R pneumothorax Daly CXR  Acute toxic metabolic encephalopathy  Stop sedation and assess today  Paroxyxsmal Atrial fib Tele Metoprolol eliquis  DM2 ssi + detemir accuchecks q4  Anemia of chronic disease Monitor for bleeding Transfuse PRBC for Hgb < 7 gm/dL  Cold feet 1/23, good cap refill Keep feet warm Monitor for signs of cyanosis   Best practice:  Diet: tube feeding, continue today Pain/Anxiety/Delirium protocol (if indicated): yes, RASS  target 0 to -1 VAP protocol (if indicated): yes DVT prophylaxis: Eliquis GI prophylaxis: famotidine Glucose control: SSI Mobility: bed rest Code Status: full Family Communication: I updated his wife Bethena Roys 1/23 on plan of care Disposition: remain in ICU  Labs   CBC: Recent Labs  Lab 12/28/19 0326 12/28/19 0326 12/29/19 0650 12/29/19 0650 12/30/19 0215 12/30/19 0215 12/31/19 0425 01/01/20 0615 01/01/20 0826 01/02/20 0500 01/03/20 0000    WBC 20.5*   < > 27.1*   < > 22.8*  --  21.1* 26.1*  --  24.8* 29.3*  NEUTROABS 18.4*  --  23.9*  --   --   --   --   --   --   --  26.6*  HGB 11.8*   < > 13.0   < > 12.0*   < > 12.0* 12.0* 10.5* 11.6* 11.9*  HCT 34.9*   < > 38.7*   < > 35.7*   < > 35.1* 35.9* 31.0* 35.1* 35.8*  MCV 86.4   < > 88.2   < > 86.9  --  87.5 88.4  --  90.7 89.3  PLT 412*   < > 525*   < > 404*  --  335 275  --  221 208   < > = values in this interval not displayed.    Basic Metabolic Panel: Recent Labs  Lab 12/29/19 0650 12/29/19 0650 12/30/19 0215 12/30/19 0215 12/31/19 0425 01/01/20 0615 01/01/20 0826 01/01/20 1651 01/02/20 0500 01/02/20 1656 01/03/20 0000  NA 138   < > 136   < > 135 134* 135  --  133*  --  135  K 4.3   < > 4.7   < > 4.4 4.8 4.3  --  4.8  --  4.6  CL 102   < > 104  --  103 99  --   --  101  --  102  CO2 24   < > 21*  --  21* 22  --   --  21*  --  21*  GLUCOSE 63*   < > 61*  --  63* 110*  --   --  160*  --  122*  BUN 41*   < > 44*  --  44* 48*  --   --  59*  --  75*  CREATININE 1.21   < > 1.26*  --  1.27* 1.21  --   --  1.37*  --  1.93*  CALCIUM 8.7*   < > 8.7*  --  8.5* 8.4*  --   --  7.9*  --  7.8*  MG 1.9  --   --   --   --  2.2  --  2.1 2.2 2.1  --   PHOS 2.9   < > 4.1  --   --  4.1  --  4.9* 4.1 3.1  --    < > = values in this interval not displayed.   GFR: Estimated Creatinine Clearance: 30.5 mL/min (A) (by C-G formula based on SCr of 1.93 mg/dL (H)). Recent Labs  Lab 12/27/19 0900 12/28/19 0326 12/31/19 0425 01/01/20 0615 01/02/20 0500 01/03/20 0000  PROCALCITON 1.45  --   --   --   --   --   WBC  --    < > 21.1* 26.1* 24.8* 29.3*   < > = values in this interval not displayed.    Liver Function Tests: Recent Labs  Lab 12/30/19 0215 12/31/19 0425 01/01/20  0615 01/02/20 0500 01/03/20 0000  AST 53* 36 29 25 22   ALT 79* 62* 49* 45* 40  ALKPHOS 192* 168* 139* 122 110  BILITOT 0.9 1.1 0.9 0.6 0.7  PROT 6.3* 6.3* 6.0* 5.8* 5.6*  ALBUMIN 2.9* 2.8* 2.6* 2.4*  2.2*   No results for input(s): LIPASE, AMYLASE in the last 168 hours. No results for input(s): AMMONIA in the last 168 hours.  ABG    Component Value Date/Time   PHART 7.363 01/01/2020 0826   PCO2ART 37.3 01/01/2020 0826   PO2ART 170.0 (H) 01/01/2020 0826   HCO3 21.2 01/01/2020 0826   TCO2 22 01/01/2020 0826   ACIDBASEDEF 4.0 (H) 01/01/2020 0826   O2SAT 99.0 01/01/2020 0826     Coagulation Profile: No results for input(s): INR, PROTIME in the last 168 hours.  Cardiac Enzymes: No results for input(s): CKTOTAL, CKMB, CKMBINDEX, TROPONINI in the last 168 hours.  HbA1C: Hgb A1c MFr Bld  Date/Time Value Ref Range Status  12/23/2019 11:05 AM 7.4 (H) 4.8 - 5.6 % Final    Comment:    (NOTE) Pre diabetes:          5.7%-6.4% Diabetes:              >6.4% Glycemic control for   <7.0% adults with diabetes   06/11/2019 08:56 AM 6.4 (H) 4.8 - 5.6 % Final    Comment:    (NOTE) Pre diabetes:          5.7%-6.4% Diabetes:              >6.4% Glycemic control for   <7.0% adults with diabetes     CBG: Recent Labs  Lab 01/02/20 1533 01/02/20 1958 01/03/20 0021 01/03/20 0320 01/03/20 0737  GLUCAP 261* 229* 136* 114* 104*       Critical care time: 35 minutes       Roselie Awkward, MD [facility] Pager: [phone] Cell: [phone] If no response, call [phone]

## 2020-01-03 NOTE — Progress Notes (Signed)
Updated wife Bethena Roys this evening. All questions and concerns answered. Provided reassurance and support.

## 2020-01-03 NOTE — Progress Notes (Signed)
Assisted family with video/camera time via elink 

## 2020-01-03 NOTE — Plan of Care (Signed)
Discussed plan of care in front of patient and on the phone with wife and family (E-link video @2050  and wife @2139 ) plan of care for the evening, pain management and equipment with some teach back displayed by the Family.  Told later that mom likes [employer].

## 2020-01-03 NOTE — Plan of Care (Signed)
Pt still on the ventilator. RASS -4. Pt grimaces to pain. Does not follow commands at this time. Wife was updated this morning and all questions were answered.   Problem: Clinical Measurements: Goal: Will remain free from infection Outcome: Progressing Goal: Diagnostic test results will improve Outcome: Progressing Goal: Respiratory complications will improve Outcome: Progressing Goal: Cardiovascular complication will be avoided Outcome: Progressing   Problem: Coping: Goal: Level of anxiety will decrease Outcome: Progressing   Problem: Pain Managment: Goal: General experience of comfort will improve Outcome: Progressing   Problem: Safety: Goal: Ability to remain free from injury will improve Outcome: Progressing   Problem: Skin Integrity: Goal: Risk for impaired skin integrity will decrease Outcome: Progressing   Problem: Respiratory: Goal: Will maintain a patent airway Outcome: Progressing Goal: Complications related to the disease process, condition or treatment will be avoided or minimized Outcome: Progressing   Problem: Education: Goal: Knowledge of General Education information will improve Description: Including pain rating scale, medication(s)/side effects and non-pharmacologic comfort measures Outcome: Not Progressing   Problem: Health Behavior/Discharge Planning: Goal: Ability to manage health-related needs will improve Outcome: Not Progressing   Problem: Clinical Measurements: Goal: Ability to maintain clinical measurements within normal limits will improve Outcome: Not Progressing   Problem: Activity: Goal: Risk for activity intolerance will decrease Outcome: Not Progressing   Problem: Nutrition: Goal: Adequate nutrition will be maintained Outcome: Not Progressing   Problem: Elimination: Goal: Will not experience complications related to bowel motility Outcome: Not Progressing Goal: Will not experience complications related to urinary  retention Outcome: Not Progressing   Problem: Education: Goal: Knowledge of risk factors and measures for prevention of condition will improve Outcome: Not Progressing   Problem: Coping: Goal: Psychosocial and spiritual needs will be supported Outcome: Not Progressing

## 2020-01-04 ENCOUNTER — Inpatient Hospital Stay (HOSPITAL_COMMUNITY): Payer: PPO

## 2020-01-04 DIAGNOSIS — I10 Essential (primary) hypertension: Secondary | ICD-10-CM

## 2020-01-04 LAB — CBC WITH DIFFERENTIAL/PLATELET
Abs Immature Granulocytes: 0.28 10*3/uL — ABNORMAL HIGH (ref 0.00–0.07)
Basophils Absolute: 0 10*3/uL (ref 0.0–0.1)
Basophils Relative: 0 %
Eosinophils Absolute: 0 10*3/uL (ref 0.0–0.5)
Eosinophils Relative: 0 %
HCT: 33.9 % — ABNORMAL LOW (ref 39.0–52.0)
Hemoglobin: 11.4 g/dL — ABNORMAL LOW (ref 13.0–17.0)
Immature Granulocytes: 1 %
Lymphocytes Relative: 3 %
Lymphs Abs: 0.7 10*3/uL (ref 0.7–4.0)
MCH: 30.2 pg (ref 26.0–34.0)
MCHC: 33.6 g/dL (ref 30.0–36.0)
MCV: 89.7 fL (ref 80.0–100.0)
Monocytes Absolute: 1.3 10*3/uL — ABNORMAL HIGH (ref 0.1–1.0)
Monocytes Relative: 5 %
Neutro Abs: 26.5 10*3/uL — ABNORMAL HIGH (ref 1.7–7.7)
Neutrophils Relative %: 91 %
Platelets: 161 10*3/uL (ref 150–400)
RBC: 3.78 MIL/uL — ABNORMAL LOW (ref 4.22–5.81)
RDW: 15 % (ref 11.5–15.5)
WBC: 28.9 10*3/uL — ABNORMAL HIGH (ref 4.0–10.5)
nRBC: 0 % (ref 0.0–0.2)

## 2020-01-04 LAB — GLUCOSE, CAPILLARY
Glucose-Capillary: 275 mg/dL — ABNORMAL HIGH (ref 70–99)
Glucose-Capillary: 184 mg/dL — ABNORMAL HIGH (ref 70–99)
Glucose-Capillary: 216 mg/dL — ABNORMAL HIGH (ref 70–99)
Glucose-Capillary: 301 mg/dL — ABNORMAL HIGH (ref 70–99)
Glucose-Capillary: 242 mg/dL — ABNORMAL HIGH (ref 70–99)
Glucose-Capillary: 144 mg/dL — ABNORMAL HIGH (ref 70–99)

## 2020-01-04 LAB — APTT: aPTT: 96 seconds — ABNORMAL HIGH (ref 24–36)

## 2020-01-04 LAB — COMPREHENSIVE METABOLIC PANEL
ALT: 41 U/L (ref 0–44)
AST: 25 U/L (ref 15–41)
Albumin: 1.9 g/dL — ABNORMAL LOW (ref 3.5–5.0)
Alkaline Phosphatase: 98 U/L (ref 38–126)
Anion gap: 11 (ref 5–15)
BUN: 86 mg/dL — ABNORMAL HIGH (ref 8–23)
CO2: 18 mmol/L — ABNORMAL LOW (ref 22–32)
Calcium: 7.5 mg/dL — ABNORMAL LOW (ref 8.9–10.3)
Chloride: 107 mmol/L (ref 98–111)
Creatinine, Ser: 2 mg/dL — ABNORMAL HIGH (ref 0.61–1.24)
GFR calc Af Amer: 36 mL/min — ABNORMAL LOW (ref >60)
GFR calc non Af Amer: 31 mL/min — ABNORMAL LOW (ref >60)
Glucose, Bld: 196 mg/dL — ABNORMAL HIGH (ref 70–99)
Potassium: 4.6 mmol/L (ref 3.5–5.1)
Sodium: 136 mmol/L (ref 135–145)
Total Bilirubin: 0.6 mg/dL (ref 0.3–1.2)
Total Protein: 5 g/dL — ABNORMAL LOW (ref 6.5–8.1)

## 2020-01-04 LAB — TRIGLYCERIDES: Triglycerides: 101 mg/dL (ref <150)

## 2020-01-04 MED ORDER — LEVOFLOXACIN IN D5W 750 MG/150ML IV SOLN
750.0000 mg | INTRAVENOUS | Status: DC
  Administered 2020-01-04 – 2020-01-06 (×2): 750 mg via INTRAVENOUS
  Filled 2020-01-04 (×2): qty 150

## 2020-01-04 MED ORDER — CHLORHEXIDINE GLUCONATE 0.12 % MT SOLN
15.0000 mL | Freq: Two times a day (BID) | OROMUCOSAL | Status: DC
  Administered 2020-01-04 – 2020-01-07 (×6): 15 mL via OROMUCOSAL
  Filled 2020-01-04 (×4): qty 15

## 2020-01-04 MED ORDER — HEPARIN (PORCINE) 25000 UT/250ML-% IV SOLN
1200.0000 [IU]/h | INTRAVENOUS | Status: DC
  Administered 2020-01-04 – 2020-01-05 (×2): 1200 [IU]/h via INTRAVENOUS
  Filled 2020-01-04 (×2): qty 250

## 2020-01-04 MED ORDER — LACTATED RINGERS IV BOLUS
1000.0000 mL | Freq: Once | INTRAVENOUS | Status: AC
  Administered 2020-01-04: 1000 mL via INTRAVENOUS

## 2020-01-04 MED ORDER — VANCOMYCIN HCL IN DEXTROSE 1-5 GM/200ML-% IV SOLN
1000.0000 mg | INTRAVENOUS | Status: DC
  Administered 2020-01-05: 1000 mg via INTRAVENOUS
  Filled 2020-01-04 (×2): qty 200

## 2020-01-04 MED ORDER — VANCOMYCIN HCL 1750 MG/350ML IV SOLN
1750.0000 mg | Freq: Once | INTRAVENOUS | Status: AC
  Administered 2020-01-04: 1750 mg via INTRAVENOUS
  Filled 2020-01-04: qty 350

## 2020-01-04 NOTE — Progress Notes (Signed)
NAME:  Terrence Cisneros, MRN:  [id number], DOB:  [birth year], LOS: 13 ADMISSION DATE:  12/25/2019, CONSULTATION DATE:  1/22 REFERRING MD:  Vanita Ingles, CHIEF COMPLAINT:  dyspnea   Brief History   79 yo male presented with nausea, dyspnea, cough.  Didn't improve with outpt antibiotic and prednisone.  Found to have hypoxia from COVID 19 pneumonia.  Reports having exposure to COVID during Christmas.  Noted to have elevated procalcitonin also and treated with ABx for CAP.  Had transient A fib with RVR.  Developed worsening hypoxia with altered mental status 1/21 and transferred to ICU.  Past Medical History  Hypothyroidism, OA, CKD 2, Allergies, HTN, DM, Neuropathy  Significant Hospital Events   1/11 Admit 1/13 A fib with RVR, add amiodarone 1/17 d/c amiodarone and start lopressor 1/20 transition from lovenox to eliquis 1/21 worsening hypoxia with altered mental status >> to ICU and intubated  Consults:  PCCM  Procedures:  1/21 ETT >   Significant Diagnostic Tests:  CT angio chest 1/12 >> b/l GGO, atherosclerosis CT head 1/21 >> retropharyngeal gas, NAICP CT chest 1/21 >> small right sided pneumothorax, extensive pneumomediastinum, bilatearl infiltrates consistent with COVID 19  Micro Data:  SARS CoV2 Ag 1/11 >> Positive Sputum 1/21 >> yeast, GPC  Antimicrobials:  Decadron 1/11 >  Remdesivir 1/11 >> 1/15 Convalescent plasma 1/14 >> Rocephin 1/11 >> 1/15 Zithromax 1/11 >> 1/15    1/24 vanc > 1/24 levaquin > Interim history/subjective:   Exam unchanged Increased secretions  Objective   Blood pressure (!) 137/54, pulse (!) 105, temperature 99.3 F (37.4 C), temperature source Oral, resp. rate (!) 31, height 5\' 8"  (1.727 m), weight 82.9 kg, SpO2 93 %.    Vent Mode: PRVC FiO2 (%):  [40 %] 40 % Set Rate:  [22 bmp] 22 bmp Vt Set:  [480 mL] 480 mL PEEP:  [5 cmH20] 5 cmH20 Pressure Support:  [5 cmH20] 5 cmH20 Plateau Pressure:  [18 cmH20-22 cmH20] 18 cmH20   Intake/Output Summary  (Last 24 hours) at 01/04/2020 0851 Last data filed at 01/04/2020 0700 Gross per 24 hour  Intake 1810.77 ml  Output 1025 ml  Net 785.77 ml   Filed Weights   12/15/2019 2100 01/03/20 0342 01/04/20 0331  Weight: 72.2 kg 82 kg 82.9 kg    Examination:  General:  In bed on vent HENT: NCAT ETT in place PULM: CTA B, vent supported breathing CV: RRR, no mgr GI: BS+, soft, nontender MSK: normal bulk and tone Neuro:on vent, withdraws to pain, grimace to pain   1/23 CXR >> personally reviewed,no significant change in bilateral airspace disease in bases  Resolved Hospital Problem list     Assessment & Plan:  ARDS due to COVID 19 : this morning he is pasing an SBT, oxygenation favors extubation, mental status uncertain Pressure support as long as tolerated Fentanyl prn for dyspnea VAP prevention  Stenotrophamonas HCAP Start levaquin Start vanc for coag neg staph  Need for sedation Hold sedation Fentanyl prn dyspnea  AKI Bolus LR now Monitor BMET and UOP Replace electrolytes as needed  Pneumomediastinum Small R pneumothorax CXR in AM  Acute toxic metabolic encephalopathy Worrisome for anoxic injury given hypoxemia on 1/21 AM Discussed with neuro Hold sedation Check EEG today MRI at 7 days (~1/28)  Paroxyxsmal Atrial fib Tele metprolol eliquis  DM2 SSI + Detemir accucheck  Anemia of chronic disease Monitor for bleeding Transfuse PRBC for Hgb < 7 gm/dL  Cold feet 1/23, good cap refill Keep feet warm Monitor  for cyanosis   Best practice:  Diet: tube feeding, continue today Pain/Anxiety/Delirium protocol (if indicated): yes, RASS target 0 to -1 VAP protocol (if indicated): yes DVT prophylaxis: Eliquis GI prophylaxis: famotidine Glucose control: SSI Mobility: bed rest Code Status: full Family Communication: Bethena Roys (wife) updated on 1/24 with diagnosis and plan of care Disposition: remain in ICU  Labs   CBC: Recent Labs  Lab 12/29/19 0650  12/29/19 0650 12/30/19 0215 12/30/19 0215 12/31/19 0425 01/01/20 0615 01/01/20 0826 01/02/20 0500 01/03/20 0000  WBC 27.1*   < > 22.8*  --  21.1* 26.1*  --  24.8* 29.3*  NEUTROABS 23.9*  --   --   --   --   --   --   --  26.6*  HGB 13.0   < > 12.0*   < > 12.0* 12.0* 10.5* 11.6* 11.9*  HCT 38.7*   < > 35.7*   < > 35.1* 35.9* 31.0* 35.1* 35.8*  MCV 88.2   < > 86.9  --  87.5 88.4  --  90.7 89.3  PLT 525*   < > 404*  --  335 275  --  221 208   < > = values in this interval not displayed.    Basic Metabolic Panel: Recent Labs  Lab 12/29/19 0650 12/29/19 0650 12/30/19 0215 12/30/19 0215 12/31/19 0425 01/01/20 0615 01/01/20 0826 01/01/20 1651 01/02/20 0500 01/02/20 1656 01/03/20 0000  NA 138   < > 136   < > 135 134* 135  --  133*  --  135  K 4.3   < > 4.7   < > 4.4 4.8 4.3  --  4.8  --  4.6  CL 102   < > 104  --  103 99  --   --  101  --  102  CO2 24   < > 21*  --  21* 22  --   --  21*  --  21*  GLUCOSE 63*   < > 61*  --  63* 110*  --   --  160*  --  122*  BUN 41*   < > 44*  --  44* 48*  --   --  59*  --  75*  CREATININE 1.21   < > 1.26*  --  1.27* 1.21  --   --  1.37*  --  1.93*  CALCIUM 8.7*   < > 8.7*  --  8.5* 8.4*  --   --  7.9*  --  7.8*  MG 1.9  --   --   --   --  2.2  --  2.1 2.2 2.1  --   PHOS 2.9   < > 4.1  --   --  4.1  --  4.9* 4.1 3.1  --    < > = values in this interval not displayed.   GFR: Estimated Creatinine Clearance: 33.1 mL/min (A) (by C-G formula based on SCr of 1.93 mg/dL (H)). Recent Labs  Lab 12/31/19 0425 01/01/20 0615 01/02/20 0500 01/03/20 0000  WBC 21.1* 26.1* 24.8* 29.3*    Liver Function Tests: Recent Labs  Lab 12/30/19 0215 12/31/19 0425 01/01/20 0615 01/02/20 0500 01/03/20 0000  AST 53* 36 29 25 22   ALT 79* 62* 49* 45* 40  ALKPHOS 192* 168* 139* 122 110  BILITOT 0.9 1.1 0.9 0.6 0.7  PROT 6.3* 6.3* 6.0* 5.8* 5.6*  ALBUMIN 2.9* 2.8* 2.6* 2.4* 2.2*   No results for input(s): LIPASE, AMYLASE  in the last 168 hours. No results  for input(s): AMMONIA in the last 168 hours.  ABG    Component Value Date/Time   PHART 7.363 01/01/2020 0826   PCO2ART 37.3 01/01/2020 0826   PO2ART 170.0 (H) 01/01/2020 0826   HCO3 21.2 01/01/2020 0826   TCO2 22 01/01/2020 0826   ACIDBASEDEF 4.0 (H) 01/01/2020 0826   O2SAT 99.0 01/01/2020 0826     Coagulation Profile: No results for input(s): INR, PROTIME in the last 168 hours.  Cardiac Enzymes: No results for input(s): CKTOTAL, CKMB, CKMBINDEX, TROPONINI in the last 168 hours.  HbA1C: Hgb A1c MFr Bld  Date/Time Value Ref Range Status  12/23/2019 11:05 AM 7.4 (H) 4.8 - 5.6 % Final    Comment:    (NOTE) Pre diabetes:          5.7%-6.4% Diabetes:              >6.4% Glycemic control for   <7.0% adults with diabetes   06/11/2019 08:56 AM 6.4 (H) 4.8 - 5.6 % Final    Comment:    (NOTE) Pre diabetes:          5.7%-6.4% Diabetes:              >6.4% Glycemic control for   <7.0% adults with diabetes     CBG: Recent Labs  Lab 01/03/20 1622 01/03/20 2046 01/03/20 2328 01/04/20 0327 01/04/20 0736  GLUCAP 244* 242* 271* 275* 184*       Critical care time: 32 minutes       Roselie Awkward, MD [facility] Pager: [phone] Cell: [phone] If no response, call [phone]

## 2020-01-04 NOTE — Progress Notes (Addendum)
ANTICOAGULATION & ANTIBIOTIC CONSULT NOTE - Initial Consult  Pharmacy Consult for heparin; Levaquin + vancomycin Indication: atrial fibrillation; stenotrophomonas + staph epi pneumonia  Allergies  Allergen Reactions  . Lisinopril Other (See Comments)    BP dropped very low    Patient Measurements: Height: 5\' 8"  (172.7 cm) Weight: 182 lb 12.2 oz (82.9 kg) IBW/kg (Calculated) : 68.4 Heparin Dosing Weight: 82  Vital Signs: Temp: 99.2 F (37.3 C) (01/24 1600) Temp Source: Axillary (01/24 1600) BP: 135/66 (01/24 1700) Pulse Rate: 97 (01/24 1700)  Labs: Recent Labs    01/02/20 0500 01/02/20 0500 01/03/20 0000 01/04/20 0856 01/04/20 1723  HGB 11.6*   < > 11.9* 11.4*  --   HCT 35.1*  --  35.8* 33.9*  --   PLT 221  --  208 161  --   APTT  --   --   --   --  96*  CREATININE 1.37*  --  1.93* 2.00*  --    < > = values in this interval not displayed.    Estimated Creatinine Clearance: 31.9 mL/min (A) (by C-G formula based on SCr of 2 mg/dL (H)).   Medical History: Past Medical History:  Diagnosis Date  . Acquired hypothyroidism   . Arthritis   . Cervical radiculopathy    "fingers get cold and my neck pops when i turn it dide to side, theyre going to send me for a nerve induction next month"  . Chronic kidney disease    "they told me a couple years ago  to avoid taking ibuprofen because my kidneys werent 100%"  . Environmental allergies   . Hypertension   . Neuropathy    "ive got numbness in my left foot "   . Type 2 diabetes mellitus (HCC)     Medications:  Medications Prior to Admission  Medication Sig Dispense Refill Last Dose  . acetaminophen (TYLENOL) 650 MG CR tablet Take 1,300 mg by mouth every 8 (eight) hours as needed for pain.   unknown at prn  . Ascorbic Acid (VITA-C PO) Take 1 tablet by mouth daily.   12/21/2019 at Unknown time  . aspirin EC 81 MG tablet Take 81 mg by mouth every other day.   12/20/2019  . losartan-hydrochlorothiazide (HYZAAR) 100-25 MG  tablet Take 0.5 tablets by mouth daily.    12/21/2019 at Unknown time  . metFORMIN (GLUCOPHAGE) 500 MG tablet Take 500 mg by mouth 2 (two) times daily with a meal.    12/21/2019 at Unknown time  . Multiple Vitamins-Minerals (ZINC PO) Take 1 tablet by mouth daily.   12/21/2019 at Unknown time    Assessment: 46 YOM with COVID-19 pneumonia on aipxaban for new AFib.   AC: Of note, patient's renal fx is worsening and will now switch patient to IV heparin. Last dose of apixaban was last night. H/H and Plt low stable  ID: Endotracheal culture now growing staphylococcus epidermis and stenotrophomonas maltophilia. Pharmacy consulted to start IV Levaquin and vancomycin. WBC is elevated. SCr up to 2  PTT came back therapeutic tonight at 96. We will continue with the same rate and confirm with another PTT in AM along with HL.   Goal of Therapy:  Heparin level 0.3-0.7 units/ml aPTT 66-102 seconds Monitor platelets by anticoagulation protocol: Yes   Plan:  -Cont IV heparin at 1200 units/hr -MOnitor daily HL, aPTT, CBC and s/s of bleeding   Onnie Boer, PharmD, BCIDP, AAHIVP, CPP Infectious Disease Pharmacist 01/04/2020 7:01 PM

## 2020-01-04 NOTE — Progress Notes (Signed)
ANTICOAGULATION & ANTIBIOTIC CONSULT NOTE - Initial Consult  Pharmacy Consult for heparin; Levaquin + vancomycin Indication: atrial fibrillation; stenotrophomonas + staph epi pneumonia  Allergies  Allergen Reactions  . Lisinopril Other (See Comments)    BP dropped very low    Patient Measurements: Height: 5\' 8"  (172.7 cm) Weight: 182 lb 12.2 oz (82.9 kg) IBW/kg (Calculated) : 68.4 Heparin Dosing Weight: 82  Vital Signs: Temp: 99.3 F (37.4 C) (01/24 0700) Temp Source: Oral (01/24 0700) BP: 137/54 (01/24 0800) Pulse Rate: 105 (01/24 0800)  Labs: Recent Labs    01/02/20 0500 01/03/20 0000  HGB 11.6* 11.9*  HCT 35.1* 35.8*  PLT 221 208  CREATININE 1.37* 1.93*    Estimated Creatinine Clearance: 33.1 mL/min (A) (by C-G formula based on SCr of 1.93 mg/dL (H)).   Medical History: Past Medical History:  Diagnosis Date  . Acquired hypothyroidism   . Arthritis   . Cervical radiculopathy    "fingers get cold and my neck pops when i turn it dide to side, theyre going to send me for a nerve induction next month"  . Chronic kidney disease    "they told me a couple years ago  to avoid taking ibuprofen because my kidneys werent 100%"  . Environmental allergies   . Hypertension   . Neuropathy    "ive got numbness in my left foot "   . Type 2 diabetes mellitus (HCC)     Medications:  Medications Prior to Admission  Medication Sig Dispense Refill Last Dose  . acetaminophen (TYLENOL) 650 MG CR tablet Take 1,300 mg by mouth every 8 (eight) hours as needed for pain.   unknown at prn  . Ascorbic Acid (VITA-C PO) Take 1 tablet by mouth daily.   12/21/2019 at Unknown time  . aspirin EC 81 MG tablet Take 81 mg by mouth every other day.   12/20/2019  . losartan-hydrochlorothiazide (HYZAAR) 100-25 MG tablet Take 0.5 tablets by mouth daily.    12/21/2019 at Unknown time  . metFORMIN (GLUCOPHAGE) 500 MG tablet Take 500 mg by mouth 2 (two) times daily with a meal.    12/21/2019 at Unknown  time  . Multiple Vitamins-Minerals (ZINC PO) Take 1 tablet by mouth daily.   12/21/2019 at Unknown time    Assessment: 13 YOM with COVID-19 pneumonia on aipxaban for new AFib.   AC: Of note, patient's renal fx is worsening and will now switch patient to IV heparin. Last dose of apixaban was last night. H/H and Plt low stable  ID: Endotracheal culture now growing staphylococcus epidermis and stenotrophomonas maltophilia. Pharmacy consulted to start IV Levaquin and vancomycin. WBC is elevated. SCr up to 2  Goal of Therapy:  Heparin level 0.3-0.7 units/ml aPTT 66-102 seconds Monitor platelets by anticoagulation protocol: Yes   Plan:  -Start IV heparin at 1200 units/hr. No bolus -F/u 8 hr aPTT -MOnitor daily HL, aPTT, CBC and s/s of bleeding   -Levaquin 750 mg IV Q 48 hours -Vancomycin 1750 mg IV load, then start Vancomycin 1000 mg IV Q 24 hrs. Goal AUC 400-550. Expected AUC: 491 SCr used: 2 -F/u susceptibilities and clinical progress -Monitor renal fx and obtain vanc levels as indicated   Albertina Parr, PharmD., BCPS Clinical Pharmacist Clinical phone for 01/04/20 until 5pm: [phone]

## 2020-01-04 NOTE — Plan of Care (Signed)
Discussed in front of patient with family on video conference plan of care for the evening, pain management, heart rate and new antibiotics with  some teach back displayed and all questions anwsered at this time.

## 2020-01-05 ENCOUNTER — Inpatient Hospital Stay (HOSPITAL_COMMUNITY): Payer: PPO

## 2020-01-05 ENCOUNTER — Inpatient Hospital Stay (HOSPITAL_COMMUNITY)
Admit: 2020-01-05 | Discharge: 2020-01-05 | Disposition: A | Discharge status: Home / Self Care | Payer: PPO | Attending: Pulmonary Disease | Admitting: Pulmonary Disease

## 2020-01-05 DIAGNOSIS — R4182 Altered mental status, unspecified: Secondary | ICD-10-CM

## 2020-01-05 DIAGNOSIS — J9601 Acute respiratory failure with hypoxia: Secondary | ICD-10-CM

## 2020-01-05 LAB — GLUCOSE, CAPILLARY
Glucose-Capillary: 136 mg/dL — ABNORMAL HIGH (ref 70–99)
Glucose-Capillary: 150 mg/dL — ABNORMAL HIGH (ref 70–99)
Glucose-Capillary: 168 mg/dL — ABNORMAL HIGH (ref 70–99)
Glucose-Capillary: 197 mg/dL — ABNORMAL HIGH (ref 70–99)
Glucose-Capillary: 199 mg/dL — ABNORMAL HIGH (ref 70–99)
Glucose-Capillary: 159 mg/dL — ABNORMAL HIGH (ref 70–99)

## 2020-01-05 LAB — APTT
aPTT: 199 seconds (ref 24–36)
aPTT: 152 seconds — ABNORMAL HIGH (ref 24–36)
aPTT: 97 seconds — ABNORMAL HIGH (ref 24–36)

## 2020-01-05 LAB — CBC
HCT: 31.2 % — ABNORMAL LOW (ref 39.0–52.0)
Hemoglobin: 10.5 g/dL — ABNORMAL LOW (ref 13.0–17.0)
MCH: 30.5 pg (ref 26.0–34.0)
MCHC: 33.7 g/dL (ref 30.0–36.0)
MCV: 90.7 fL (ref 80.0–100.0)
Platelets: 138 10*3/uL — ABNORMAL LOW (ref 150–400)
RBC: 3.44 MIL/uL — ABNORMAL LOW (ref 4.22–5.81)
RDW: 14.8 % (ref 11.5–15.5)
WBC: 15.6 10*3/uL — ABNORMAL HIGH (ref 4.0–10.5)
nRBC: 0 % (ref 0.0–0.2)

## 2020-01-05 LAB — COMPREHENSIVE METABOLIC PANEL
ALT: 55 U/L — ABNORMAL HIGH (ref 0–44)
AST: 28 U/L (ref 15–41)
Albumin: 1.8 g/dL — ABNORMAL LOW (ref 3.5–5.0)
Alkaline Phosphatase: 86 U/L (ref 38–126)
Anion gap: 11 (ref 5–15)
BUN: 93 mg/dL — ABNORMAL HIGH (ref 8–23)
CO2: 18 mmol/L — ABNORMAL LOW (ref 22–32)
Calcium: 7.5 mg/dL — ABNORMAL LOW (ref 8.9–10.3)
Chloride: 110 mmol/L (ref 98–111)
Creatinine, Ser: 2.32 mg/dL — ABNORMAL HIGH (ref 0.61–1.24)
GFR calc Af Amer: 30 mL/min — ABNORMAL LOW (ref >60)
GFR calc non Af Amer: 26 mL/min — ABNORMAL LOW (ref >60)
Glucose, Bld: 149 mg/dL — ABNORMAL HIGH (ref 70–99)
Potassium: 4.8 mmol/L (ref 3.5–5.1)
Sodium: 139 mmol/L (ref 135–145)
Total Bilirubin: 0.7 mg/dL (ref 0.3–1.2)
Total Protein: 5.2 g/dL — ABNORMAL LOW (ref 6.5–8.1)

## 2020-01-05 LAB — TRIGLYCERIDES: Triglycerides: 58 mg/dL (ref <150)

## 2020-01-05 LAB — CREATININE, URINE, RANDOM: Creatinine, Urine: 54.89 mg/dL

## 2020-01-05 LAB — HEPARIN LEVEL (UNFRACTIONATED): Heparin Unfractionated: >2.2 IU/mL — ABNORMAL HIGH (ref 0.30–0.70)

## 2020-01-05 LAB — SODIUM, URINE, RANDOM: Sodium, Ur: 34 mmol/L

## 2020-01-05 MED ORDER — FAMOTIDINE 40 MG/5ML PO SUSR
20.0000 mg | Freq: Every day | ORAL | Status: DC
  Administered 2020-01-06 – 2020-01-07 (×2): 20 mg
  Filled 2020-01-05 (×2): qty 2.5

## 2020-01-05 MED ORDER — HEPARIN (PORCINE) 25000 UT/250ML-% IV SOLN
900.0000 [IU]/h | INTRAVENOUS | Status: DC
  Administered 2020-01-06: 900 [IU]/h via INTRAVENOUS
  Filled 2020-01-05: qty 250

## 2020-01-05 NOTE — Progress Notes (Signed)
Assisted family with camera/video time via elink 

## 2020-01-05 NOTE — Progress Notes (Signed)
ANTICOAGULATION CONSULT NOTE - Follow Up Consult  Pharmacy Consult for Heparin Indication: atrial fibrillation  Assessment: 30 YOM admitted 1/11 with COVID-19 pneumonia started on Lovenox (1/13) then apixaban (1/19) for new AFib.  Due to patient's renal fx worsening, pharmacy is now consulted to switch patient to IV heparin. Last dose of apixaban was 1/23 at 2131.    APTT 97 sec.  Still some blood in stools and urine, mouth bleeding as well.  Will try to keep lower end of therapeutic  Goal of Therapy:  Heparin level 0.3-0.7 units/ml aPTT 66-102 seconds Monitor platelets by anticoagulation protocol: Yes   Plan:  Reduce heparin IV infusion to 900 units/hr Daily aPTT, heparin level, and CBC Continue to monitor H&H and platelets  Thanks for allowing pharmacy to be a part of this patient's care.  Terrence Cisneros, PharmD Clinical Pharmacist  01/05/2020 9:56 PM

## 2020-01-05 NOTE — Progress Notes (Signed)
EEG completed, results pending. 

## 2020-01-05 NOTE — Progress Notes (Addendum)
NAME:  Terrence Cisneros, MRN:  [id number], DOB:  [birth year], LOS: 14 ADMISSION DATE:  12/19/2019, CONSULTATION DATE:  1/22 REFERRING MD:  Vanita Ingles, CHIEF COMPLAINT:  dyspnea   Brief History   79 y/o male presented with nausea, dyspnea, cough.  Didn't improve with outpt antibiotic and prednisone. Found to have hypoxia from COVID 19 pneumonia.  Reports having exposure to COVID during Christmas. Noted to have elevated procalcitonin also and treated with ABX for CAP.  Had transient A fib with RVR. Developed worsening hypoxia with altered mental status 1/21 and transferred to ICU.  Past Medical History  Hypothyroidism, OA, CKD 2, Allergies, HTN, DM, Neuropathy  Significant Hospital Events   1/11 Admit 1/13 A fib with RVR, add amiodarone 1/17 d/c amiodarone and start lopressor 1/20 transition from lovenox to eliquis 1/21 worsening hypoxia with altered mental status >> to ICU and intubated  Consults:  PCCM  Procedures:  1/21 ETT >>   Significant Diagnostic Tests:  CT angio chest 1/12 >> b/l GGO, atherosclerosis CT head 1/21 >> retropharyngeal gas, NAICP CT chest 1/21 >> small right sided pneumothorax, extensive pneumomediastinum, bilatearl infiltrates consistent with COVID 19 EEG 1/25 >>  Micro Data:  SARS CoV2 Ag 1/11 >> Positive Sputum 1/21 >> yeast, stenotrophomonas maltophilia, staph epidermis >>  Antimicrobials:  Decadron 1/11 > 1/24 Remdesivir 1/11 >> 1/15 Convalescent plasma 1/14 >> Rocephin 1/11 >> 1/15 Zithromax 1/11 >> 1/15    1/24 vanc > 1/24 levaquin >  Interim history/subjective:  Tmax 98.9 PEEP 5, FiO2 40%  Glucose range - 132 - 242 I/O - 1.1L UOP, +2L in 24h RT reports thick secretions from ETT.   EEG in progress RN reports TF on hold since yesterday with concern for thick secretions. Minor oral bloody secretions.  Objective   Blood pressure 114/63, pulse 76, temperature 98.9 F (37.2 C), resp. rate (!) 22, height 5\' 8"  (1.727 m), weight 67.6 kg, SpO2 95 %.    Vent Mode: PRVC FiO2 (%):  [40 %] 40 % Set Rate:  [22 bmp] 22 bmp Vt Set:  [480 mL] 480 mL PEEP:  [5 cmH20] 5 cmH20 Pressure Support:  [8 cmH20] 8 cmH20 Plateau Pressure:  [16 cmH20-24 cmH20] 18 cmH20   Intake/Output Summary (Last 24 hours) at 01/05/2020 0751 Last data filed at 01/05/2020 0700 Gross per 24 hour  Intake 3155.84 ml  Output 1125 ml  Net 2030.84 ml   Filed Weights   01/03/20 0342 01/04/20 0331 01/05/20 0500  Weight: 82 kg 69.8 kg 67.6 kg    Examination: General: elderly adult male lying in bed in NAD on vent, critically ill appearing   HEENT: MM pink/moist, ETT, pupils 35mm  Neuro: unresponsive, no response to painful stimuli, + cough, weaning on PSV CV: s1s2 RRR, SB-SR on monitor, no m/r/g PULM:  Non-labored on PSV, lungs bilaterally diminished but clear  GI: soft, bsx4 active  Extremities: cool/dry, no edema, doppler + pulses in LE  Skin: no rashes or lesions   1/25 CXR >> images personally reviewed, ETT in good position, SQ emphysema within both supraclavicular areas unchanged, bibasilar opacities unchanged  Resolved Hospital Problem list     Assessment & Plan:   ARDS secondary to COVID 19 -PSV wean as tolerated during day -minimize sedation  -VAP prevention measures -monitor SQ air, if clinical change > assess for pneumothorax   Stenotrophomonas, Staph Epidermis HCAP -continue vanco, levaquin  -await final culture sensitivities  Pneumomediastinum Small R pneumothorax -follow intermittent CXR -monitor clinically for now  Need for sedation -PRN fentanyl for pain -Minimize sedation as able   AKI Hx of Renal Stones -no indication for acute HD -assess FENa, Renal US  -LR at 30 ml/hr -renal dose medications -Trend BMP / urinary output -Replace electrolytes as indicated -Avoid nephrotoxic agents, ensure adequate renal perfusion  Acute toxic metabolic encephalopathy Worrisome for anoxic injury given hypoxemia on 1/21 AM. Previously  discussed with Neuro. Additional factors include AKI / possible poor renal clearance of drugs.  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 -tele monitoring  -continue metoprolol, heparin gtt   DM2 -SSI, moderate scale  -Levemir 7 units QD   Anemia of chronic disease Mild Thrombocytopenia -monitor for bleeding  -transfuse for Hgb <7% -heparin adjustment per pharmacy  -continue ASA for now, may need to hold with concern for bloody secretions  Cold feet 1/23, good cap refill, + doppler pulses  -warming measures -follow clinical exam    Best practice:  Diet: tube feeding  Pain/Anxiety/Delirium protocol (if indicated): yes, RASS target 0 to -1 VAP protocol (if indicated): yes DVT prophylaxis: Eliquis GI prophylaxis: famotidine Glucose control: SSI Mobility: bed rest Code Status: full Family Communication: Bethena Roys (wife) updated via phone 1/25 Disposition: ICU  Labs   CBC: Recent Labs  Lab 12/31/19 0425 12/31/19 0425 01/01/20 0615 01/01/20 0826 01/02/20 0500 01/03/20 0000 01/04/20 0856  WBC 21.1*  --  26.1*  --  24.8* 29.3* 28.9*  NEUTROABS  --   --   --   --   --  26.6* 26.5*  HGB 12.0*   < > 12.0* 10.5* 11.6* 11.9* 11.4*  HCT 35.1*   < > 35.9* 31.0* 35.1* 35.8* 33.9*  MCV 87.5  --  88.4  --  90.7 89.3 89.7  PLT 335  --  275  --  221 208 161   < > = values in this interval not displayed.    Basic Metabolic Panel: Recent Labs  Lab 12/30/19 0215 12/31/19 0425 01/01/20 0615 01/01/20 0615 01/01/20 [id number] 01/01/20 1651 01/02/20 0500 01/02/20 1656 01/03/20 0000 01/04/20 0856 01/05/20 0550  NA 136   < > 134*   < > 135  --  133*  --  135 136 139  K 4.7   < > 4.8   < > 4.3  --  4.8  --  4.6 4.6 4.8  CL 104   < > 99  --   --   --  101  --  102 107 110  CO2 21*   < > 22  --   --   --  21*  --  21* 18* 18*  GLUCOSE 61*   < > 110*  --   --   --  160*  --  122* 196* 149*  BUN 44*   < > 48*   --   --   --  59*  --  75* 86* 93*  CREATININE 1.26*   < > 1.21  --   --   --  1.37*  --  1.93* 2.00* 2.32*  CALCIUM 8.7*   < > 8.4*  --   --   --  7.9*  --  7.8* 7.5* 7.5*  MG  --   --  2.2  --   --  2.1 2.2 2.1  --   --   --   PHOS 4.1  --  4.1  --   --  4.9* 4.1 3.1  --   --   --    < > = values in this interval not displayed.   GFR: Estimated Creatinine Clearance: 25.1 mL/min (A) (by C-G formula based on SCr of 2.32 mg/dL (H)). Recent Labs  Lab 01/01/20 0615 01/02/20 0500 01/03/20 0000 01/04/20 0856  WBC 26.1* 24.8* 29.3* 28.9*    Liver Function Tests: Recent Labs  Lab 01/01/20 0615 01/02/20 0500 01/03/20 0000 01/04/20 0856 01/05/20 0550  AST 29 25 22 25 28   ALT 49* 45* 40 41 55*  ALKPHOS 139* 122 110 98 86  BILITOT 0.9 0.6 0.7 0.6 0.7  PROT 6.0* 5.8* 5.6* 5.0* 5.2*  ALBUMIN 2.6* 2.4* 2.2* 1.9* 1.8*   No results for input(s): LIPASE, AMYLASE in the last 168 hours. No results for input(s): AMMONIA in the last 168 hours.  ABG    Component Value Date/Time   PHART 7.363 01/01/2020 0826   PCO2ART 37.3 01/01/2020 0826   PO2ART 170.0 (H) 01/01/2020 0826   HCO3 21.2 01/01/2020 0826   TCO2 22 01/01/2020 0826   ACIDBASEDEF 4.0 (H) 01/01/2020 0826   O2SAT 99.0 01/01/2020 0826     Coagulation Profile: No results for input(s): INR, PROTIME in the last 168 hours.  Cardiac Enzymes: No results for input(s): CKTOTAL, CKMB, CKMBINDEX, TROPONINI in the last 168 hours.  HbA1C: Hgb A1c MFr Bld  Date/Time Value Ref Range Status  12/23/2019 11:05 AM 7.4 (H) 4.8 - 5.6 % Final    Comment:    (NOTE) Pre diabetes:          5.7%-6.4% Diabetes:              >6.4% Glycemic control for   <7.0% adults with diabetes   06/11/2019 08:56 AM 6.4 (H) 4.8 - 5.6 % Final    Comment:    (NOTE) Pre diabetes:          5.7%-6.4% Diabetes:              >6.4% Glycemic control for   <7.0% adults with diabetes     CBG: Recent Labs  Lab 01/04/20 1610 01/04/20 1911 01/04/20 2314  01/05/20 0458 01/05/20 0704  GLUCAP 301* 242* 144* 136* 150*       Critical care time: 30 minutes      Noe Gens, MSN, NP-C [facility] 01/05/2020, 7:51 AM   Please see [email or web address] for pager details.

## 2020-01-05 NOTE — Procedures (Addendum)
Patient Name: Terrence Cisneros  MRN: [id number]  Epilepsy Attending: Lora Havens  Referring Physician/Provider: Dr Simonne Maffucci Date: 01/05/2020 Duration: 23.50 mins  Patient history: 79 yo M COVID + with ams, concern for anoxic injury. EEG to evaluate for seizure.   Level of alertness: comatose  AEDs during EEG study: None  Technical aspects: This EEG study was done with scalp electrodes positioned according to the 10-20 International system of electrode placement. Electrical activity was acquired at a sampling rate of 500Hz  and reviewed with a high frequency filter of 70Hz  and a low frequency filter of 1Hz . EEG data were recorded continuously and digitally stored.   DESCRIPTION: EEG showed continuous generalized 3-6hz  theta-delta slowing. EEG was reactive to tactile stimuli. Hyperventilation and photic stimulation were not performed.  ABNORMALITY - Continuous slow, generalized  IMPRESSION: This study is suggestive of severe diffuse encephalopathy, non specific to etiology.  No seizures or epileptiform discharges were seen throughout the recording.  Khyra Viscuso Barbra Sarks

## 2020-01-05 NOTE — Plan of Care (Signed)
Discussed in front of patient with wife/family with video chat plan of care for the evening, pain management and his current breathing with some teach back displayed and all questions answered at this time.  Explain per CN video chats if best after 2030.

## 2020-01-05 NOTE — Progress Notes (Signed)
ANTICOAGULATION CONSULT NOTE - Follow Up Consult  Pharmacy Consult for Heparin Indication: atrial fibrillation  Allergies  Allergen Reactions  . Lisinopril Other (See Comments)    BP dropped very low    Patient Measurements: Height: 5\' 8"  (172.7 cm) Weight: 149 lb 0.5 oz (67.6 kg) IBW/kg (Calculated) : 68.4 Heparin Dosing Weight: total weight  Vital Signs: Temp: 98.9 F (37.2 C) (01/25 0700) Temp Source: Oral (01/25 0700) BP: 114/63 (01/25 0700) Pulse Rate: 67 (01/25 0700)  Labs: Recent Labs    01/03/20 0000 01/04/20 0856 01/04/20 1723  HGB 11.9* 11.4*  --   HCT 35.8* 33.9*  --   PLT 208 161  --   APTT  --   --  96*  CREATININE 1.93* 2.00*  --     Estimated Creatinine Clearance: 29.1 mL/min (A) (by C-G formula based on SCr of 2 mg/dL (H)).   Medications:  Scheduled:  . vitamin C  500 mg Per Tube Daily  . aspirin EC  81 mg Oral QODAY  . chlorhexidine  15 mL Mouth/Throat BID  . Chlorhexidine Gluconate Cloth  6 each Topical Daily  . dexamethasone (DECADRON) injection  10 mg Intravenous Daily  . famotidine  20 mg Per Tube BID  . feeding supplement (PRO-STAT SUGAR FREE 64)  30 mL Per Tube Daily  . insulin aspart  0-15 Units Subcutaneous Q4H  . insulin detemir  7 Units Subcutaneous Daily  . mouth rinse  15 mL Mouth Rinse 10 times per day  . metoprolol tartrate  50 mg Per Tube BID  . pneumococcal 23 valent vaccine  0.5 mL Intramuscular Tomorrow-1000  . zinc sulfate  220 mg Per Tube Daily   Infusions:  . feeding supplement (VITAL AF 1.2 CAL) Stopped (01/02/20 1840)  . heparin 1,200 Units/hr (01/05/20 0700)  . lactated ringers 30 mL/hr at 01/05/20 0700  . levofloxacin (LEVAQUIN) IV Stopped (01/04/20 1859)  . propofol (DIPRIVAN) infusion Stopped (01/02/20 0842)  . vancomycin      Assessment: 63 YOM admitted 1/11 with COVID-19 pneumonia started on Lovenox (1/13) then apixaban (1/19) for new AFib.  Due to patient's renal fx worsening, pharmacy is now consulted to  switch patient to IV heparin. Last dose of apixaban was 1/23 at 2131.    D-dimer increased to >20 (1/22) Heparin level > 2.2, falsely elevated as expected d/t recent apixaban use.  Monitor aPTT for heparin dosing until both levels correlate. APTT 199 (increased from therapeutic at 96, despite same heparin infusion rate and heparin infusing through left peripheral with labs drawn from right arm) CBC pending Bleeding:  Minor oral bleeding from crack/sore, moderate urethral bleeding, night shift reports blood in stool, but not yet confirmed on day shift per [employer]	[place].  Goal of Therapy:  Heparin level 0.3-0.7 units/ml aPTT 66-102 seconds Monitor platelets by anticoagulation protocol: Yes   Plan:  Repeat aPTT  Hold infusion x1 hour, then reduce to heparin IV infusion at 950 units/hr APTT 8 hours after starting Daily aPTT, heparin level, and CBC Continue to monitor H&H and platelets   Gretta Arab PharmD, BCPS Clinical pharmacist phone 7am- 5pm: [phone] 01/05/2020 7:14 AM

## 2020-01-06 ENCOUNTER — Inpatient Hospital Stay (HOSPITAL_COMMUNITY): Payer: PPO

## 2020-01-06 LAB — CBC
HCT: 30.5 % — ABNORMAL LOW (ref 39.0–52.0)
Hemoglobin: 10 g/dL — ABNORMAL LOW (ref 13.0–17.0)
MCH: 30.4 pg (ref 26.0–34.0)
MCHC: 32.8 g/dL (ref 30.0–36.0)
MCV: 92.7 fL (ref 80.0–100.0)
Platelets: 165 10*3/uL (ref 150–400)
RBC: 3.29 MIL/uL — ABNORMAL LOW (ref 4.22–5.81)
RDW: 14.8 % (ref 11.5–15.5)
WBC: 15.8 10*3/uL — ABNORMAL HIGH (ref 4.0–10.5)
nRBC: 0 % (ref 0.0–0.2)

## 2020-01-06 LAB — BASIC METABOLIC PANEL
Anion gap: 10 (ref 5–15)
BUN: 94 mg/dL — ABNORMAL HIGH (ref 8–23)
CO2: 18 mmol/L — ABNORMAL LOW (ref 22–32)
Calcium: 7.7 mg/dL — ABNORMAL LOW (ref 8.9–10.3)
Chloride: 113 mmol/L — ABNORMAL HIGH (ref 98–111)
Creatinine, Ser: 2.3 mg/dL — ABNORMAL HIGH (ref 0.61–1.24)
GFR calc Af Amer: 30 mL/min — ABNORMAL LOW (ref >60)
GFR calc non Af Amer: 26 mL/min — ABNORMAL LOW (ref >60)
Glucose, Bld: 243 mg/dL — ABNORMAL HIGH (ref 70–99)
Potassium: 5 mmol/L (ref 3.5–5.1)
Sodium: 141 mmol/L (ref 135–145)

## 2020-01-06 LAB — GLUCOSE, CAPILLARY
Glucose-Capillary: 206 mg/dL — ABNORMAL HIGH (ref 70–99)
Glucose-Capillary: 234 mg/dL — ABNORMAL HIGH (ref 70–99)
Glucose-Capillary: 283 mg/dL — ABNORMAL HIGH (ref 70–99)
Glucose-Capillary: 300 mg/dL — ABNORMAL HIGH (ref 70–99)
Glucose-Capillary: 301 mg/dL — ABNORMAL HIGH (ref 70–99)
Glucose-Capillary: 304 mg/dL — ABNORMAL HIGH (ref 70–99)

## 2020-01-06 LAB — HEPARIN LEVEL (UNFRACTIONATED): Heparin Unfractionated: 1.32 IU/mL — ABNORMAL HIGH (ref 0.30–0.70)

## 2020-01-06 LAB — TRIGLYCERIDES: Triglycerides: 141 mg/dL (ref <150)

## 2020-01-06 LAB — APTT
aPTT: 65 seconds — ABNORMAL HIGH (ref 24–36)
aPTT: 51 seconds — ABNORMAL HIGH (ref 24–36)

## 2020-01-06 MED ORDER — HEPARIN (PORCINE) 25000 UT/250ML-% IV SOLN: 950.0000 [IU]/h | INTRAVENOUS | Status: DC

## 2020-01-06 MED ORDER — SODIUM ZIRCONIUM CYCLOSILICATE 10 G PO PACK
10.0000 g | PACK | Freq: Once | ORAL | Status: AC
  Administered 2020-01-06: 10 g via ORAL
  Filled 2020-01-06: qty 1

## 2020-01-06 MED ORDER — INSULIN ASPART 100 UNIT/ML ~~LOC~~ SOLN
3.0000 [IU] | SUBCUTANEOUS | Status: DC
  Administered 2020-01-06 – 2020-01-07 (×5): 3 [IU] via SUBCUTANEOUS

## 2020-01-06 MED ORDER — VANCOMYCIN VARIABLE DOSE PER UNSTABLE RENAL FUNCTION (PHARMACIST DOSING): Status: DC

## 2020-01-06 MED ORDER — HEPARIN (PORCINE) 25000 UT/250ML-% IV SOLN
1000.0000 [IU]/h | INTRAVENOUS | Status: DC
  Administered 2020-01-06: 1000 [IU]/h via INTRAVENOUS
  Filled 2020-01-06: qty 250

## 2020-01-06 MED ORDER — VANCOMYCIN HCL IN DEXTROSE 1-5 GM/200ML-% IV SOLN
1000.0000 mg | INTRAVENOUS | Status: DC
  Administered 2020-01-06: 1000 mg via INTRAVENOUS
  Filled 2020-01-06: qty 200

## 2020-01-06 NOTE — Progress Notes (Signed)
Pharmacy Antibiotic Note  Terrence Cisneros is a 79 y.o. male admitted on 12/16/2019 with pneumonia.  Pharmacy has been consulted for Levaquin and Vancomycin dosing for stenotrophomonas and staph epi pneumonia. SCr increased to 2.3 (baseline ~ 1.2-1.5), WBC remains elevated/improved at 15.8, Afebrile  Plan: Levaquin 750mg  IV q48h Vancomycin decreased to 1g IV q36h  Measure Vanc peak and trough at steady state - or check random level as needed for renal function. Goal AUC = 400 - 550.  Est AUC 475 using SCr 2.3  Follow up renal function, culture results, and clinical course.    Height: 5\' 8"  (172.7 cm) Weight: 167 lb 5.3 oz (75.9 kg) IBW/kg (Calculated) : 68.4  Temp (24hrs), Avg:98.3 F (36.8 C), Min:97.8 F (36.6 C), Max:98.5 F (36.9 C)  Recent Labs  Lab 01/01/20 0615 01/01/20 0615 01/02/20 0500 01/03/20 0000 01/04/20 0856 01/05/20 0550 01/05/20 0940 01/06/20 0602  WBC 26.1*   < > 24.8* 29.3* 28.9*  --  15.6* 15.8*  CREATININE 1.21  --  1.37* 1.93* 2.00* 2.32*  --   --    < > = values in this interval not displayed.    Estimated Creatinine Clearance: 25.4 mL/min (A) (by C-G formula based on SCr of 2.32 mg/dL (H)).    Allergies  Allergen Reactions  . Lisinopril Other (See Comments)    BP dropped very low    Antimicrobials this admission:  1/11 Remdesivir > 1/15 1/11 CTX/Azith >> 1/15 1/24 Levaquin >>  1/24 Vancomycin >>  Dose adjustments this admission:   Microbiology results:  1/23 MRSA PCR: neg 1/21 Endotracheal cxt: staphylococcus epidermis (sens pending) and stenotrophomonas maltophilia (sens to LVQ, Bactrim)  Thank you for allowing pharmacy to be a part of this patient's care.  Gretta Arab PharmD, BCPS Clinical pharmacist phone 7am- 5pm: [phone] 01/06/2020 11:18 AM

## 2020-01-06 NOTE — Progress Notes (Signed)
NAME:  Terrence Cisneros, MRN:  [id number], DOB:  Jul 26, 1941, LOS: 15 ADMISSION DATE:  12/17/2019, CONSULTATION DATE:  1/22 REFERRING MD:  Terrence Cisneros, CHIEF COMPLAINT:  dyspnea   Brief History   79 y/o male presented with nausea, dyspnea, cough.  Didn't improve with outpt antibiotic and prednisone. Found to have hypoxia from COVID 19 pneumonia.  Reports having exposure to COVID during Christmas. Noted to have elevated procalcitonin also and treated with ABX for CAP.  Had transient A fib with RVR. Developed worsening hypoxia with altered mental status 1/21 and transferred to ICU.  Past Medical History  Hypothyroidism, OA, CKD 2, Allergies, HTN, DM, Neuropathy  Significant Hospital Events   1/11 Admit 1/13 A fib with RVR, add amiodarone 1/17 d/c amiodarone and start lopressor 1/20 transition from lovenox to eliquis 1/21 worsening hypoxia with altered mental status >> to ICU and intubated 1/25 Weaning but mental status barrier to extubation. EEG performed, severe diffuse encephalopathy  Consults:  PCCM  Procedures:  1/21 ETT >>   Significant Diagnostic Tests:  CT angio chest 1/12 >> b/l GGO, atherosclerosis CT head 1/21 >> retropharyngeal gas, NAICP CT chest 1/21 >> small right sided pneumothorax, extensive pneumomediastinum, bilatearl infiltrates consistent with COVID 19 EEG 1/25 >> study suggestive of severe diffuse encephalopathy, non-specific etiology  Micro Data:  SARS CoV2 Ag 1/11 >> Positive Sputum 1/21 >> yeast, stenotrophomonas maltophilia, staph epidermis >>  Antimicrobials:  Decadron 1/11 > 1/24 Remdesivir 1/11 >> 1/15 Convalescent plasma 1/14 >> Rocephin 1/11 >> 1/15 Zithromax 1/11 >> 1/15    Vanco 1/24 >>  Levaquin 1/24 >>   Interim history/subjective:  Afebrile, WBC decreased PEEP 5, FiO2 40%.  Weaned for large majority of day on 1/25 but mental status barrier to extubation.  I/O - 900 UOP, 1.1L+ in 24h Glucose range 114 - 234 Bloody urine in cannister.  Minimal  dried blood on nose.   Objective   Blood pressure (!) 156/63, pulse (!) 101, temperature 98.5 F (36.9 C), temperature source Oral, resp. rate (!) 36, height 5\' 8"  (1.727 m), weight 75.9 kg, SpO2 (!) 87 %.    Vent Mode: PRVC FiO2 (%):  [30 %-40 %] 40 % Set Rate:  [22 bmp] 22 bmp Vt Set:  [480 mL] 480 mL PEEP:  [5 cmH20] 5 cmH20 Pressure Support:  [5 cmH20] 5 cmH20 Plateau Pressure:  [10 cmH20-25 cmH20] 25 cmH20   Intake/Output Summary (Last 24 hours) at 01/06/2020 0753 Last data filed at 01/06/2020 0700 Gross per 24 hour  Intake 2090.81 ml  Output 900 ml  Net 1190.81 ml   Filed Weights   01/05/20 0500 01/05/20 1200 01/06/20 0131  Weight: 67.6 kg 75.4 kg 75.9 kg    Examination: General: elderly male lying in bed on vent in NAD, appears critically ill  HEENT: MM pink/moist, ETT, pupils =/reactive Neuro: no response to verbal stimuli, + cough with suction  CV: s1s2 RRR, no m/r/g PULM:  Tachypnea / increased effort on vent, lungs bilaterally coarse / diminished  GI: soft, bsx4 active  Extremities: warm/dry, no edema, BLE cool to touch  Skin: no rashes or lesions  CXR 1/26 >> no new images   Resolved Hospital Problem list     Assessment & Plan:   ARDS secondary to COVID 19 -PSV wean as tolerated during day -PRVC support every evening -VAP prevention measures  -mental status remains barrier to extubation  -minimize sedation as able   Stenotrophomonas, Staph Epidermis HCAP -continue vanco, levaquin  -await final culture  sensitivities and narrow abx  Pneumomediastinum Small R pneumothorax -follow intermittent CXR -if clinical change, assess STAT CXR and consider tension pneumothorax  Need for sedation -PRN low dose fentanyl    AKI Hx of Renal Stones -follow up BMP now -no indication for HD, will need to discuss with family  -FENa 1.03 / likely pre-renal, await renal US -LR at 30 ml/hr -renal dose medications -Trend BMP / urinary output -Replace electrolytes  as indicated -Avoid nephrotoxic agents, ensure adequate renal perfusion  Acute toxic metabolic encephalopathy Worrisome for anoxic injury given hypoxemia on 1/21 AM. Previously discussed with Neuro. Additional factors include AKI / possible poor renal clearance of drugs.  EEG with severe diffuse encephalopathy but no epileptiform activity.   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 -tele monitoring  -continue heparin per pharmacy, metoprolol  -continue metoprolol, heparin gtt  DM2 -SSI, moderate scale  -Levemir 7 units QD   Anemia of chronic disease Mild Thrombocytopenia -monitor for further bleeding (small amt from nose/ETT, around foley, dark stool) -transfuse for Hgb <7% -heparin as above  -continue ASA   Cold feet 1/23, good cap refill, + doppler pulses  -warming measures and follow clinical exam    Best practice:  Diet: tube feeding  Pain/Anxiety/Delirium protocol (if indicated): yes, RASS target 0 to -1 VAP protocol (if indicated): yes DVT prophylaxis: Eliquis GI prophylaxis: famotidine Glucose control: SSI Mobility: bed rest Code Status: full Family Communication: Terrence Cisneros (wife) updated 1/26 via phone.  She expresses that Terrence Cisneros has "always said he would not want to be kept alive on machines".  She indicates that his quality of life was very important to him.  We reviewed the concerns of his altered mental status, pending MRI and all causes of his mental status change to include acute infection, frail elderly, COVID, and renal injury.  We also discussed the findings on EEG.  Currently no acute indication for HD 1/26.  Will continue to monitor but she does indicate he would not want dialysis unless it meant he would recover from illness & would likely not allow him to have the quality of life he would want.  She agrees that he would not want CPR in the event of arrest.  Plan is to wait for planned  MRI.  Pending review, if pathological findings, likely would want to stop aggressive interventions.  DNR placed.  Will continue all aggressive measures up to the point of HD / CPR for now.  Disposition: ICU  Labs   CBC: Recent Labs  Lab 01/02/20 0500 01/03/20 0000 01/04/20 0856 01/05/20 0940 01/06/20 0602  WBC 24.8* 29.3* 28.9* 15.6* 15.8*  NEUTROABS  --  26.6* 26.5*  --   --   HGB 11.6* 11.9* 11.4* 10.5* 10.0*  HCT 35.1* 35.8* 33.9* 31.2* 30.5*  MCV 90.7 89.3 89.7 90.7 92.7  PLT 221 208 161 138* [id number]    Basic Metabolic Panel: Recent Labs  Lab 01/01/20 0615 01/01/20 0615 01/01/20 0826 01/01/20 1651 01/02/20 0500 01/02/20 1656 01/03/20 0000 01/04/20 0856 01/05/20 0550  NA 134*   < > 135  --  133*  --  135 136 139  K 4.8   < > 4.3  --  4.8  --  4.6 4.6 4.8  CL 99  --   --   --  101  --  102 107 110  CO2 22  --   --   --  21*  --  21* 18* 18*  GLUCOSE 110*  --   --   --  160*  --  122* 196* 149*  BUN 48*  --   --   --  59*  --  75* 86* 93*  CREATININE 1.21  --   --   --  1.37*  --  1.93* 2.00* 2.32*  CALCIUM 8.4*  --   --   --  7.9*  --  7.8* 7.5* 7.5*  MG 2.2  --   --  2.1 2.2 2.1  --   --   --   PHOS 4.1  --   --  4.9* 4.1 3.1  --   --   --    < > = values in this interval not displayed.   GFR: Estimated Creatinine Clearance: 25.4 mL/min (A) (by C-G formula based on SCr of 2.32 mg/dL (H)). Recent Labs  Lab 01/03/20 0000 01/04/20 0856 01/05/20 0940 01/06/20 0602  WBC 29.3* 28.9* 15.6* 15.8*    Liver Function Tests: Recent Labs  Lab 01/01/20 0615 01/02/20 0500 01/03/20 0000 01/04/20 0856 01/05/20 0550  AST 29 25 22 25 28   ALT 49* 45* 40 41 55*  ALKPHOS 139* 122 110 98 86  BILITOT 0.9 0.6 0.7 0.6 0.7  PROT 6.0* 5.8* 5.6* 5.0* 5.2*  ALBUMIN 2.6* 2.4* 2.2* 1.9* 1.8*   No results for input(s): LIPASE, AMYLASE in the last 168 hours. No results for input(s): AMMONIA in the last 168 hours.  ABG    Component Value Date/Time   PHART 7.363 01/01/2020 0826    PCO2ART 37.3 01/01/2020 0826   PO2ART 170.0 (H) 01/01/2020 0826   HCO3 21.2 01/01/2020 0826   TCO2 22 01/01/2020 0826   ACIDBASEDEF 4.0 (H) 01/01/2020 0826   O2SAT 99.0 01/01/2020 0826     Coagulation Profile: No results for input(s): INR, PROTIME in the last 168 hours.  Cardiac Enzymes: No results for input(s): CKTOTAL, CKMB, CKMBINDEX, TROPONINI in the last 168 hours.  HbA1C: Hgb A1c MFr Bld  Date/Time Value Ref Range Status  12/23/2019 11:05 AM 7.4 (H) 4.8 - 5.6 % Final    Comment:    (NOTE) Pre diabetes:          5.7%-6.4% Diabetes:              >6.4% Glycemic control for   <7.0% adults with diabetes   06/11/2019 08:56 AM 6.4 (H) 4.8 - 5.6 % Final    Comment:    (NOTE) Pre diabetes:          5.7%-6.4% Diabetes:              >6.4% Glycemic control for   <7.0% adults with diabetes     CBG: Recent Labs  Lab 01/05/20 1708 01/05/20 2018 01/05/20 2328 01/06/20 0323 01/06/20 0709  GLUCAP 197* 199* 159* 206* 234*       Critical care time: 51 minutes      Noe Gens, MSN, NP-C [facility] 01/06/2020, 7:53 AM   Please see [email or web address] for pager details.

## 2020-01-06 NOTE — Progress Notes (Signed)
ANTICOAGULATION CONSULT NOTE - Follow Up Consult  Pharmacy Consult for Heparin Indication: atrial fibrillation  Allergies  Allergen Reactions  . Lisinopril Other (See Comments)    BP dropped very low    Patient Measurements: Height: 5\' 8"  (172.7 cm) Weight: 167 lb 5.3 oz (75.9 kg) IBW/kg (Calculated) : 68.4 Heparin Dosing Weight: total weight  Vital Signs: Temp: 98.5 F (36.9 C) (01/26 0700) Temp Source: Oral (01/26 0700) BP: 156/63 (01/26 0700) Pulse Rate: 101 (01/26 0700)  Labs: Recent Labs    01/04/20 0856 01/04/20 0856 01/04/20 1723 01/05/20 0550 01/05/20 0940 01/05/20 2005 01/06/20 0602  HGB 11.4*   < >  --   --  10.5*  --  10.0*  HCT 33.9*  --   --   --  31.2*  --  30.5*  PLT 161  --   --   --  138*  --  165  APTT  --    < >   < > 199* 152* 97* 65*  HEPARINUNFRC  --   --   --  >2.20*  --   --   --   CREATININE 2.00*  --   --  2.32*  --   --   --    < > = values in this interval not displayed.    Estimated Creatinine Clearance: 25.4 mL/min (A) (by C-G formula based on SCr of 2.32 mg/dL (H)).   Medications:  Scheduled:  . vitamin C  500 mg Per Tube Daily  . aspirin EC  81 mg Oral QODAY  . chlorhexidine  15 mL Mouth/Throat BID  . Chlorhexidine Gluconate Cloth  6 each Topical Daily  . dexamethasone (DECADRON) injection  10 mg Intravenous Daily  . famotidine  20 mg Per Tube Daily  . feeding supplement (PRO-STAT SUGAR FREE 64)  30 mL Per Tube Daily  . insulin aspart  0-15 Units Subcutaneous Q4H  . insulin detemir  7 Units Subcutaneous Daily  . mouth rinse  15 mL Mouth Rinse 10 times per day  . metoprolol tartrate  50 mg Per Tube BID  . pneumococcal 23 valent vaccine  0.5 mL Intramuscular Tomorrow-1000  . zinc sulfate  220 mg Per Tube Daily   Infusions:  . feeding supplement (VITAL AF 1.2 CAL) 1,000 mL (01/06/20 0646)  . heparin 900 Units/hr (01/06/20 0700)  . lactated ringers 30 mL/hr at 01/06/20 0700  . levofloxacin (LEVAQUIN) IV Stopped (01/04/20  1859)  . vancomycin Stopped (01/05/20 1145)    Assessment: 60 YOM admitted 1/11 with COVID-19 pneumonia started on Lovenox (1/13) then apixaban (1/19) for new AFib.  Due to patient's renal fx worsening, pharmacy is now consulted to switch patient to IV heparin. Last dose of apixaban was 1/23 at 2131.    D-dimer increased to >20 (1/22) Heparin level > 2.2, falsely elevated as expected d/t recent apixaban use.  Monitor aPTT for heparin dosing until both levels correlate. APTT decreased to 65 on lower heparin infusion rate of 900 units/hr for lower goal d/t bleeding.   CBC:  Hgb remains low/stable at 10, Plt stable at 165k Bleeding:  Minor oral bleeding from crack/sore, moderate urethral bleeding, stools are "dark" but without active bleeding noted.  RN Curt Jews to monitor closely for any changes.  Goal of Therapy:  Heparin level 0.3-0.7 units/ml aPTT 66-102 seconds Monitor platelets by anticoagulation protocol: Yes   Plan:  Increase to heparin IV infusion at 950 units/hr APTT 8 hours after rate change. Daily aPTT, heparin level, and  CBC Continue to monitor H&H and platelets   Gretta Arab PharmD, BCPS Clinical pharmacist phone 7am- 5pm: [phone] 01/06/2020 7:30 AM

## 2020-01-06 NOTE — Progress Notes (Signed)
ANTICOAGULATION CONSULT NOTE - Follow Up Consult  Pharmacy Consult for Heparin Indication: atrial fibrillation  Allergies  Allergen Reactions  . Lisinopril Other (See Comments)    BP dropped very low    Patient Measurements: Height: 5\' 8"  (172.7 cm) Weight: 167 lb 5.3 oz (75.9 kg) IBW/kg (Calculated) : 68.4 Heparin Dosing Weight: total weight  Vital Signs: Temp: 98.7 F (37.1 C) (01/26 1600) Temp Source: Oral (01/26 1600) BP: 151/70 (01/26 1900) Pulse Rate: 106 (01/26 1900)  Labs: Recent Labs    01/04/20 0856 01/04/20 0856 01/04/20 1723 01/05/20 0550 01/05/20 0940 01/05/20 0940 01/05/20 2005 01/06/20 0602 01/06/20 1755  HGB 11.4*   < >  --   --  10.5*  --   --  10.0*  --   HCT 33.9*  --   --   --  31.2*  --   --  30.5*  --   PLT 161  --   --   --  138*  --   --  165  --   APTT  --    < >   < > 199* 152*   < > 97* 65* 51*  HEPARINUNFRC  --   --   --  >2.20*  --   --   --  1.32*  --   CREATININE 2.00*  --   --  2.32*  --   --   --  2.30*  --    < > = values in this interval not displayed.    Estimated Creatinine Clearance: 25.6 mL/min (A) (by C-G formula based on SCr of 2.3 mg/dL (H)).   Medications:  Scheduled:  . vitamin C  500 mg Per Tube Daily  . aspirin EC  81 mg Oral QODAY  . chlorhexidine  15 mL Mouth/Throat BID  . Chlorhexidine Gluconate Cloth  6 each Topical Daily  . famotidine  20 mg Per Tube Daily  . feeding supplement (PRO-STAT SUGAR FREE 64)  30 mL Per Tube Daily  . insulin aspart  0-15 Units Subcutaneous Q4H  . insulin aspart  3 Units Subcutaneous Q4H  . insulin detemir  7 Units Subcutaneous Daily  . mouth rinse  15 mL Mouth Rinse 10 times per day  . metoprolol tartrate  50 mg Per Tube BID  . pneumococcal 23 valent vaccine  0.5 mL Intramuscular Tomorrow-1000  . vancomycin variable dose per unstable renal function (pharmacist dosing)   Does not apply See admin instructions  . zinc sulfate  220 mg Per Tube Daily   Infusions:  . feeding  supplement (VITAL AF 1.2 CAL) 1,000 mL (01/06/20 0646)  . heparin 950 Units/hr (01/06/20 0752)  . lactated ringers 30 mL/hr at 01/06/20 0700  . levofloxacin (LEVAQUIN) IV 750 mg (01/06/20 0806)  . vancomycin      Assessment: [name] admitted 1/11 with COVID-19 pneumonia started on Lovenox (1/13) then apixaban (1/19) for new AFib.  Due to patient's renal fx worsening, pharmacy is now consulted to switch patient to IV heparin. Last dose of apixaban was 1/23 at 2131.    D-dimer increased to >20 (1/22) Heparin level > 2.2, falsely elevated as expected d/t recent apixaban use.  Monitor aPTT for heparin dosing until both levels correlate. APTT decreased to 65 on lower heparin infusion rate of 900 units/hr for lower goal d/t bleeding.   CBC:  Hgb remains low/stable at 10, Plt stable at 165k Bleeding:  Minor oral bleeding from crack/sore, moderate urethral bleeding, stools are "dark" but without  active bleeding noted.  RN Curt Jews to monitor closely for any changes.  PM: aPTT decreased to 51 seconds despite rate increase to 950 units/hr.  RN reports that urine color unchanged from above.  Goal of Therapy:  Heparin level 0.3-0.7 units/ml aPTT 66-102 seconds Monitor platelets by anticoagulation protocol: Yes   Plan:  Increase to heparin IV infusion to 1000 units/hr Daily aPTT, heparin level, and CBC Continue to monitor H&H and platelets   Peggyann Juba, PharmD, [place]: [phone] 01/06/2020 7:08 PM

## 2020-01-07 ENCOUNTER — Inpatient Hospital Stay (HOSPITAL_COMMUNITY): Payer: PPO

## 2020-01-07 LAB — CBC
HCT: 32.3 % — ABNORMAL LOW (ref 39.0–52.0)
Hemoglobin: 10.7 g/dL — ABNORMAL LOW (ref 13.0–17.0)
MCH: 30.1 pg (ref 26.0–34.0)
MCHC: 33.1 g/dL (ref 30.0–36.0)
MCV: 90.7 fL (ref 80.0–100.0)
Platelets: 213 10*3/uL (ref 150–400)
RBC: 3.56 MIL/uL — ABNORMAL LOW (ref 4.22–5.81)
RDW: 15 % (ref 11.5–15.5)
WBC: 20.4 10*3/uL — ABNORMAL HIGH (ref 4.0–10.5)
nRBC: 0 % (ref 0.0–0.2)

## 2020-01-07 LAB — POCT I-STAT 7, (LYTES, BLD GAS, ICA,H+H)
Acid-base deficit: 7 mmol/L — ABNORMAL HIGH (ref 0.0–2.0)
Bicarbonate: 18.4 mmol/L — ABNORMAL LOW (ref 20.0–28.0)
Calcium, Ion: 1.08 mmol/L — ABNORMAL LOW (ref 1.15–1.40)
HCT: 29 % — ABNORMAL LOW (ref 39.0–52.0)
Hemoglobin: 9.9 g/dL — ABNORMAL LOW (ref 13.0–17.0)
O2 Saturation: 90 %
Patient temperature: 99.1
Potassium: 5.2 mmol/L — ABNORMAL HIGH (ref 3.5–5.1)
Sodium: 142 mmol/L (ref 135–145)
TCO2: 20 mmol/L — ABNORMAL LOW (ref 22–32)
pCO2 arterial: 38 mmHg (ref 32.0–48.0)
pH, Arterial: 7.295 — ABNORMAL LOW (ref 7.350–7.450)
pO2, Arterial: 65 mmHg — ABNORMAL LOW (ref 83.0–108.0)

## 2020-01-07 LAB — BASIC METABOLIC PANEL
Anion gap: 12 (ref 5–15)
BUN: 122 mg/dL — ABNORMAL HIGH (ref 8–23)
CO2: 18 mmol/L — ABNORMAL LOW (ref 22–32)
Calcium: 7.5 mg/dL — ABNORMAL LOW (ref 8.9–10.3)
Chloride: 112 mmol/L — ABNORMAL HIGH (ref 98–111)
Creatinine, Ser: 2.88 mg/dL — ABNORMAL HIGH (ref 0.61–1.24)
GFR calc Af Amer: 23 mL/min — ABNORMAL LOW (ref >60)
GFR calc non Af Amer: 20 mL/min — ABNORMAL LOW (ref >60)
Glucose, Bld: 190 mg/dL — ABNORMAL HIGH (ref 70–99)
Potassium: 4.7 mmol/L (ref 3.5–5.1)
Sodium: 142 mmol/L (ref 135–145)

## 2020-01-07 LAB — CULTURE, RESPIRATORY W GRAM STAIN

## 2020-01-07 LAB — GLUCOSE, CAPILLARY
Glucose-Capillary: 203 mg/dL — ABNORMAL HIGH (ref 70–99)
Glucose-Capillary: 120 mg/dL — ABNORMAL HIGH (ref 70–99)
Glucose-Capillary: 92 mg/dL (ref 70–99)

## 2020-01-07 LAB — APTT: aPTT: 57 seconds — ABNORMAL HIGH (ref 24–36)

## 2020-01-07 LAB — HEPARIN LEVEL (UNFRACTIONATED): Heparin Unfractionated: 1.03 IU/mL — ABNORMAL HIGH (ref 0.30–0.70)

## 2020-01-07 MED ORDER — GLYCOPYRROLATE 0.2 MG/ML IJ SOLN: 0.2000 mg | INTRAMUSCULAR | Status: DC | PRN

## 2020-01-07 MED ORDER — SODIUM BICARBONATE 8.4 % IV SOLN
INTRAVENOUS | Status: AC
  Administered 2020-01-07: 50 meq via INTRAVENOUS
  Filled 2020-01-07: qty 50

## 2020-01-07 MED ORDER — MIDAZOLAM HCL 2 MG/2ML IJ SOLN
2.0000 mg | Freq: Once | INTRAMUSCULAR | Status: AC
  Administered 2020-01-07: 2 mg via INTRAVENOUS

## 2020-01-07 MED ORDER — MORPHINE 100MG IN NS 100ML (1MG/ML) PREMIX INFUSION
0.0000 mg/h | INTRAVENOUS | Status: DC
  Administered 2020-01-07: 5 mg/h via INTRAVENOUS
  Filled 2020-01-07: qty 100

## 2020-01-07 MED ORDER — MORPHINE BOLUS VIA INFUSION
5.0000 mg | INTRAVENOUS | Status: DC | PRN
  Administered 2020-01-07: 5 mg via INTRAVENOUS
  Filled 2020-01-07: qty 5

## 2020-01-07 MED ORDER — INSULIN ASPART 100 UNIT/ML ~~LOC~~ SOLN
4.0000 [IU] | SUBCUTANEOUS | Status: DC
  Administered 2020-01-07: 4 [IU] via SUBCUTANEOUS

## 2020-01-07 MED ORDER — DEXTROSE 5 % IV SOLN: INTRAVENOUS | Status: DC

## 2020-01-07 MED ORDER — HEPARIN (PORCINE) 25000 UT/250ML-% IV SOLN
1100.0000 [IU]/h | INTRAVENOUS | Status: DC
  Administered 2020-01-07: 1100 [IU]/h via INTRAVENOUS

## 2020-01-07 MED ORDER — GLYCOPYRROLATE 1 MG PO TABS
1.0000 mg | ORAL_TABLET | ORAL | Status: DC | PRN
  Filled 2020-01-07: qty 1

## 2020-01-07 MED ORDER — POLYVINYL ALCOHOL 1.4 % OP SOLN
1.0000 [drp] | Freq: Four times a day (QID) | OPHTHALMIC | Status: DC | PRN
  Filled 2020-01-07: qty 15

## 2020-01-07 MED ORDER — SODIUM BICARBONATE 8.4 % IV SOLN: 50.0000 meq | Freq: Once | INTRAVENOUS | Status: AC

## 2020-01-07 MED ORDER — PROPOFOL 1000 MG/100ML IV EMUL
INTRAVENOUS | Status: AC
  Administered 2020-01-07: 30 ug/kg/min via INTRAVENOUS
  Filled 2020-01-07: qty 100

## 2020-01-07 MED ORDER — MORPHINE SULFATE (PF) 2 MG/ML IV SOLN
2.0000 mg | INTRAVENOUS | Status: DC | PRN
  Administered 2020-01-07 (×2): 2 mg via INTRAVENOUS
  Filled 2020-01-07 (×2): qty 1

## 2020-01-07 MED ORDER — PROPOFOL 1000 MG/100ML IV EMUL: 0.0000 ug/kg/min | INTRAVENOUS | Status: DC

## 2020-01-12 NOTE — Progress Notes (Signed)
Informed by RN that family visited with patient per policy.  Comfort care orders placed.     Noe Gens, MSN, NP-C [facility] January 09, 2020, 2:39 PM   Please see [email or web address] for pager details.

## 2020-01-12 NOTE — Death Summary Note (Signed)
DEATH SUMMARY   Patient Details  Name: Terrence Cisneros MRN: [id number] DOB: Nov 01, 1941  Admission/Discharge Information   Admit Date:  01-17-2020  Date of Death: Date of Death: 2020/02/02  Time of Death: Time of Death: 03-31-1519  Length of Stay: 22-Mar-2023  Referring Physician: [name], MD   Reason(s) for Hospitalization  acute hypoxic resp failure  Diagnoses  Preliminary cause of death: Acute respiratory distress syndrome (ARDS) due to COVID-19 virus [facility]) Secondary Diagnoses (including complications and co-morbidities):  Principal Problem:   COVID-19 Active Problems:   Essential hypertension   Type 2 diabetes mellitus with hyperglycemia, without long-term current use of insulin (HCC)   AKI (acute kidney injury) ([place])   Lactic acidosis   Brief Hospital Course (including significant findings, care, treatment, and services provided and events leading to death)  KENDEL Cisneros is a 79 y.o. year old male who per H&P on 2023-01-16 by Dr Olevia Bowens has a past medical history significant of acquired hypothyroidism, osteoarthritis, radiculopathy, chronic kidney disease, environmental allergies, hypertension, neuropathy, type 2 diabetes mellitus who is coming to the emergency department due to viral symptoms associated with progressively worse dyspnea for about 10 days and history of exposure to COVID-19 around Christmas with her causing the later tested positive for SARS 2 coronavirus.  He was taking amoxicillin at home without any changes in his symptoms.  He denies fever, chills, but complains of sore throat, occasionally productive cough and dyspnea.  No chest pain, palpitations, dizziness, abdominal pain, nausea, vomiting, melena or hematochezia.  No dysuria, frequency or hematuria.  No polyuria, polydipsia, polyphagia or blurred vision.  ED Course: Patient was given 6 mg of dexamethasone IVP.  I added 1000 mL of LR bolus.  WBC was 15.9, hemoglobin 13.1 g/dL and platelets 273.  Fibrinogen was 682,  D-dimer 1.88, LDH 312, CRP 21.1 and procalcitonin 37.82.  Sodium 133, potassium 3.6, chloride 95 and CO2 23 mmol/L.  Glucose 208, BUN 56 and creatinine 2.26 mg/dL.  Troponin was 45 and 47 ng/L.  Lactic acid was 2.8 and then 2.3 mmol/L.  BNP was 104.8 pg/mL.  Pt was admitted to [facility] on floor with treatment for covid 19 including ccp, remdesivir and decadron. He was not treated with actemra 2/2 elevated pct at admission. During his hospitalization his hypoxemia worsened req 12L HFNC he was treated with ctx and azithro for suspected superimposed bacterial pneumonia.   unfortunately on 1/21 pt decompensated req intubation. Per note from dr Lake Bells on 1/21: Called to code blue 0650 this morning Briefly, 79 yo male who lives independently treated here for COVID pneumonia, requiring 10L O2 yesterday, was pulling off oxygen all night, but remained responsive, talkative.  At baseline was having conversation with attending MD yesterday.  This morning he pulled off his O2 and his O2 saturation dropped to 60% and he became unresponsive.  Remained unresponsive after placing O2 back and with return of normal O2 saturation.  Thick secretions in airway, no cough.  Brought to ICU, intubated, vent, sedation orders were written.  I think that this acute encephalopathy was mostly driven by hypoxemia, but agree that performing a head CT is reasonable as well.  Will order head CT, hand off to day team for further care.   Pt started to have some improvement on vent requirements and sedation was weaned. However he remains unresponsive off sedation for ultiple days. Neuro was reached out to and decision was made to do a mri for ? Hypoxic injury to brain on day  7 from event on 1/21. We were awaiting timeframe for that (1/28) while closely following his resp status and renal function (that was declining). Ongoing d/w family  Revealed that pt would not want long term support or long term dialysis if needed. On evening of 1/26 pt began  to rapidly decline. With increasing oxygen requirement and wob. His bp and HR escalated and renal function worsened. Sedation was offered and AMI/PE r/o to best of our ability with bedside modalities as pt was too unstable to transport.   Family opted for comfort care and on 1/27 pt expired after a family visit, peacefully and with staff by his side.       Pertinent Labs and Studies  Significant Diagnostic Studies CT HEAD WO CONTRAST  Result Date: 01/01/2020 CLINICAL DATA:  Mental status change.  Rule out infection EXAM: CT HEAD WITHOUT CONTRAST TECHNIQUE: Contiguous axial images were obtained from the base of the skull through the vertex without intravenous contrast. COMPARISON:  CT head 12/15/2010 FINDINGS: Brain: Mild atrophy without hydrocephalus. Mild white matter hypodensity bilaterally. No acute infarct, hemorrhage, mass. Vascular: Negative for hyperdense vessel Skull: No acute skeletal abnormality. Sinuses/Orbits: Endotracheal tube and NG tubes in place. Air-fluid levels in the paranasal sinuses. Retropharyngeal gas possibly due to dramatic intubation. Negative orbit. Other: None IMPRESSION: No acute intracranial abnormality Retropharyngeal gas possibly due to traumatic intubation. Electronically Signed   By: Franchot Gallo M.D.   On: 01/01/2020 10:41   CT CHEST WO CONTRAST  Result Date: 01/01/2020 CLINICAL DATA:  Pneumothorax, pneumomediastinum, pneumonia due to COVID-19 EXAM: CT CHEST WITHOUT CONTRAST TECHNIQUE: Multidetector CT imaging of the chest was performed following the standard protocol without IV contrast. Sagittal and coronal MPR images reconstructed from axial data set. COMPARISON:  CT angio chest 12/23/2019, chest radiograph 01/01/2020 FINDINGS: Cardiovascular: Atherosclerotic calcifications aorta and coronary arteries. Heart normal size. No pericardial effusion. Mediastinum/Nodes: Nasogastric tube traverses esophagus into stomach to the level of the distal antrum. Tip of  endotracheal tube 2.1 cm above carina. Extensive pneumomediastinum with gas extending into the cervical regions bilaterally, BILATERAL supraclavicular regions, and RIGHT axilla. Base of cervical region otherwise normal appearance. No definite thoracic adenopathy. Lungs/Pleura: Small RIGHT pneumothorax anteriorly and anterolaterally. No LEFT pneumothorax. Patchy airspace infiltrates throughout both lungs consistent with COVID-19 pneumonia. No pleural effusion or definite pulmonary mass. Upper Abdomen: Fatty infiltration of liver. Small nonobstructing calculus 4 mm diameter at upper pole RIGHT kidney. Gallbladder surgically absent. Musculoskeletal: Respiratory motion artifacts limit rib assessment. No acute osseous findings. IMPRESSION: Small RIGHT-sided pneumothorax. Extensive pneumomediastinum with gas extending into the cervical regions and supraclavicular regions bilaterally as well as RIGHT axilla. Extensive BILATERAL pulmonary infiltrates consistent with COVID-19 pneumonia. Nonobstructing 4 mm upper pole RIGHT renal calculus. Aortic Atherosclerosis (ICD10-I70.0). Electronically Signed   By: Lavonia Dana M.D.   On: 01/01/2020 10:44   CT ANGIO CHEST PE W OR WO CONTRAST  Result Date: 12/23/2019 CLINICAL DATA:  Shortness of breath. Coronavirus exposure in late December. Fever and shortness of breath. EXAM: CT ANGIOGRAPHY CHEST WITH CONTRAST TECHNIQUE: Multidetector CT imaging of the chest was performed using the standard protocol during bolus administration of intravenous contrast. Multiplanar CT image reconstructions and MIPs were obtained to evaluate the vascular anatomy. CONTRAST:  7mL OMNIPAQUE IOHEXOL 350 MG/ML SOLN COMPARISON:  Chest radiography yesterday. FINDINGS: Cardiovascular: Pulmonary arterial opacification is good. No pulmonary emboli. The aorta shows advanced atherosclerotic disease with irregular plaque. No aneurysm or dissection. Heart size is normal. There is coronary artery calcification.  Mediastinum/Nodes:  No mediastinal mass or adenopathy. Small mediastinal lymph nodes are present. Single exception to this is a right paratracheal node in the upper chest measuring 1.4 cm in diameter. Lungs/Pleura: Widespread bilateral hazy mid and lower lung pulmonary infiltrates typical of coronavirus infection. No dense consolidation, collapse or effusion. No sign of underlying lung lesion. Upper Abdomen: Negative Musculoskeletal: Ordinary degenerative changes affect the spine. Review of the MIP images confirms the above findings. IMPRESSION: Widespread bilateral pulmonary infiltrates which are hazy and lower chest predominant. Findings typical of coronavirus pneumonia. No dense consolidation, lobar collapse or pleural effusion. No pulmonary emboli. Considerable aortic atherosclerosis.  Coronary artery calcification. Electronically Signed   By: Nelson Chimes M.D.   On: 12/23/2019 15:46   US RENAL  Result Date: 01/06/2020 CLINICAL DATA:  79 year old male COVID-19.  Acute kidney injury. EXAM: RENAL / URINARY TRACT ULTRASOUND COMPLETE COMPARISON:  [facility] Abdomen ultrasound 04/04/2010. CT Abdomen and Pelvis 01/08/2019. FINDINGS: Right Kidney: Renal measurements: 8.7 x 5.2 x 5.8 centimeters = volume: 137 mL. Extrarenal pelvis suspected, without strong evidence of right hydronephrosis (image 8). A right renal midpole cyst which was better demonstrated by CT might also contribute to the hypoechoic appearance at the right renal hilum (coronal image 44 on the 2020 CT). Left Kidney: Renal measurements: 9.5 x 5.7 x 4.9 centimeters = volume: 140 mL. The left kidney is less well visualized. Left extrarenal pelvis also suspected without strong evidence of left hydronephrosis (image 45). Bladder: Unable to image due to patient dressing material. Other: None. IMPRESSION: 1. Suboptimal visualization of both kidneys with no convincing hydronephrosis. Extrarenal pelves suspected (normal variant). 2. Bladder imaging  could not be performed. Electronically Signed   By: Genevie Ann M.D.   On: 01/06/2020 16:38   DG CHEST PORT 1 VIEW  Result Date: 01-16-20 CLINICAL DATA:  Acute respiratory failure with hypoxia EXAM: PORTABLE CHEST 1 VIEW COMPARISON:  Radiograph 01/05/2020 FINDINGS: Endotracheal tube terminates 3.8 cm from the carina. A transesophageal tube tip and side port distal to the GE junction. Telemetry leads overlie the chest. Surgical clips are present in the right upper quadrant. Bilateral mixed interstitial and airspace opacities are similar to comparison accounting for differences in technique and inflation. Cardiomediastinal contours are unchanged with a calcified aorta. No visible pneumothorax. There is some residual pneumomediastinum and subcutaneous emphysema of the chest wall and base of the neck. IMPRESSION: 1. No significant change in bilateral mixed interstitial and airspace opacities. 2. Stable support devices. 3. Persistent pneumomediastinum and subcutaneous emphysema of the chest wall and base of the neck. Electronically Signed   By: Lovena Le M.D.   On: January 16, 2020 06:29   DG Chest Port 1 View  Result Date: 01/05/2020 CLINICAL DATA:  Respiratory failure. EXAM: PORTABLE CHEST 1 VIEW COMPARISON:  01/04/2020 FINDINGS: Endotracheal tube tip is at the level of the clavicular heads. Bibasilar opacities are unchanged. Enteric tube tip is below the field of view. No pneumothorax or sizable pleural effusion. Cardiomediastinal contours are normal. Subcutaneous emphysema within both supraclavicular areas is unchanged. IMPRESSION: Unchanged support apparatus. Bilateral supraclavicular subcutaneous emphysema. Unchanged bibasilar opacities. Electronically Signed   By: Ulyses Jarred M.D.   On: 01/05/2020 03:21   DG CHEST PORT 1 VIEW  Result Date: 01/04/2020 CLINICAL DATA:  COVID positive, hypoxemia EXAM: PORTABLE CHEST 1 VIEW COMPARISON:  01/03/2020 FINDINGS: Endotracheal tube with the tip 4 cm above the carina.  Nasogastric tube with the tip projecting over the stomach. Persistent bilateral airspace disease most severe at the lung bases.  No definite pneumothorax. Stable heart size. Small volume pneumomediastinum. Persistent soft tissue emphysema in the neck soft tissues. No acute osseous abnormality. Bilateral patchy lower lobe airspace disease similar in appearance to the prior exam. IMPRESSION: Persistent bilateral airspace disease most severe at the lung bases. No definite pneumothorax. Small volume pneumomediastinum. Persistent soft tissue emphysema in the neck soft tissues. Electronically Signed   By: Kathreen Devoid   On: 01/04/2020 11:06   DG Chest Port 1 View  Result Date: 01/03/2020 CLINICAL DATA:  Acute respiratory failure with hypoxemia. EXAM: PORTABLE CHEST 1 VIEW COMPARISON:  01/02/2020 FINDINGS: Endotracheal tube is roughly 2 cm above the carina. Nasogastric tube extends into the abdomen. Again noted is a very small right pneumothorax that has not significantly changed. Patchy airspace densities in the mid and lower left lung. Patchy interstitial / airspace densities at the right lung base. Heart size is stable. Again noted is a large amount of subcutaneous gas and evidence for pneumomediastinum. IMPRESSION: 1. Stable chest radiograph findings. 2. Small right pneumothorax has not significantly changed. Pneumomediastinum. Diffuse subcutaneous emphysema. 3. Persistent bilateral airspace disease. 4. Support apparatuses as described. Electronically Signed   By: Markus Daft M.D.   On: 01/03/2020 08:59   DG Chest Port 1 View  Result Date: 01/02/2020 CLINICAL DATA:  Right pneumothorax. EXAM: PORTABLE CHEST 1 VIEW COMPARISON:  CT 01/01/2020.  Chest x-ray 01/01/2020. FINDINGS: Endotracheal tube and NG tube in stable position. Stable tiny right apical pneumothorax. Persistent unchanged pneumomediastinum again noted. Persistent chest wall and bilateral neck subcutaneous emphysema, slight progression. Persistent  multifocal bilateral pulmonary infiltrates without significant interim change. No pleural effusion. Heart size stable. Surgical clips right upper quadrant. IMPRESSION: 1.  Endotracheal tube and NG tube in stable position. 2. Tiny right apical pneumothorax again noted. Persistent unchanged pneumomediastinum again noted. Persistent chest wall and bilateral neck subcutaneous emphysema with slight progression noted. 3. Persistent multifocal bilateral pulmonary infiltrates without significant interim change. Electronically Signed   By: Marcello Moores  Register   On: 01/02/2020 09:04   Portable Chest x-ray  Result Date: 01/01/2020 CLINICAL DATA:  Intubation, COVID-19 EXAM: PORTABLE CHEST 1 VIEW COMPARISON:  Portable exam 6 0731 hours compared to 06/25/2020 FINDINGS: Endotracheal tube tip at carina recommend withdrawal 2 cm. Normal heart size, mediastinal contours, and pulmonary vascularity. Pneumomediastinum is present extending into the cervical region bilaterally with additional gas at the RIGHT axilla. Tiny RIGHT pneumothorax along lateral RIGHT chest wall. Progressive consolidation of LEFT lower lobe and at RIGHT lung base. No pleural effusions or LEFT pneumothorax. IMPRESSION: Recommend withdrawal of endotracheal tube 2 cm. Progressive bibasilar infiltrates. Tiny RIGHT pneumothorax with mild pneumomediastinum and additional soft tissue gas in the cervical regions bilaterally as well as RIGHT axilla. Findings called to [place] in [facility] on 01/01/2020 at 0818 hrs. Electronically Signed   By: Lavonia Dana M.D.   On: 01/01/2020 08:19   DG Chest Port 1 View  Result Date: 12/27/2019 CLINICAL DATA:  Hypoxia. EXAM: PORTABLE CHEST 1 VIEW COMPARISON:  December 22, 2019. FINDINGS: The heart size and mediastinal contours are within normal limits. No pneumothorax or pleural effusion is noted. Increased bilateral basilar opacities are noted concerning for worsening infiltrates or atelectasis. The visualized skeletal structures  are unremarkable. IMPRESSION: Increased bilateral basilar opacities are noted concerning for worsening infiltrates or atelectasis. Electronically Signed   By: Marijo Conception M.D.   On: 12/27/2019 11:54   DG Chest Portable 1 View  Result Date: 01/01/2020 CLINICAL DATA:  Cough and shortness  of breath. EXAM: PORTABLE CHEST 1 VIEW COMPARISON:  Chest x-ray dated January 08, 2019. FINDINGS: The heart size and mediastinal contours are within normal limits. Normal pulmonary vascularity. Patchy opacities at the left lung base. No pleural effusion or pneumothorax. No acute osseous abnormality. IMPRESSION: 1. Left lower lobe pneumonia. Electronically Signed   By: Titus Dubin M.D.   On: 12/28/2019 10:41   EEG adult  Result Date: 01/05/2020 Lora Havens, MD     01/05/2020 10:45 AM Patient Name: GRAESYN RUSZCZYK MRN: [id number] Epilepsy Attending: Lora Havens Referring Physician/Provider: Dr Simonne Maffucci Date: 01/05/2020 Duration: 23.50 mins Patient history: 79 yo M COVID + with ams, concern for anoxic injury. EEG to evaluate for seizure. Level of alertness: comatose AEDs during EEG study: None Technical aspects: This EEG study was done with scalp electrodes positioned according to the 10-20 International system of electrode placement. Electrical activity was acquired at a sampling rate of 500Hz  and reviewed with a high frequency filter of 70Hz  and a low frequency filter of 1Hz . EEG data were recorded continuously and digitally stored. DESCRIPTION: EEG showed continuous generalized 3-6hz  theta-delta slowing. EEG was reactive to tactile stimuli. Hyperventilation and photic stimulation were not performed. ABNORMALITY - Continuous slow, generalized IMPRESSION: This study is suggestive of severe diffuse encephalopathy, non specific to etiology. No seizures or epileptiform discharges were seen throughout the recording. Lora Havens    Microbiology Recent Results (from the past 240 hour(s))  Culture,  respiratory (non-expectorated)     Status: Abnormal   Collection Time: 01/01/20  9:33 AM   Specimen: Endotracheal; Respiratory  Result Value Ref Range Status   Specimen Description   Final    ENDOTRACHEAL Performed at [facility] [address], [place], [place] [address]    Special Requests   Final    NONE Performed at [facility], [place] [address], [place], [place] [address]    Gram Stain   Final    ABUNDANT WBC PRESENT, PREDOMINANTLY MONONUCLEAR ABUNDANT YEAST ABUNDANT GRAM POSITIVE COCCI FEW GRAM NEGATIVE RODS FEW GRAM POSITIVE RODS Performed at [facility], [place] [address], [place],  [address]    Culture (A)  Final    STENOTROPHOMONAS MALTOPHILIA STAPHYLOCOCCUS EPIDERMIDIS FEW CANDIDA ALBICANS    Report Status 01-19-20 FINAL  Final   Organism ID, Bacteria STENOTROPHOMONAS MALTOPHILIA  Final   Organism ID, Bacteria STAPHYLOCOCCUS EPIDERMIDIS  Final      Susceptibility   Staphylococcus epidermidis - MIC*    CIPROFLOXACIN >=8 RESISTANT Resistant     ERYTHROMYCIN >=8 RESISTANT Resistant     GENTAMICIN <=0.5 SENSITIVE Sensitive     OXACILLIN >=4 RESISTANT Resistant     TETRACYCLINE >=16 RESISTANT Resistant     VANCOMYCIN 2 SENSITIVE Sensitive     TRIMETH/SULFA <=10 SENSITIVE Sensitive     CLINDAMYCIN >=8 RESISTANT Resistant     RIFAMPIN <=0.5 SENSITIVE Sensitive     Inducible Clindamycin NEGATIVE Sensitive     * STAPHYLOCOCCUS EPIDERMIDIS   Stenotrophomonas maltophilia - MIC*    LEVOFLOXACIN 1 SENSITIVE Sensitive     TRIMETH/SULFA <=20 SENSITIVE Sensitive     * STENOTROPHOMONAS MALTOPHILIA  MRSA PCR Screening     Status: None   Collection Time: 01/03/20  1:00 AM   Specimen: Nasal Mucosa; Nasopharyngeal  Result Value Ref Range Status   MRSA by PCR NEGATIVE NEGATIVE Final    Comment:        The GeneXpert MRSA Assay (FDA approved for NASAL specimens  only), is one component of a comprehensive MRSA  colonization surveillance program. It is not intended to diagnose MRSA infection nor to guide or monitor treatment for MRSA infections. Performed at [facility], [place] [address], [place], [place] [address]     Lab Basic Metabolic Panel: Recent Labs  Lab 01/01/20 1651 01/02/20 0500 01/02/20 0500 01/02/20 1656 01/03/20 0000 01/03/20 0000 01/04/20 0856 01/05/20 0550 01/06/20 0602 01/21/2020 0525 01/21/2020 1144  NA  --  133*   < >  --  135   < > 136 139 141 142 142  K  --  4.8   < >  --  4.6   < > 4.6 4.8 5.0 4.7 5.2*  CL  --  101   < >  --  102  --  107 110 113* 112*  --   CO2  --  21*   < >  --  21*  --  18* 18* 18* 18*  --   GLUCOSE  --  160*   < >  --  122*  --  196* 149* 243* 190*  --   BUN  --  59*   < >  --  75*  --  86* 93* 94* 122*  --   CREATININE  --  1.37*   < >  --  1.93*  --  2.00* 2.32* 2.30* 2.88*  --   CALCIUM  --  7.9*   < >  --  7.8*  --  7.5* 7.5* 7.7* 7.5*  --   MG 2.1 2.2  --  2.1  --   --   --   --   --   --   --   PHOS 4.9* 4.1  --  3.1  --   --   --   --   --   --   --    < > = values in this interval not displayed.   Liver Function Tests: Recent Labs  Lab 01/02/20 0500 01/03/20 0000 01/04/20 0856 01/05/20 0550  AST 25 22 25 28   ALT 45* 40 41 55*  ALKPHOS 122 110 98 86  BILITOT 0.6 0.7 0.6 0.7  PROT 5.8* 5.6* 5.0* 5.2*  ALBUMIN 2.4* 2.2* 1.9* 1.8*   No results for input(s): LIPASE, AMYLASE in the last 168 hours. No results for input(s): AMMONIA in the last 168 hours. CBC: Recent Labs  Lab 01/03/20 0000 01/03/20 0000 01/04/20 0856 01/05/20 0940 01/06/20 0602 2020/01/21 0525 01-21-2020 1144  WBC 29.3*  --  28.9* 15.6* 15.8* 20.4*  --   NEUTROABS 26.6*  --  26.5*  --   --   --   --   HGB 11.9*   < > 11.4* 10.5* 10.0* 10.7* 9.9*  HCT 35.8*   < > 33.9* 31.2* 30.5* 32.3* 29.0*  MCV 89.3  --  89.7 90.7 92.7 90.7  --   PLT 208  --  161 138* 165 213  --    < > = values in this interval not displayed.   Cardiac Enzymes: No  results for input(s): CKTOTAL, CKMB, CKMBINDEX, TROPONINI in the last 168 hours. Sepsis Labs: Recent Labs  Lab 01/04/20 0856 01/05/20 0940 01/06/20 0602 2020/01/21 0525  WBC 28.9* 15.6* 15.8* 20.4*    Procedures/Operations  See chrt    Audria Nine 01/08/2020, 1:14 PM

## 2020-01-12 NOTE — Progress Notes (Signed)
ABG results given to Dr Ruthann Cancer. No new orders received at this time.

## 2020-01-12 NOTE — Progress Notes (Signed)
NAME:  Terrence Cisneros, MRN:  [id number], DOB:  Dec 27, 1940, LOS: 16 ADMISSION DATE:  12/27/2019, CONSULTATION DATE:  1/22 REFERRING MD:  Terrence Cisneros, CHIEF COMPLAINT:  dyspnea   Brief History   79 y/o male presented with nausea, dyspnea, cough.  Didn't improve with outpt antibiotic and prednisone. Found to have hypoxia from COVID 19 pneumonia.  Reports having exposure to COVID during Christmas. Noted to have elevated procalcitonin also and treated with ABX for CAP.  Had transient A fib with RVR. Developed worsening hypoxia with altered mental status 1/21 and transferred to ICU.  Past Medical History  Hypothyroidism, OA, CKD 2, Allergies, HTN, DM, Neuropathy  Significant Hospital Events   1/11 Admit 1/13 A fib with RVR, add amiodarone 1/17 d/c amiodarone and start lopressor 1/20 transition from lovenox to eliquis 1/21 worsening hypoxia with altered mental status >> to ICU and intubated 1/25 Weaning but mental status barrier to extubation. EEG performed, severe diffuse encephalopathy 1/27 Worsening O2 needs, AKI, respiratory distress despite vent support / sedation  Consults:  PCCM  Procedures:  1/21 ETT >>   Significant Diagnostic Tests:  CT angio chest 1/12 >> b/l GGO, atherosclerosis CT head 1/21 >> retropharyngeal gas, NAICP CT chest 1/21 >> small right sided pneumothorax, extensive pneumomediastinum, bilatearl infiltrates consistent with COVID 19 EEG 1/25 >> study suggestive of severe diffuse encephalopathy, non-specific etiology  Micro Data:  SARS CoV2 Ag 1/11 >> Positive Sputum 1/21 >> yeast, stenotrophomonas maltophilia >> S-levaquin        staph epidermis >> S-vanco, bactrim   Antimicrobials:  Decadron 1/11 > 1/24 Remdesivir 1/11 >> 1/15 Convalescent plasma 1/14 >> Rocephin 1/11 >> 1/15 Zithromax 1/11 >> 1/15    Vanco 1/24 >>  Levaquin 1/24 >>   Interim history/subjective:  PEEP 10, FiO2 100% Glucose range 120 -304 Tmax 99.1, WBC increased to 20k I/O 976ml UOP,  1.1L+ in last 24h RN reports pt breathing pattern has worsened this am, tachypnea.  BP remains relatively stable.  Not on pressors.   Objective   Blood pressure (!) 154/72, pulse (!) 139, temperature 99.1 F (37.3 C), temperature source Axillary, resp. rate (!) 45, height 5\' 8"  (1.727 m), weight 76 kg, SpO2 (!) 89 %.    Vent Mode: PRVC FiO2 (%):  [50 %-100 %] 100 % Set Rate:  [16 bmp] 16 bmp Vt Set:  [480 mL] 480 mL PEEP:  [5 cmH20-10 cmH20] 10 cmH20 Plateau Pressure:  [24 cmH20-30 cmH20] 30 cmH20   Intake/Output Summary (Last 24 hours) at 12-Jan-2020 [id number] Last data filed at 01/06/2020 1900 Gross per 24 hour  Intake 1006.21 ml  Output 450 ml  Net 556.21 ml   Filed Weights   01/05/20 1200 01/06/20 0131 2020-01-12 0500  Weight: 75.4 kg 75.9 kg 76 kg    Examination: General: critically ill appearing frail elderly adult male lying in bed, appears uncomfortable  HEENT: MM pink/dry, dried bloody secretions at lips, ETT, pupils 2-37mm equal  Neuro: no response to verbal stimuli, + cough with deep suction CV: s1s2 RRR, tachy in 120's to 140's, no m/r/g PULM: tachypnea, accessory muscle use, equal breath sounds bilaterally  GI: soft, bsx4 hypoactive  Extremities: cool/dry, LE mottling, varicose veins,no edema  Skin: no rashes or lesions  CXR 1/27 >> images personally reviewed, bilateral opacities, persistent pneumomediastinum and SQ emphysema of the chest wall / neck- no pneumothorax, ETT in good position   Resolved Hospital Problem list     Assessment & Plan:   ARDS secondary to COVID  19 Completed remdesivir, steroids.  -assess ABG now -at bedside, attempted other modes of ventilation without change to patients respiratory pattern -low Vt ventilation 4-8cc/kg -Daily PSV as tolerated -goal plateau pressure <30, driving pressure [id number] cm H2O -target PaO2 55-65, titrate PEEP/FiO2 per ARDS protocol  -goal CVP <4, diuresis as necessary -VAP prevention measures  -follow intermittent  CXR   Stenotrophomonas, Staph Epidermis HCAP -sensitivities reviewed, continue vanco, levaquin   Pneumomediastinum Small R pneumothorax -follow intermittent CXR  Need for sedation -PRN low dose fentanyl for pain -minimize sedation as able    AKI Acute Metabolic Acidosi  Hx of Renal Stones FENa suggestive of pre-renal failure.  Worsening sr cr this am.   -now bicarb push  -Trend BMP / urinary output -Replace electrolytes as indicated -Avoid nephrotoxic agents, ensure adequate renal perfusion -no indication for HD at this time -LR at 53ml/hr  Acute toxic metabolic encephalopathy Worrisome for anoxic injury given hypoxemia on 1/21 AM. Previously discussed with Neuro. Additional factors include AKI / possible poor renal clearance of drugs.  EEG with severe diffuse encephalopathy but no epileptiform activity.   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 -tele monitoring  -heparin per pharmacy  -continue metoprolol  DM2 -SSI, moderate scale  -continue levemir 7 units QD -increase TF coverage to 4 units Q4  Anemia of chronic disease Mild Thrombocytopenia -monitor for further bleeding  -transfuse for Hgb <7% -continue ASA, heparin gtt  Cold feet 1/23, good cap refill, + doppler pulses  -warming measures and follow clinical exam    Best practice:  Diet: tube feeding  Pain/Anxiety/Delirium protocol (if indicated): yes, RASS target 0 to -1 VAP protocol (if indicated): yes DVT prophylaxis: Eliquis GI prophylaxis: famotidine Glucose control: SSI Mobility: bed rest Code Status: full Family Communication: Terrence Cisneros (wife) updated 1/27 via phone per Dr. Ruthann Cancer.  Unfortunately, [name] has declined this am despite aggressive support. His family is planning to come to the hospital to say good bye and then will proceed with comfort measures.   Disposition: ICU  Labs   CBC: Recent  Labs  Lab 01/03/20 0000 01/04/20 0856 01/05/20 0940 01/06/20 0602 02-05-20 0525  WBC 29.3* 28.9* 15.6* 15.8* 20.4*  NEUTROABS 26.6* 26.5*  --   --   --   HGB 11.9* 11.4* 10.5* 10.0* 10.7*  HCT 35.8* 33.9* 31.2* 30.5* 32.3*  MCV 89.3 89.7 90.7 92.7 90.7  PLT 208 161 138* 165 [id number]    Basic Metabolic Panel: Recent Labs  Lab 01/01/20 0615 01/01/20 0615 01/01/20 0826 01/01/20 1651 01/02/20 0500 01/02/20 0500 01/02/20 1656 01/03/20 0000 01/04/20 0856 01/05/20 0550 01/06/20 0602 02-05-20 0525  NA 134*   < >   < >  --  133*   < >  --  135 136 139 141 142  K 4.8   < >   < >  --  4.8   < >  --  4.6 4.6 4.8 5.0 4.7  CL 99   < >  --   --  101   < >  --  102 107 110 113* 112*  CO2 22   < >  --   --  21*   < >  --  21* 18* 18* 18* 18*  GLUCOSE 110*   < >  --   --  160*   < >  --  122* 196* 149* 243*  190*  BUN 48*   < >  --   --  59*   < >  --  75* 86* 93* 94* 122*  CREATININE 1.21   < >  --   --  1.37*   < >  --  1.93* 2.00* 2.32* 2.30* 2.88*  CALCIUM 8.4*   < >  --   --  7.9*   < >  --  7.8* 7.5* 7.5* 7.7* 7.5*  MG 2.2  --   --  2.1 2.2  --  2.1  --   --   --   --   --   PHOS 4.1  --   --  4.9* 4.1  --  3.1  --   --   --   --   --    < > = values in this interval not displayed.   GFR: Estimated Creatinine Clearance: 20.5 mL/min (A) (by C-G formula based on SCr of 2.88 mg/dL (H)). Recent Labs  Lab 01/04/20 0856 01/05/20 0940 01/06/20 0602 2020/01/09 0525  WBC 28.9* 15.6* 15.8* 20.4*    Liver Function Tests: Recent Labs  Lab 01/01/20 0615 01/02/20 0500 01/03/20 0000 01/04/20 0856 01/05/20 0550  AST 29 25 22 25 28   ALT 49* 45* 40 41 55*  ALKPHOS 139* 122 110 98 86  BILITOT 0.9 0.6 0.7 0.6 0.7  PROT 6.0* 5.8* 5.6* 5.0* 5.2*  ALBUMIN 2.6* 2.4* 2.2* 1.9* 1.8*   No results for input(s): LIPASE, AMYLASE in the last 168 hours. No results for input(s): AMMONIA in the last 168 hours.  ABG    Component Value Date/Time   PHART 7.363 01/01/2020 0826   PCO2ART 37.3  01/01/2020 0826   PO2ART 170.0 (H) 01/01/2020 0826   HCO3 21.2 01/01/2020 0826   TCO2 22 01/01/2020 0826   ACIDBASEDEF 4.0 (H) 01/01/2020 0826   O2SAT 99.0 01/01/2020 0826     Coagulation Profile: No results for input(s): INR, PROTIME in the last 168 hours.  Cardiac Enzymes: No results for input(s): CKTOTAL, CKMB, CKMBINDEX, TROPONINI in the last 168 hours.  HbA1C: Hgb A1c MFr Bld  Date/Time Value Ref Range Status  12/23/2019 11:05 AM 7.4 (H) 4.8 - 5.6 % Final    Comment:    (NOTE) Pre diabetes:          5.7%-6.4% Diabetes:              >6.4% Glycemic control for   <7.0% adults with diabetes   06/11/2019 08:56 AM 6.4 (H) 4.8 - 5.6 % Final    Comment:    (NOTE) Pre diabetes:          5.7%-6.4% Diabetes:              >6.4% Glycemic control for   <7.0% adults with diabetes     CBG: Recent Labs  Lab 01/06/20 1532 01/06/20 1924 01/06/20 2344 Jan 09, 2020 0407 01-09-20 0733  GLUCAP 300* 301* 304* 203* 120*       Critical care time: 61 minutes      Noe Gens, MSN, NP-C [facility] 01-09-2020, 9:16 AM   Please see [email or web address] for pager details.

## 2020-01-12 NOTE — Progress Notes (Signed)
ANTICOAGULATION CONSULT NOTE - Follow Up Consult  Pharmacy Consult for Heparin Indication: atrial fibrillation  Allergies  Allergen Reactions  . Lisinopril Other (See Comments)    BP dropped very low    Patient Measurements: Height: 5\' 8"  (172.7 cm) Weight: 167 lb 8.8 oz (76 kg) IBW/kg (Calculated) : 68.4 Heparin Dosing Weight: total weight  Vital Signs: Temp: 98.9 F (37.2 C) (01/27 0400) Temp Source: Oral (01/27 0400) BP: 160/74 (01/27 0700) Pulse Rate: 127 (01/27 0703)  Labs: Recent Labs    01/04/20 0856 01/05/20 0550 01/05/20 0940 01/05/20 2005 01/06/20 0602 01/06/20 1755 01-27-20 0525  HGB   < >  --  10.5*  --  10.0*  --  10.7*  HCT   < >  --  31.2*  --  30.5*  --  32.3*  PLT   < >  --  138*  --  165  --  213  APTT   < > 199* 152*   < > 65* 51* 57*  HEPARINUNFRC  --  >2.20*  --   --  1.32*  --  1.03*  CREATININE  --  2.32*  --   --  2.30*  --  2.88*   < > = values in this interval not displayed.    Estimated Creatinine Clearance: 20.5 mL/min (A) (by C-G formula based on SCr of 2.88 mg/dL (H)).   Medications:  Scheduled:  . vitamin C  500 mg Per Tube Daily  . aspirin EC  81 mg Oral QODAY  . chlorhexidine  15 mL Mouth/Throat BID  . Chlorhexidine Gluconate Cloth  6 each Topical Daily  . famotidine  20 mg Per Tube Daily  . feeding supplement (PRO-STAT SUGAR FREE 64)  30 mL Per Tube Daily  . insulin aspart  0-15 Units Subcutaneous Q4H  . insulin aspart  3 Units Subcutaneous Q4H  . insulin detemir  7 Units Subcutaneous Daily  . mouth rinse  15 mL Mouth Rinse 10 times per day  . metoprolol tartrate  50 mg Per Tube BID  . pneumococcal 23 valent vaccine  0.5 mL Intramuscular Tomorrow-1000  . vancomycin variable dose per unstable renal function (pharmacist dosing)   Does not apply See admin instructions  . zinc sulfate  220 mg Per Tube Daily   Infusions:  . feeding supplement (VITAL AF 1.2 CAL) 1,000 mL (01/06/20 0646)  . heparin 1,000 Units/hr (01/06/20  1922)  . lactated ringers 30 mL/hr at 01/06/20 0700  . levofloxacin (LEVAQUIN) IV 750 mg (01/06/20 0806)  . vancomycin 1,000 mg (01/06/20 2002)    Assessment: 65 YOM admitted 1/11 with COVID-19 pneumonia started on Lovenox (1/13) then apixaban (1/19) for new AFib.  Due to patient's renal fx worsening, pharmacy is now consulted to switch patient to IV heparin. Last dose of apixaban was 1/23 at 2131.   D-dimer increased to >20 (01/02/20)  Today, 2020-01-27:  Heparin level 1.03, falsely elevated as expected d/t recent apixaban use.  Monitor aPTT for heparin dosing until both levels correlate.  APTT remains low despite rate increase on heparin infusion rate of 1000 units/hr for lower goal d/t bleeding.    CBC:  Hgb remains low/stable at 10.7, Plt improved to 213  Bleeding:  Minor oral bleeding.  RN reports urethral bleeding remains the same with wine-colored urine.  No major changes per Su Grand, will monitor closely for any changes.  Discussed with Noe Gens, NP.  Goal of Therapy:  Heparin level 0.3-0.7 units/ml aPTT 66-102 seconds Monitor  platelets by anticoagulation protocol: Yes   Plan:  Increase to heparin IV infusion at 1100 units/hr APTT 8 hours after rate change. Daily aPTT, heparin level, and CBC Continue to monitor H&H and platelets   Gretta Arab PharmD, BCPS Clinical pharmacist phone 7am- 5pm: [phone] 01/28/20 7:04 AM

## 2020-01-12 NOTE — Progress Notes (Signed)
  Nutrition Brief Note  Chart reviewed. Pt now transitioning to comfort care.  No further nutrition interventions warranted at this time.  Please re-consult as needed.   [employer] MS, RDN, LDN, CNSC [phone] Pager  [phone] Weekend/On-Call Pager

## 2020-01-12 NOTE — Progress Notes (Signed)
Pt extubated at 63 per physician order/family request. Pt extubated to comfort care. RT and RN remained at bedside.

## 2020-01-12 NOTE — Progress Notes (Signed)
Pt transported on ventilator with RN from [facility] 208-3 with the RN. Pt transported to family comfort care room and back. VS remained acceptable during transport to comfort room and back to the ICU. RT will continue to monitor and plan for compassionate extubation when appropriate.

## 2020-01-12 DEATH — deceased

## 2022-01-05 IMAGING — US US RENAL
1 series · 14 of 25 positions shown · non-contrast
Comparison: [facility]ir[facility] Amazigh Abdomen ultrasound 04/04/2010. CT
Abdomen and Pelvis 01/08/2019.

CLINICAL DATA: 78-year-old male [id number].  Acute kidney injury.

EXAM:
RENAL / URINARY TRACT ULTRASOUND COMPLETE

[Series 1: us renal · 0.24mm/px · 14 of 47 slices shown]
[im 1/47]
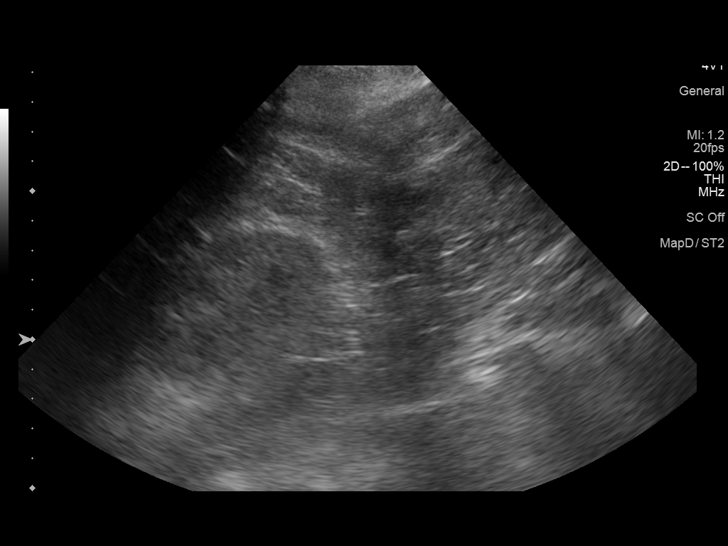
[im 4/47]
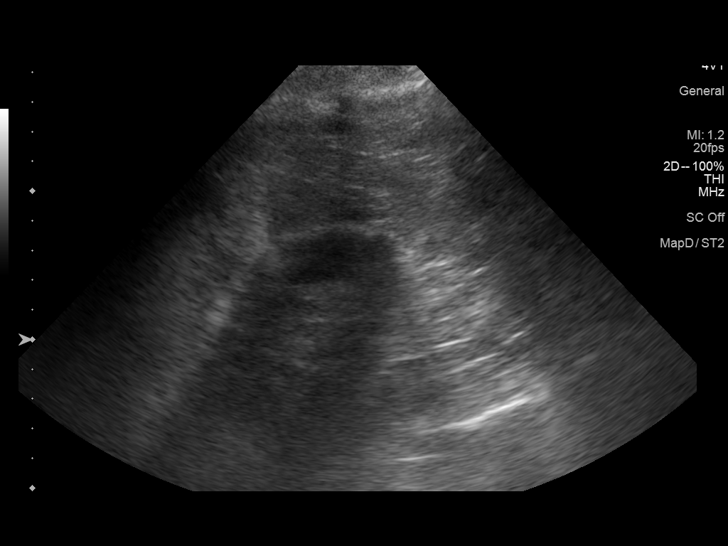
[im 8/47]
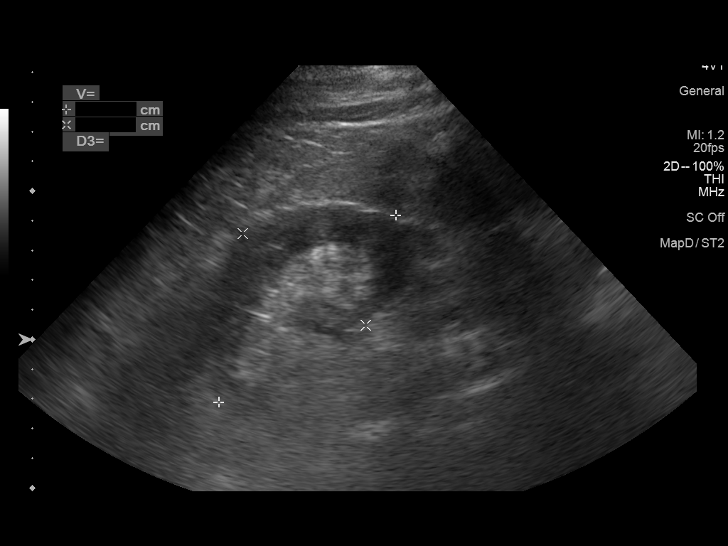
[im 12/47]
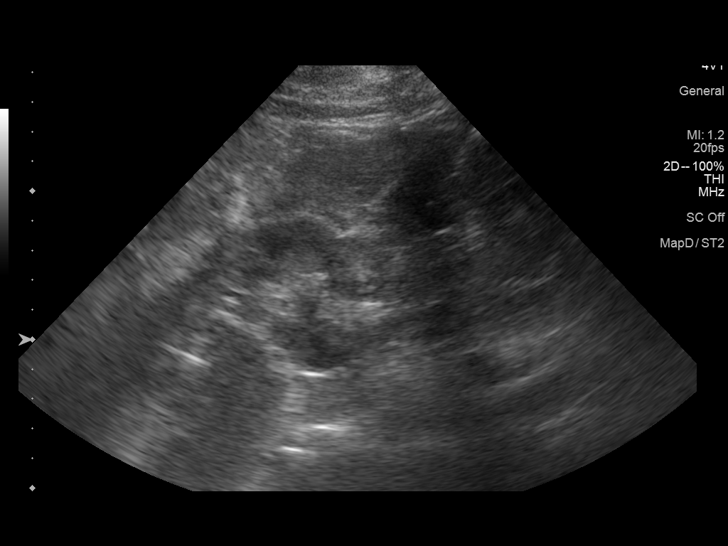
[im 16/47]
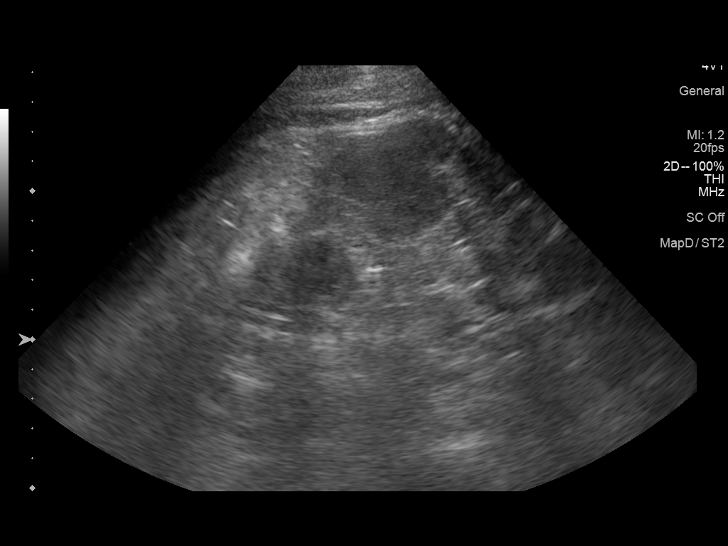
[im 18/47]
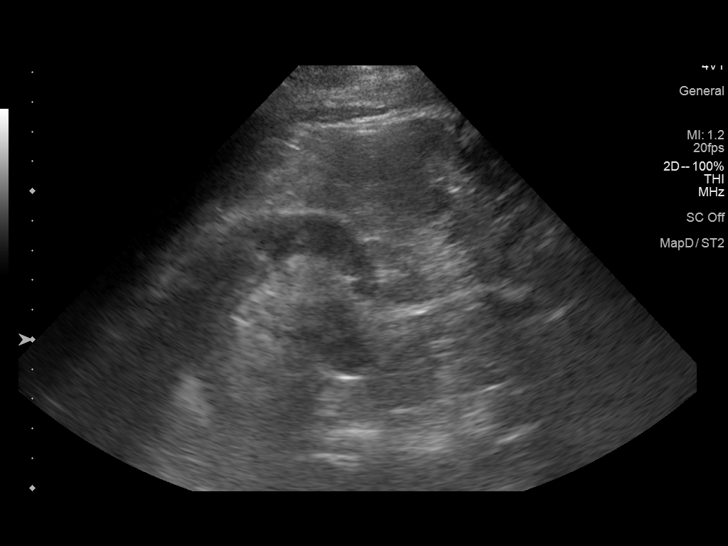
[im 22/47]
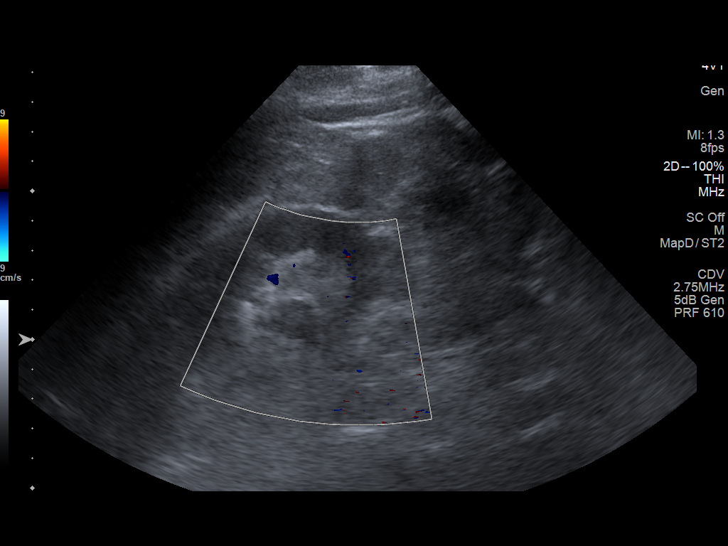
[im 25/47]
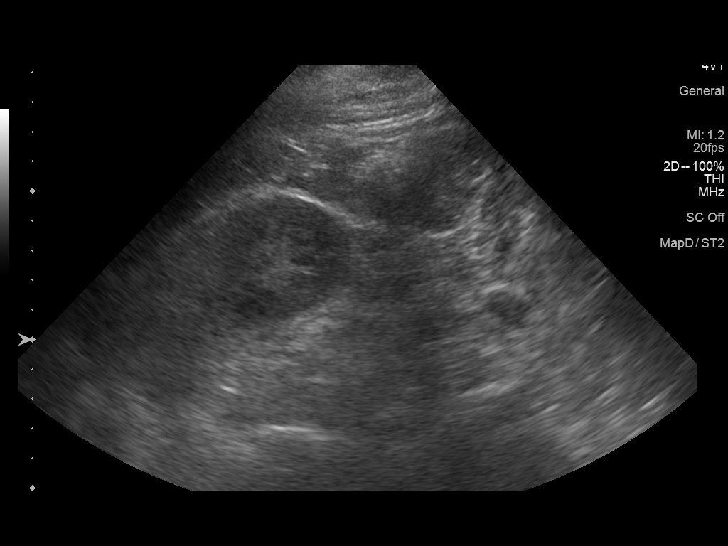
[im 29/47]
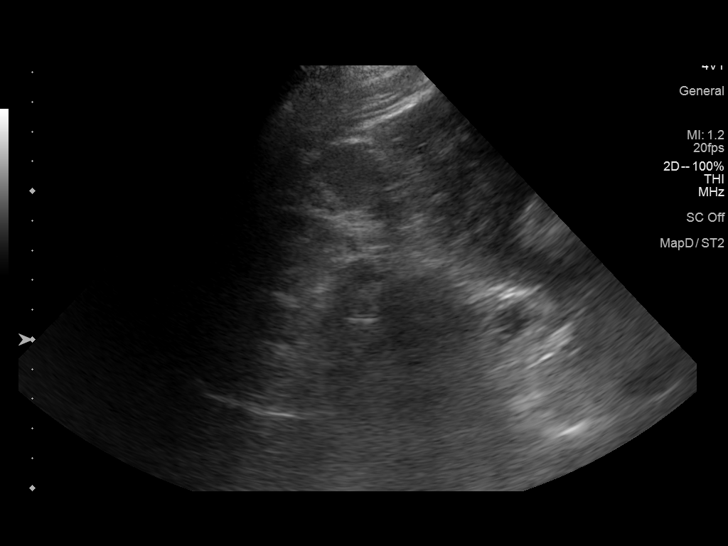
[im 31/47]
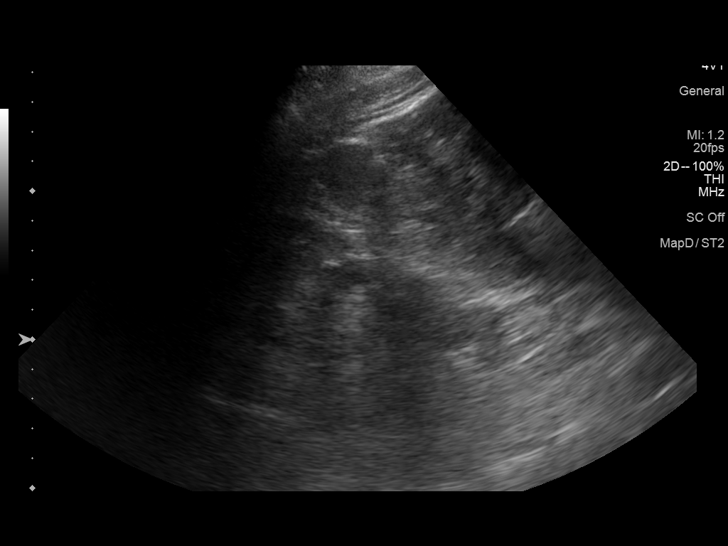
[im 35/47]
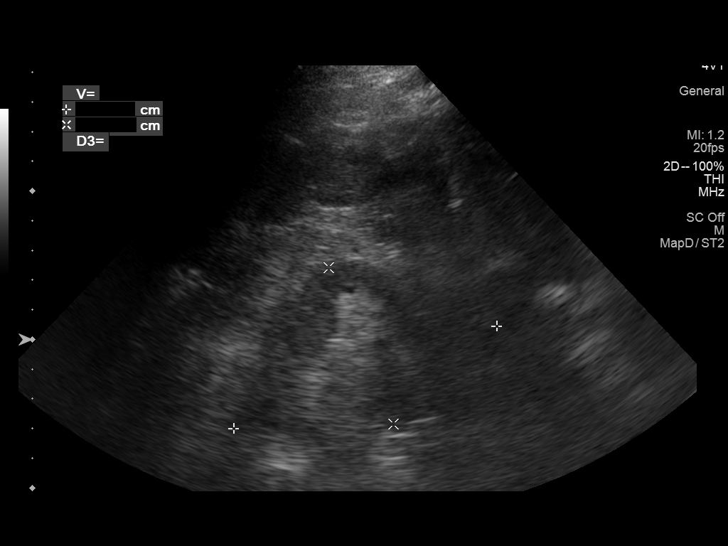
[im 39/47]
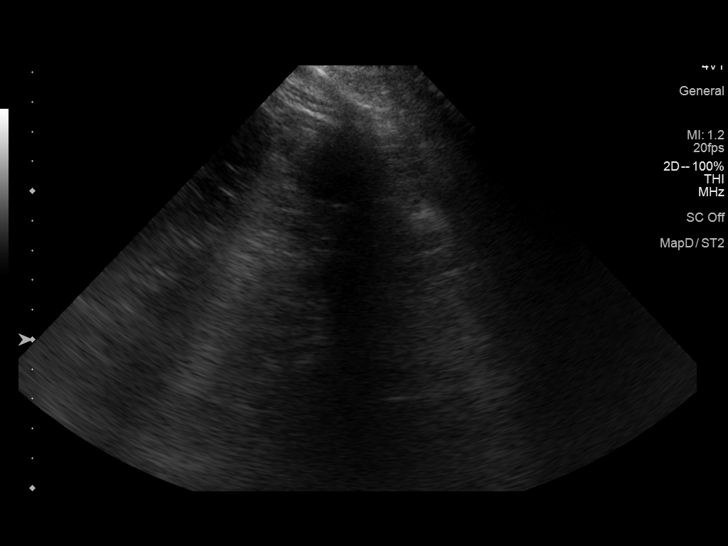
[im 43/47]
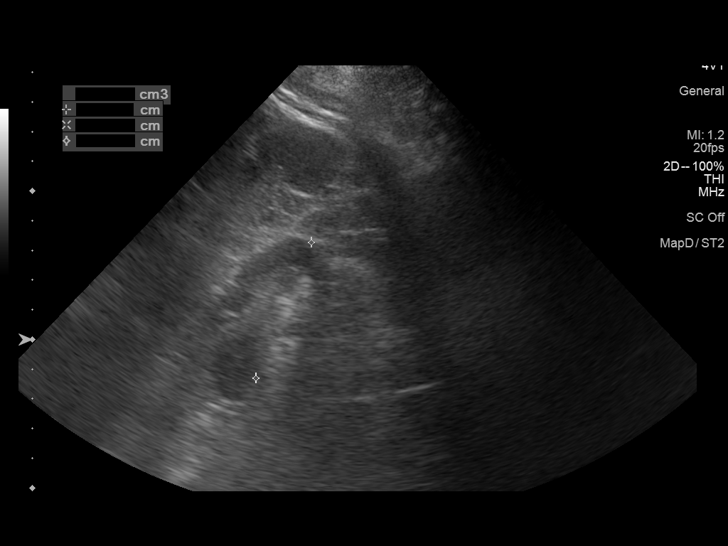
[im 47/47]
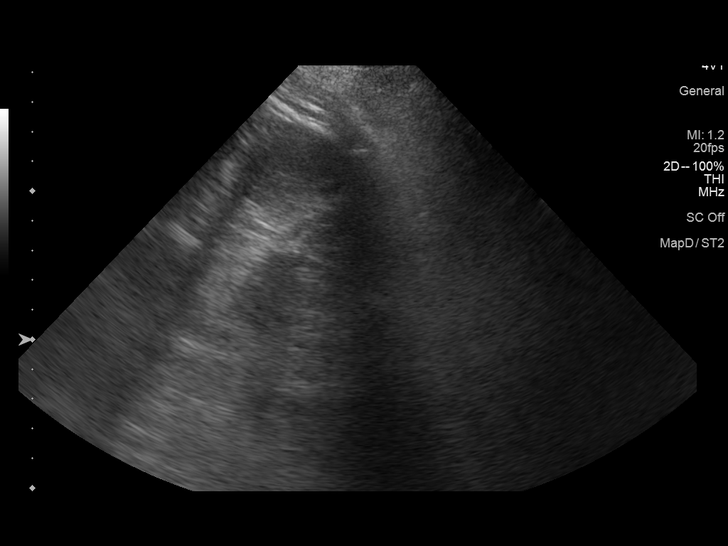

[14 of 25 positions shown; findings below may reference images not displayed]

FINDINGS: Right Kidney:

Renal measurements: 8.7 x 5.2 x 5.8 centimeters = volume: 137 mL.
Extrarenal pelvis suspected, without strong evidence of right
hydronephrosis (image 8). A right renal midpole cyst which was
better demonstrated by CT might also contribute to the hypoechoic
appearance at the right renal hilum (coronal image 44 on the 6060
CT).

Left Kidney:

Renal measurements: 9.5 x 5.7 x 4.9 centimeters = volume: 140 mL.
The left kidney is less well visualized. Left extrarenal pelvis also
suspected without strong evidence of left hydronephrosis (image 45).

Bladder:

Unable to image due to patient dressing material.

Other:

None.
IMPRESSION: 1. Suboptimal visualization of both kidneys with no convincing
hydronephrosis. Extrarenal pelves suspected (normal variant).
2. Bladder imaging could not be performed.
